# Patient Record
Sex: Female | Born: 1965 | Race: White | Hispanic: No | Marital: Married | Smoking: Never smoker
Health system: Southern US, Community
[De-identification: ages and names within clinical notes are randomized; demographics above are authoritative.]

## PROBLEM LIST (undated history)

## (undated) DIAGNOSIS — E559 Vitamin D deficiency, unspecified: Secondary | ICD-10-CM

## (undated) DIAGNOSIS — C55 Malignant neoplasm of uterus, part unspecified: Secondary | ICD-10-CM

## (undated) DIAGNOSIS — E785 Hyperlipidemia, unspecified: Secondary | ICD-10-CM

## (undated) DIAGNOSIS — I639 Cerebral infarction, unspecified: Secondary | ICD-10-CM

## (undated) DIAGNOSIS — I10 Essential (primary) hypertension: Secondary | ICD-10-CM

## (undated) HISTORY — DX: Essential (primary) hypertension: I10

## (undated) HISTORY — PX: BREAST LUMPECTOMY: SHX2

## (undated) HISTORY — DX: Vitamin D deficiency, unspecified: E55.9

## (undated) HISTORY — PX: BREAST EXCISIONAL BIOPSY: SUR124

## (undated) HISTORY — PX: ABDOMINAL HYSTERECTOMY: SHX81

## (undated) HISTORY — PX: LEFT OOPHORECTOMY: SHX1961

## (undated) HISTORY — DX: Malignant neoplasm of uterus, part unspecified: C55

## (undated) HISTORY — DX: Hyperlipidemia, unspecified: E78.5

## (undated) HISTORY — DX: Cerebral infarction, unspecified: I63.9

---

## 1985-05-15 DIAGNOSIS — C55 Malignant neoplasm of uterus, part unspecified: Secondary | ICD-10-CM

## 1985-05-15 HISTORY — DX: Malignant neoplasm of uterus, part unspecified: C55

## 2002-04-29 ENCOUNTER — Encounter: Payer: Self-pay | Admitting: Internal Medicine

## 2002-04-29 ENCOUNTER — Emergency Department (HOSPITAL_COMMUNITY): Admission: EM | Admit: 2002-04-29 | Discharge: 2002-04-29 | Payer: Self-pay | Admitting: Emergency Medicine

## 2012-06-25 ENCOUNTER — Encounter: Payer: Self-pay | Admitting: Gastroenterology

## 2012-07-08 ENCOUNTER — Telehealth: Payer: Self-pay | Admitting: Gastroenterology

## 2012-07-08 NOTE — Telephone Encounter (Signed)
Pt requesting to be seen sooner. Pt is having a lot of nausea and abdominal pain. Pt scheduled to see Dr. Arlyce Dice 07/11/12@9 :30am. Pt aware of appt date and time.

## 2012-07-11 ENCOUNTER — Ambulatory Visit (INDEPENDENT_AMBULATORY_CARE_PROVIDER_SITE_OTHER): Payer: PRIVATE HEALTH INSURANCE | Admitting: Gastroenterology

## 2012-07-11 ENCOUNTER — Encounter: Payer: Self-pay | Admitting: Gastroenterology

## 2012-07-11 VITALS — BP 138/96 | HR 80 | Ht 65.0 in | Wt 173.4 lb

## 2012-07-11 MED ORDER — OMEPRAZOLE 20 MG PO CPDR: 20.0000 mg | DELAYED_RELEASE_CAPSULE | Freq: Every day | ORAL | Status: DC

## 2012-07-11 MED ORDER — ONDANSETRON HCL 4 MG PO TABS: 4.0000 mg | ORAL_TABLET | Freq: Three times a day (TID) | ORAL | Status: DC | PRN

## 2012-07-11 NOTE — Patient Instructions (Addendum)
You have been scheduled for an abdominal ultrasound at [facility] Radiology (1st floor of hospital) on 08/09/2012 at 10:30am. Please arrive 15 minutes prior to your appointment for registration. Make certain not to have anything to eat or drink 6 hours prior to your appointment. Should you need to reschedule your appointment, please contact radiology at [phone]. This test typically takes about 30 minutes to perform.  Your prescription will be sent to your pharmacy

## 2012-07-11 NOTE — Progress Notes (Signed)
History of Present Illness: Pleasant 47 year old white female referred at the request of Dr. Charm Barges for evaluation of abdominal pain. Approximately 3 weeks ago she developed severe upper abdominal pain associated with nausea. Pain radiated slightly to the back. She was seen at [facility] ER where CT scan was unrevealing. Lab work was pertinent for white count of 13,000. LFTs were normal. She was given a GI cocktail and pain subsided over the next 48 hours. Associated with pain and nausea has been diarrhea. She tends to be constipated. Approximately week ago she had another episode of severe pain. This subsided spontaneously but she's had ongoing nausea since that time. Nausea is exacerbated by eating, but is chronic. She's had no fever or vomiting. There's been no change in her medications. She denies pyrosis. She's on no gastric irritants. She continues to have loose stools.  Family history is pertinent for a sister with colon cancer at age 63.    Past Medical History  Diagnosis Date  . Hyperlipemia   . Vitamin D deficiency disease   . Hypertension   . Osteoarthritis   . Depression   . Uterine cancer    Past Surgical History  Procedure Laterality Date  . Abdominal hysterectomy      due to CA  . Vaginal delivery      X2  . Left oophorectomy    . Breast lumpectomy Left    family history includes Colon cancer (age of onset: 1) in her sister; Colon polyps in her brother; Diabetes in her father; Heart disease in her father; Kidney disease in her father; and Ovarian cancer (age of onset: 38) in her paternal aunt. Current Outpatient Prescriptions  Medication Sig Dispense Refill  . budesonide-formoterol (SYMBICORT) 160-4.5 MCG/ACT inhaler Inhale 2 puffs into the lungs 2 (two) times daily as needed.       Marland Kitchen HYDROcodone-acetaminophen (NORCO) 10-325 MG per tablet Take 1 tablet by mouth every 6 (six) hours as needed for pain.      Marland Kitchen Linaclotide (LINZESS) 145 MCG CAPS Take by mouth daily.      .  promethazine (PHENERGAN) 25 MG tablet Take 25 mg by mouth every 6 (six) hours as needed for nausea.      Marland Kitchen telmisartan-hydrochlorothiazide (MICARDIS HCT) 40-12.5 MG per tablet Take 1 tablet by mouth daily.       No current facility-administered medications for this visit.   Allergies as of 07/11/2012 - Review Complete 07/11/2012  Allergen Reaction Noted  . Sulfa antibiotics  07/10/2012    reports that she has never smoked. She has never used smokeless tobacco. She reports that she does not drink alcohol or use illicit drugs.     Review of Systems: Pertinent positive and negative review of systems were noted in the above HPI section. All other review of systems were otherwise negative.  Vital signs were reviewed in today's medical record Physical Exam: General: Well developed , well nourished, no acute distress Skin: anicteric Head: Normocephalic and atraumatic Eyes:  sclerae anicteric, EOMI Ears: Normal auditory acuity Mouth: No deformity or lesions Neck: Supple, no masses or thyromegaly Lungs: Clear throughout to auscultation Heart: Regular rate and rhythm; no murmurs, rubs or bruits Abdomen: Soft, and non distended. No masses, hepatosplenomegaly or hernias noted. Normal Bowel sounds. There is mild tenderness in the upper epigastrium without guarding or rebound Rectal:deferred Musculoskeletal: Symmetrical with no gross deformities  Skin: No lesions on visible extremities Pulses:  Normal pulses noted Extremities: No clubbing, cyanosis, edema or deformities noted Neurological: Alert  oriented x 4, grossly nonfocal Cervical Nodes:  No significant cervical adenopathy Inguinal Nodes: No significant inguinal adenopathy Psychological:  Alert and cooperative. Normal mood and affect

## 2012-07-11 NOTE — Assessment & Plan Note (Signed)
Plan screening colonoscopy once upper GI issues are resolved  

## 2012-07-11 NOTE — Assessment & Plan Note (Addendum)
3 week history of intermittent severe upper epigastric pain associated with nausea and diarrhea. Despite negative CT I am suspicious that she may have biliary colic. This would be an unusual presentation for peptic ulcer disease or GERD.  Recommendations #1 abdominal ultrasound #2 HIDA scan if ultrasound is negative #3 to consider upper endoscopy if the above tests are not diagnostic #4 begin omeprazole 20 mg daily: Zofran when necessary

## 2012-07-12 ENCOUNTER — Ambulatory Visit (HOSPITAL_COMMUNITY)
Admission: RE | Admit: 2012-07-12 | Discharge: 2012-07-12 | Disposition: A | Discharge status: Home / Self Care | Payer: PRIVATE HEALTH INSURANCE | Source: Ambulatory Visit | Attending: Gastroenterology | Admitting: Gastroenterology

## 2012-07-12 ENCOUNTER — Telehealth: Payer: Self-pay | Admitting: Gastroenterology

## 2012-07-12 DIAGNOSIS — R109 Unspecified abdominal pain: Secondary | ICD-10-CM | POA: Insufficient documentation

## 2012-07-12 MED ORDER — HYOSCYAMINE SULFATE 0.125 MG SL SUBL: 0.2500 mg | SUBLINGUAL_TABLET | SUBLINGUAL | Status: DC | PRN

## 2012-07-12 NOTE — Telephone Encounter (Signed)
Spoke with pt and she is aware. See result note.

## 2012-07-15 ENCOUNTER — Telehealth: Payer: Self-pay | Admitting: Gastroenterology

## 2012-07-15 ENCOUNTER — Other Ambulatory Visit: Payer: Self-pay | Admitting: Gastroenterology

## 2012-07-15 NOTE — Telephone Encounter (Signed)
See result note. Pt aware.  

## 2012-07-22 ENCOUNTER — Ambulatory Visit: Payer: Self-pay | Admitting: Gastroenterology

## 2012-07-22 ENCOUNTER — Telehealth: Payer: Self-pay | Admitting: Gastroenterology

## 2012-07-22 NOTE — Telephone Encounter (Signed)
Spoke with patient and gave her [facility] radiology number to reschedule NM test.

## 2012-07-25 ENCOUNTER — Encounter (HOSPITAL_COMMUNITY): Payer: PRIVATE HEALTH INSURANCE

## 2012-07-31 ENCOUNTER — Encounter (HOSPITAL_COMMUNITY): Payer: PRIVATE HEALTH INSURANCE

## 2012-08-08 ENCOUNTER — Encounter (HOSPITAL_COMMUNITY)
Admission: RE | Admit: 2012-08-08 | Discharge: 2012-08-08 | Disposition: A | Discharge status: Home / Self Care | Payer: PRIVATE HEALTH INSURANCE | Source: Ambulatory Visit | Attending: Gastroenterology | Admitting: Gastroenterology

## 2012-08-08 DIAGNOSIS — R109 Unspecified abdominal pain: Secondary | ICD-10-CM | POA: Insufficient documentation

## 2012-08-08 MED ORDER — TECHNETIUM TC 99M MEBROFENIN IV KIT
5.5000 | PACK | Freq: Once | INTRAVENOUS | Status: AC | PRN
  Administered 2012-08-08: 6 via INTRAVENOUS

## 2012-08-09 ENCOUNTER — Telehealth: Payer: Self-pay | Admitting: Gastroenterology

## 2012-08-09 NOTE — Telephone Encounter (Signed)
See result note. Pt scheduled for EGD 09/24/12@10 :30am, previsit scheduled for 09/09/12@4pm . Pt aware of appt dates and times.

## 2012-08-09 NOTE — Telephone Encounter (Signed)
Left message for pt to call back  °

## 2012-10-09 ENCOUNTER — Telehealth: Payer: Self-pay | Admitting: Gastroenterology

## 2012-10-09 NOTE — Telephone Encounter (Signed)
Left message for pt to call back  °

## 2012-10-11 NOTE — Telephone Encounter (Signed)
Left message for pt to call back  °

## 2012-10-14 NOTE — Telephone Encounter (Signed)
Left message for pt to call back.  After multiple attempts have been unable to reach patient.

## 2012-10-16 ENCOUNTER — Telehealth: Payer: Self-pay | Admitting: Gastroenterology

## 2012-10-16 ENCOUNTER — Ambulatory Visit: Payer: PRIVATE HEALTH INSURANCE | Admitting: Gastroenterology

## 2012-10-16 NOTE — Telephone Encounter (Signed)
Pt was a no show

## 2012-10-16 NOTE — Telephone Encounter (Signed)
Please send note for f/u 

## 2012-10-18 ENCOUNTER — Encounter: Payer: Self-pay | Admitting: Gastroenterology

## 2012-10-18 NOTE — Telephone Encounter (Signed)
MAILED LETTER °

## 2013-03-20 ENCOUNTER — Other Ambulatory Visit: Payer: Self-pay

## 2013-04-07 ENCOUNTER — Other Ambulatory Visit: Payer: Self-pay | Admitting: Obstetrics & Gynecology

## 2013-10-27 ENCOUNTER — Emergency Department (HOSPITAL_COMMUNITY)
Admission: EM | Admit: 2013-10-27 | Discharge: 2013-10-27 | Disposition: A | Discharge status: Home / Self Care | Payer: 59 | Attending: Emergency Medicine | Admitting: Emergency Medicine

## 2013-10-27 ENCOUNTER — Encounter (HOSPITAL_COMMUNITY): Payer: Self-pay | Admitting: Emergency Medicine

## 2013-10-27 ENCOUNTER — Emergency Department (HOSPITAL_COMMUNITY): Payer: 59

## 2013-10-27 DIAGNOSIS — M545 Low back pain, unspecified: Secondary | ICD-10-CM | POA: Insufficient documentation

## 2013-10-27 DIAGNOSIS — Z8659 Personal history of other mental and behavioral disorders: Secondary | ICD-10-CM | POA: Diagnosis not present

## 2013-10-27 DIAGNOSIS — Z8542 Personal history of malignant neoplasm of other parts of uterus: Secondary | ICD-10-CM | POA: Diagnosis not present

## 2013-10-27 DIAGNOSIS — R358 Other polyuria: Secondary | ICD-10-CM | POA: Insufficient documentation

## 2013-10-27 DIAGNOSIS — Z9079 Acquired absence of other genital organ(s): Secondary | ICD-10-CM | POA: Insufficient documentation

## 2013-10-27 DIAGNOSIS — R3589 Other polyuria: Secondary | ICD-10-CM | POA: Diagnosis not present

## 2013-10-27 DIAGNOSIS — I10 Essential (primary) hypertension: Secondary | ICD-10-CM | POA: Insufficient documentation

## 2013-10-27 DIAGNOSIS — Z862 Personal history of diseases of the blood and blood-forming organs and certain disorders involving the immune mechanism: Secondary | ICD-10-CM | POA: Insufficient documentation

## 2013-10-27 DIAGNOSIS — R1011 Right upper quadrant pain: Secondary | ICD-10-CM | POA: Insufficient documentation

## 2013-10-27 DIAGNOSIS — Z90710 Acquired absence of both cervix and uterus: Secondary | ICD-10-CM | POA: Insufficient documentation

## 2013-10-27 DIAGNOSIS — Z8639 Personal history of other endocrine, nutritional and metabolic disease: Secondary | ICD-10-CM | POA: Insufficient documentation

## 2013-10-27 DIAGNOSIS — M1990 Unspecified osteoarthritis, unspecified site: Secondary | ICD-10-CM | POA: Insufficient documentation

## 2013-10-27 DIAGNOSIS — Z79899 Other long term (current) drug therapy: Secondary | ICD-10-CM | POA: Insufficient documentation

## 2013-10-27 DIAGNOSIS — R1032 Left lower quadrant pain: Secondary | ICD-10-CM | POA: Insufficient documentation

## 2013-10-27 DIAGNOSIS — M549 Dorsalgia, unspecified: Secondary | ICD-10-CM | POA: Diagnosis present

## 2013-10-27 LAB — URINALYSIS, ROUTINE W REFLEX MICROSCOPIC
Bilirubin Urine: NEGATIVE
Glucose, UA: NEGATIVE mg/dL
Hgb urine dipstick: NEGATIVE
Ketones, ur: NEGATIVE mg/dL
Leukocytes, UA: NEGATIVE
Nitrite: NEGATIVE
Protein, ur: NEGATIVE mg/dL
Specific Gravity, Urine: 1.02 (ref 1.005–1.030)
Urobilinogen, UA: 2 mg/dL — ABNORMAL HIGH (ref 0.0–1.0)
pH: 7 (ref 5.0–8.0)

## 2013-10-27 LAB — COMPREHENSIVE METABOLIC PANEL
ALT: 17 U/L (ref 0–35)
AST: 16 U/L (ref 0–37)
Albumin: 4 g/dL (ref 3.5–5.2)
Alkaline Phosphatase: 84 U/L (ref 39–117)
BUN: 11 mg/dL (ref 6–23)
CO2: 27 mEq/L (ref 19–32)
Calcium: 9.2 mg/dL (ref 8.4–10.5)
Chloride: 102 mEq/L (ref 96–112)
Creatinine, Ser: 0.69 mg/dL (ref 0.50–1.10)
GFR calc Af Amer: >90 mL/min (ref >90)
GFR calc non Af Amer: >90 mL/min (ref >90)
Glucose, Bld: 90 mg/dL (ref 70–99)
Potassium: 3.8 mEq/L (ref 3.7–5.3)
Sodium: 142 mEq/L (ref 137–147)
Total Bilirubin: 0.2 mg/dL — ABNORMAL LOW (ref 0.3–1.2)
Total Protein: 7.7 g/dL (ref 6.0–8.3)

## 2013-10-27 LAB — CBC WITH DIFFERENTIAL/PLATELET
BASOS ABS: 0 10*3/uL (ref 0.0–0.1)
Basophils Relative: 0 % (ref 0–1)
Eosinophils Absolute: 0.1 10*3/uL (ref 0.0–0.7)
Eosinophils Relative: 1 % (ref 0–5)
HEMATOCRIT: 39.5 % (ref 36.0–46.0)
Hemoglobin: 13.7 g/dL (ref 12.0–15.0)
LYMPHS PCT: 28 % (ref 12–46)
Lymphs Abs: 2.8 10*3/uL (ref 0.7–4.0)
MCH: 31.9 pg (ref 26.0–34.0)
MCHC: 34.7 g/dL (ref 30.0–36.0)
MCV: 92.1 fL (ref 78.0–100.0)
Monocytes Absolute: 0.8 10*3/uL (ref 0.1–1.0)
Monocytes Relative: 8 % (ref 3–12)
NEUTROS ABS: 6 10*3/uL (ref 1.7–7.7)
NEUTROS PCT: 63 % (ref 43–77)
PLATELETS: 328 10*3/uL (ref 150–400)
RBC: 4.29 MIL/uL (ref 3.87–5.11)
RDW: 13.2 % (ref 11.5–15.5)
WBC: 9.7 10*3/uL (ref 4.0–10.5)

## 2013-10-27 LAB — LIPASE, BLOOD: Lipase: 25 U/L (ref 11–59)

## 2013-10-27 MED ORDER — OXYCODONE-ACETAMINOPHEN 5-325 MG PO TABS: 1.0000 | ORAL_TABLET | ORAL | Status: DC | PRN

## 2013-10-27 MED ORDER — IOHEXOL 300 MG/ML  SOLN
100.0000 mL | Freq: Once | INTRAMUSCULAR | Status: AC | PRN
  Administered 2013-10-27: 100 mL via INTRAVENOUS

## 2013-10-27 MED ORDER — IOHEXOL 300 MG/ML  SOLN
50.0000 mL | Freq: Once | INTRAMUSCULAR | Status: AC | PRN
  Administered 2013-10-27: 50 mL via ORAL

## 2013-10-27 MED ORDER — ONDANSETRON 8 MG PO TBDP
8.0000 mg | ORAL_TABLET | Freq: Once | ORAL | Status: AC
  Administered 2013-10-27: 8 mg via ORAL
  Filled 2013-10-27: qty 1

## 2013-10-27 MED ORDER — MORPHINE SULFATE 4 MG/ML IJ SOLN
4.0000 mg | Freq: Once | INTRAMUSCULAR | Status: AC
  Administered 2013-10-27: 4 mg via INTRAVENOUS
  Filled 2013-10-27: qty 1

## 2013-10-27 MED ORDER — CYCLOBENZAPRINE HCL 5 MG PO TABS: 5.0000 mg | ORAL_TABLET | Freq: Three times a day (TID) | ORAL | Status: DC | PRN

## 2013-10-27 NOTE — Discharge Instructions (Signed)
Back Pain, Adult Low back pain is very common. About 1 in 5 people have back pain.The cause of low back pain is rarely dangerous. The pain often gets better over time.About half of people with a sudden onset of back pain feel better in just 2 weeks. About 8 in 10 people feel better by 6 weeks.  CAUSES Some common causes of back pain include:  Strain of the muscles or ligaments supporting the spine.  Wear and tear (degeneration) of the spinal discs.  Arthritis.  Direct injury to the back. DIAGNOSIS Most of the time, the direct cause of low back pain is not known.However, back pain can be treated effectively even when the exact cause of the pain is unknown.Answering your caregiver's questions about your overall health and symptoms is one of the most accurate ways to make sure the cause of your pain is not dangerous. If your caregiver needs more information, he or she may order lab work or imaging tests (X-rays or MRIs).However, even if imaging tests show changes in your back, this usually does not require surgery. HOME CARE INSTRUCTIONS For many people, back pain returns.Since low back pain is rarely dangerous, it is often a condition that people can learn to [facility] their own.   Remain active. It is stressful on the back to sit or stand in one place. Do not sit, drive, or stand in one place for more than 30 minutes at a time. Take short walks on level surfaces as soon as pain allows.Try to increase the length of time you walk each day.  Do not stay in bed.Resting more than 1 or 2 days can delay your recovery.  Do not avoid exercise or work.Your body is made to move.It is not dangerous to be active, even though your back may hurt.Your back will likely heal faster if you return to being active before your pain is gone.  Pay attention to your body when you bend and lift. Many people have less discomfortwhen lifting if they bend their knees, keep the load close to their bodies,and  avoid twisting. Often, the most comfortable positions are those that put less stress on your recovering back.  Find a comfortable position to sleep. Use a firm mattress and lie on your side with your knees slightly bent. If you lie on your back, put a pillow under your knees.  Only take over-the-counter or prescription medicines as directed by your caregiver. Over-the-counter medicines to reduce pain and inflammation are often the most helpful.Your caregiver may prescribe muscle relaxant drugs.These medicines help dull your pain so you can more quickly return to your normal activities and healthy exercise.  Put ice on the injured area.  Put ice in a plastic bag.  Place a towel between your skin and the bag.  Leave the ice on for 15-20 minutes, 03-04 times a day for the first 2 to 3 days. After that, ice and heat may be alternated to reduce pain and spasms.  Ask your caregiver about trying back exercises and gentle massage. This may be of some benefit.  Avoid feeling anxious or stressed.Stress increases muscle tension and can worsen back pain.It is important to recognize when you are anxious or stressed and learn ways to manage it.Exercise is a great option. SEEK MEDICAL CARE IF:  You have pain that is not relieved with rest or medicine.  You have pain that does not improve in 1 week.  You have new symptoms.  You are generally not feeling well. SEEK  IMMEDIATE MEDICAL CARE IF:   You have pain that radiates from your back into your legs.  You develop new bowel or bladder control problems.  You have unusual weakness or numbness in your arms or legs.  You develop nausea or vomiting.  You develop abdominal pain.  You feel faint. Document Released: 05/01/2005 Document Revised: 10/31/2011 Document Reviewed: 09/19/2010 [facility] Patient Information 2014 [place], [place].  Your labs and CT scan are normal tonight.  I suspect your pain is musculoskeletal in nature.  You may use the  medicines prescribed for pain and possible muscle spasm.  I also suggest using a heating pad 20 minutes 3-4 times daily.

## 2013-10-27 NOTE — ED Provider Notes (Signed)
CSN: [id number]     Arrival date & time 10/27/13  1842 History  This chart was scribed for non-physician practitioner, Evalee Jefferson, PA-C working with Alfonzo Feller, DO by Einar Pheasant, ED scribe. This patient was seen in room APFT21/APFT21 and the patient's care was started at 7:46 PM.    Chief Complaint  Patient presents with  . Back Pain   The history is provided by the patient. No language interpreter was used.   HPI Comments: LORAN AUGUSTE is a 48 y.o. female who presents to the Emergency Department complaining of worsening sudden onset left sided back pain that started 1 weeks ago. Pt states that she tried to see her PCP but she was not in the office so she had to be seen at the local Urgent Care where she was treated with flexeril and hydrocodone without relief of symptoms. She states that the pain is constant at rest but also worsened by movement. [name] describes the pain as "achy". Pt states that the pain is worsened by getting up from a position that she's been in for extended periods of times. She is also complaining of associated nausea whichs she believes is secondary to the back pain. Pt denies any dysuria or hematuria, but her urine has been darker than normal and she has been urinating more frequently, no history of kidney stones, SOB, decreased appetite, bowel or bladder incontinence.  She denies drinking alcohol. No history of DM.  Pt has a history of hypertension. She states that she hasn't taken her BP medication today.   Pt has a history of uterine cancer. She also has a history of abdominal hysterectomy and left oophorectomy.   Last normal bowel movement: this morning   Past Medical History  Diagnosis Date  . Hyperlipemia   . Vitamin D deficiency disease   . Hypertension   . Osteoarthritis   . Depression   . Uterine cancer    Past Surgical History  Procedure Laterality Date  . Abdominal hysterectomy      due to CA  . Vaginal delivery      X2  . Left  oophorectomy    . Breast lumpectomy Left    Family History  Problem Relation Age of Onset  . Ovarian cancer Paternal Aunt 64  . Colon cancer Sister 30  . Colon polyps Brother   . Diabetes Father   . Heart disease Father   . Kidney disease Father    History  Substance Use Topics  . Smoking status: Never Smoker   . Smokeless tobacco: Never Used  . Alcohol Use: No   OB History   Grav Para Term Preterm Abortions TAB SAB Ect Mult Living                 Review of Systems  Respiratory: Negative for shortness of breath.   Cardiovascular: Negative for leg swelling.  Gastrointestinal: Negative for constipation and abdominal distention.  Genitourinary: Positive for frequency. Negative for urgency, flank pain and difficulty urinating.  Musculoskeletal: Positive for back pain. Negative for gait problem and joint swelling.  Skin: Negative for rash.  All other systems reviewed and are negative.  Allergies  Sulfa antibiotics  Home Medications   Prior to Admission medications   Medication Sig Start Date End Date Taking? Authorizing Provider  cyclobenzaprine (FLEXERIL) 10 MG tablet Take 10 mg by mouth 3 (three) times daily as needed. For muscle spasms 10/26/13  Yes Historical Provider, MD  HYDROcodone-acetaminophen (Veguita) 10-325 MG per  tablet Take 1 tablet by mouth every 6 (six) hours as needed for pain.   Yes Historical Provider, MD  telmisartan-hydrochlorothiazide (MICARDIS HCT) 40-12.5 MG per tablet Take 1 tablet by mouth daily.   Yes Historical Provider, MD  oxyCODONE-acetaminophen (PERCOCET/ROXICET) 5-325 MG per tablet Take 1 tablet by mouth every 4 (four) hours as needed for severe pain. 10/27/13   Evalee Jefferson, PA-C   Triage Vitals: BP 178/104  Pulse 96  Temp(Src) 97.9 F (36.6 C) (Oral)  Resp 20  Ht 5\' 6"  (1.676 m)  Wt 172 lb (78.019 kg)  BMI 27.77 kg/m2  SpO2 97%  Physical Exam  Nursing note and vitals reviewed. Constitutional: She appears well-developed and  well-nourished.  HENT:  Head: Normocephalic.  Eyes: Conjunctivae are normal.  Neck: Normal range of motion. Neck supple.  Cardiovascular: Normal rate and intact distal pulses.   Pedal pulses normal.  Pulmonary/Chest: Effort normal.  Abdominal: Soft. Bowel sounds are normal. She exhibits no distension and no mass. There is no hepatosplenomegaly. There is tenderness in the left upper quadrant and left lower quadrant. There is no rebound and no guarding.  Musculoskeletal: Normal range of motion. She exhibits no edema.       Lumbar back: She exhibits tenderness. She exhibits no swelling, no edema and no spasm.  Tenderness to left flank and left paralumbar with no midline lumbar pain.  Neurological: She is alert. She has normal strength. She displays no atrophy and no tremor. No sensory deficit. Gait normal.  No strength deficit noted in hip and knee flexor and extensor muscle groups.  Ankle flexion and extension intact. Normal gait  Skin: Skin is warm and dry.  Psychiatric: She has a normal mood and affect.    ED Course  Procedures (including critical care time)  DIAGNOSTIC STUDIES: Oxygen Saturation is 97% on RA, adequate by my interpretation.    COORDINATION OF CARE: 7:54 PM- Pt advised of plan for treatment and pt agrees. Will order an abdominal CT scan. Will also order a UA.    Labs Review Labs Reviewed  COMPREHENSIVE METABOLIC PANEL - Abnormal; Notable for the following:    Total Bilirubin 0.2 (*)    All other components within normal limits  URINALYSIS, ROUTINE W REFLEX MICROSCOPIC - Abnormal; Notable for the following:    Urobilinogen, UA 2.0 (*)    All other components within normal limits  LIPASE, BLOOD  CBC WITH DIFFERENTIAL    Imaging Review Ct Abdomen Pelvis W Contrast  10/27/2013   CLINICAL DATA:  Left upper and left lower quadrant pain. Hysterectomy. Hysterectomy. Personal history of uterine cancer. Abdominal and flank pain.  EXAM: CT ABDOMEN AND PELVIS WITH  CONTRAST  TECHNIQUE: Multidetector CT imaging of the abdomen and pelvis was performed using the standard protocol following bolus administration of intravenous contrast.  CONTRAST:  55mL OMNIPAQUE IOHEXOL 300 MG/ML SOLN, 147mL OMNIPAQUE IOHEXOL 300 MG/ML SOLN  COMPARISON:  None.  FINDINGS: Bones: L5-S1 degenerative disc disease. No aggressive osseous lesions.  Lung Bases: Normal.  Liver:  Normal.  Spleen:  Normal.  Gallbladder:  Normal.  Common bile duct:  Normal.  Pancreas:  Normal.  Adrenal glands:  Normal bilaterally.  Kidneys: Normal enhancement and delayed excretion of contrast. Both ureters appear within normal limits. No renal calculi.  Stomach:  Normal.  Small bowel:  Normal.  Colon: Probable normal appendix dorsal to the cecum. No colonic inflammatory changes.  Pelvic Genitourinary: Hysterectomy. Right ovarian cysts. Urinary bladder appears normal. No free fluid.  Vasculature: Atherosclerosis.  Body Wall: Normal.  IMPRESSION: 1. No acute abnormality. 2. Hysterectomy.   Electronically Signed   By: Dereck Ligas M.D.   On: 10/27/2013 22:11     EKG Interpretation None      MDM   Final diagnoses:  Low back pain    Patients labs and/or radiological studies were viewed and considered during the medical decision making and disposition process.  Lab and Ct findings discussed with patient.  She does not appear to have ureteral stone/infection as the source of her sx.  No apparent metastaic bone disease.  She was given IV morphine while here with resolution of sx.  She was prescribed oxycodone in place of hydrocodone.  Encouraged ibuprofen otc.  Heat tx.  Work note given.  F/u with pcp prn persistent sx.  No neuro deficit on exam or by history to suggest emergent or surgical presentation.  Also discussed worsened sx that should prompt immediate re-evaluation including distal weakness, bowel/bladder retention/incontinence.   I personally performed the services described in this documentation,  which was scribed in my presence. The recorded information has been reviewed and is accurate.     Evalee Jefferson, PA-C 10/28/13 1356

## 2013-10-27 NOTE — ED Notes (Addendum)
Left lower back pain, can't lay flat, denies injury, pain also in left hip, hurts more with movement, seen by PCP last week, given muscle relaxers without relief

## 2013-10-28 NOTE — ED Provider Notes (Signed)
Medical screening examination/treatment/procedure(s) were performed by non-physician practitioner and as supervising physician I was immediately available for consultation/collaboration.   EKG Interpretation None        Alfonzo Feller, DO 10/28/13 1611

## 2014-02-27 ENCOUNTER — Other Ambulatory Visit: Payer: Self-pay

## 2014-05-09 ENCOUNTER — Emergency Department (HOSPITAL_COMMUNITY): Admission: EM | Admit: 2014-05-09 | Discharge: 2014-05-09 | Discharge status: Against Medical Advice | Payer: 59

## 2014-05-11 ENCOUNTER — Encounter (HOSPITAL_COMMUNITY): Payer: Self-pay | Admitting: Emergency Medicine

## 2014-05-11 ENCOUNTER — Emergency Department (HOSPITAL_COMMUNITY)
Admission: EM | Admit: 2014-05-11 | Discharge: 2014-05-11 | Disposition: A | Discharge status: Home / Self Care | Payer: 59 | Attending: Emergency Medicine | Admitting: Emergency Medicine

## 2014-05-11 ENCOUNTER — Emergency Department (HOSPITAL_COMMUNITY): Payer: 59

## 2014-05-11 DIAGNOSIS — M1990 Unspecified osteoarthritis, unspecified site: Secondary | ICD-10-CM | POA: Insufficient documentation

## 2014-05-11 DIAGNOSIS — Z8639 Personal history of other endocrine, nutritional and metabolic disease: Secondary | ICD-10-CM | POA: Insufficient documentation

## 2014-05-11 DIAGNOSIS — Z79899 Other long term (current) drug therapy: Secondary | ICD-10-CM | POA: Insufficient documentation

## 2014-05-11 DIAGNOSIS — R05 Cough: Secondary | ICD-10-CM | POA: Diagnosis present

## 2014-05-11 DIAGNOSIS — J069 Acute upper respiratory infection, unspecified: Secondary | ICD-10-CM

## 2014-05-11 DIAGNOSIS — R059 Cough, unspecified: Secondary | ICD-10-CM

## 2014-05-11 DIAGNOSIS — Z8542 Personal history of malignant neoplasm of other parts of uterus: Secondary | ICD-10-CM | POA: Insufficient documentation

## 2014-05-11 DIAGNOSIS — Z8659 Personal history of other mental and behavioral disorders: Secondary | ICD-10-CM | POA: Insufficient documentation

## 2014-05-11 DIAGNOSIS — I10 Essential (primary) hypertension: Secondary | ICD-10-CM | POA: Insufficient documentation

## 2014-05-11 MED ORDER — ALBUTEROL SULFATE HFA 108 (90 BASE) MCG/ACT IN AERS: 2.0000 | INHALATION_SPRAY | RESPIRATORY_TRACT | Status: DC | PRN

## 2014-05-11 MED ORDER — IBUPROFEN 800 MG PO TABS: 800.0000 mg | ORAL_TABLET | Freq: Three times a day (TID) | ORAL | Status: DC | PRN

## 2014-05-11 MED ORDER — GUAIFENESIN-CODEINE 100-10 MG/5ML PO SOLN: 5.0000 mL | ORAL | Status: DC | PRN

## 2014-05-11 MED ORDER — ONDANSETRON 4 MG PO TBDP: 4.0000 mg | ORAL_TABLET | Freq: Three times a day (TID) | ORAL | Status: DC | PRN

## 2014-05-11 NOTE — Discharge Instructions (Signed)
Upper Respiratory Infection, Adult An upper respiratory infection (URI) is also sometimes known as the common cold. The upper respiratory tract includes the nose, sinuses, throat, trachea, and bronchi. Bronchi are the airways leading to the lungs. Most people improve within 1 week, but symptoms can last up to 2 weeks. A residual cough may last even longer.  CAUSES Many different viruses can infect the tissues lining the upper respiratory tract. The tissues become irritated and inflamed and often become very moist. Mucus production is also common. A cold is contagious. You can easily spread the virus to others by oral contact. This includes kissing, sharing a glass, coughing, or sneezing. Touching your mouth or nose and then touching a surface, which is then touched by another person, can also spread the virus. SYMPTOMS  Symptoms typically develop 1 to 3 days after you come in contact with a cold virus. Symptoms vary from person to person. They may include:  Runny nose.  Sneezing.  Nasal congestion.  Sinus irritation.  Sore throat.  Loss of voice (laryngitis).  Cough.  Fatigue.  Muscle aches.  Loss of appetite.  Headache.  Low-grade fever. DIAGNOSIS  You might diagnose your own cold based on familiar symptoms, since most people get a cold 2 to 3 times a year. Your caregiver can confirm this based on your exam. Most importantly, your caregiver can check that your symptoms are not due to another disease such as strep throat, sinusitis, pneumonia, asthma, or epiglottitis. Blood tests, throat tests, and X-rays are not necessary to diagnose a common cold, but they may sometimes be helpful in excluding other more serious diseases. Your caregiver will decide if any further tests are required. RISKS AND COMPLICATIONS  You may be at risk for a more severe case of the common cold if you smoke cigarettes, have chronic heart disease (such as heart failure) or lung disease (such as asthma), or if  you have a weakened immune system. The very young and very old are also at risk for more serious infections. Bacterial sinusitis, middle ear infections, and bacterial pneumonia can complicate the common cold. The common cold can worsen asthma and chronic obstructive pulmonary disease (COPD). Sometimes, these complications can require emergency medical care and may be life-threatening. PREVENTION  The best way to protect against getting a cold is to practice good hygiene. Avoid oral or hand contact with people with cold symptoms. Wash your hands often if contact occurs. There is no clear evidence that vitamin C, vitamin E, echinacea, or exercise reduces the chance of developing a cold. However, it is always recommended to get plenty of rest and practice good nutrition. TREATMENT  Treatment is directed at relieving symptoms. There is no cure. Antibiotics are not effective, because the infection is caused by a virus, not by bacteria. Treatment may include:  Increased fluid intake. Sports drinks offer valuable electrolytes, sugars, and fluids.  Breathing heated mist or steam (vaporizer or shower).  Eating chicken soup or other clear broths, and maintaining good nutrition.  Getting plenty of rest.  Using gargles or lozenges for comfort.  Controlling fevers with ibuprofen or acetaminophen as directed by your caregiver.  Increasing usage of your inhaler if you have asthma. Zinc gel and zinc lozenges, taken in the first 24 hours of the common cold, can shorten the duration and lessen the severity of symptoms. Pain medicines may help with fever, muscle aches, and throat pain. A variety of non-prescription medicines are available to treat congestion and runny nose. Your caregiver   can make recommendations and may suggest nasal or lung inhalers for other symptoms.  HOME CARE INSTRUCTIONS   Only take over-the-counter or prescription medicines for pain, discomfort, or fever as directed by your  caregiver.  Use a warm mist humidifier or inhale steam from a shower to increase air moisture. This may keep secretions moist and make it easier to breathe.  Drink enough water and fluids to keep your urine clear or pale yellow.  Rest as needed.  Return to work when your temperature has returned to normal or as your caregiver advises. You may need to stay home longer to avoid infecting others. You can also use a face mask and careful hand washing to prevent spread of the virus. SEEK MEDICAL CARE IF:   After the first few days, you feel you are getting worse rather than better.  You need your caregiver's advice about medicines to control symptoms.  You develop chills, worsening shortness of breath, or brown or red sputum. These may be signs of pneumonia.  You develop yellow or brown nasal discharge or pain in the face, especially when you bend forward. These may be signs of sinusitis.  You develop a fever, swollen neck glands, pain with swallowing, or white areas in the back of your throat. These may be signs of strep throat. SEEK IMMEDIATE MEDICAL CARE IF:   You have a fever.  You develop severe or persistent headache, ear pain, sinus pain, or chest pain.  You develop wheezing, a prolonged cough, cough up blood, or have a change in your usual mucus (if you have chronic lung disease).  You develop sore muscles or a stiff neck. Document Released: 10/25/2000 Document Revised: 07/24/2011 Document Reviewed: 08/06/2013 [employer] Patient Information 2015 [employer]. This information is not intended to replace advice given to you by your health care provider. Make sure you discuss any questions you have with your health care provider.  

## 2014-05-11 NOTE — ED Notes (Addendum)
Pt reports diagnosed with pnuemonia x3 days ago. nad noted. Pt reports no improvement and continued generalized body aches and back pain. Dry cough noted in triage. Pt reports back pain worse with movement and coughing.

## 2014-05-11 NOTE — ED Provider Notes (Signed)
TIME SEEN: 1:15 PM  CHIEF COMPLAINT: Subjective fever, cough, body aches  HPI: Pt is a 48 y.o. F with history of hyperlipidemia, hypertension who presents to the emergency department with complaints of 3 days of cough with yellow sputum production, fever has been subjective, body aches. [facility] on azithromycin and Omnicef. Reports no improvement of her symptoms. States she did have an influenza vaccination this year. States her grandson has similar symptoms. She's had nausea but no vomiting or diarrhea. No headache, neck pain or neck stiffness. No recent travel.  ROS: See HPI Constitutional:  fever  Eyes: no drainage  ENT:  runny nose   Cardiovascular:  no chest pain  Resp: no SOB  GI: no vomiting GU: no dysuria Integumentary: no rash  Allergy: no hives  Musculoskeletal: no leg swelling  Neurological: no slurred speech ROS otherwise negative  PAST MEDICAL HISTORY/PAST SURGICAL HISTORY:  Past Medical History  Diagnosis Date  . Hyperlipemia   . Vitamin D deficiency disease   . Hypertension   . Osteoarthritis   . Depression   . Uterine cancer     MEDICATIONS:  Prior to Admission medications   Medication Sig Start Date End Date Taking? Authorizing Provider  cefdinir (OMNICEF) 300 MG capsule Take 300 mg by mouth 2 (two) times daily. Started 05/09/14 05/09/14  Yes Historical Provider, MD  telmisartan-hydrochlorothiazide (MICARDIS HCT) 40-12.5 MG per tablet Take 1 tablet by mouth daily.   Yes Historical Provider, MD  HYDROcodone-acetaminophen (NORCO) 10-325 MG per tablet Take 1 tablet by mouth every 6 (six) hours as needed. 05/03/14   Historical Provider, MD  oxyCODONE-acetaminophen (PERCOCET/ROXICET) 5-325 MG per tablet Take 1 tablet by mouth every 4 (four) hours as needed for severe pain. Patient not taking: Reported on 05/11/2014 10/27/13   Evalee Jefferson, PA-C    ALLERGIES:  Allergies  Allergen Reactions  . Sulfa Antibiotics Rash     SOCIAL HISTORY:  History  Substance Use Topics  . Smoking status: Never Smoker   . Smokeless tobacco: Never Used  . Alcohol Use: No    FAMILY HISTORY: Family History  Problem Relation Age of Onset  . Ovarian cancer Paternal Aunt 95  . Colon cancer Sister 16  . Colon polyps Brother   . Diabetes Father   . Heart disease Father   . Kidney disease Father     EXAM: BP 153/90 mmHg  Pulse 83  Temp(Src) 98.3 F (36.8 C) (Oral)  Resp 16  Ht 5\' 6"  (1.676 m)  Wt 178 lb (80.74 kg)  BMI 28.74 kg/m2  SpO2 98% CONSTITUTIONAL: Alert and oriented and responds appropriately to questions. Well-appearing; well-nourished, appears uncomfortable but nontoxic HEAD: Normocephalic EYES: Conjunctivae clear, PERRL ENT: normal nose; no rhinorrhea; moist mucous membranes; pharynx without lesions noted NECK: Supple, no meningismus, no LAD  CARD: RRR; S1 and S2 appreciated; no murmurs, no clicks, no rubs, no gallops RESP: Normal chest excursion without splinting or tachypnea; breath sounds clear and equal bilaterally; no wheezes, no rhonchi, no rales, no hypoxia or respiratory distress, speaking full sentences ABD/GI: Normal bowel sounds; non-distended; soft, non-tender, no rebound, no guarding BACK:  The back appears normal and is non-tender to palpation, there is no CVA tenderness EXT: Normal ROM in all joints; non-tender to palpation; no edema; normal capillary refill; no cyanosis    SKIN: Normal color for age and race; warm NEURO: Moves all extremities equally PSYCH: The patient's mood and manner are appropriate. Grooming and personal  hygiene are appropriate.  MEDICAL DECISION MAKING: Patient here with viral URI symptoms. Chest x-ray clear. Lungs clear to auscultation with good aeration. No hypoxia or respiratory distress. Discussed with patient that the reason she is not improving on antibiotics is likely because this is viral in nature. Advised her to increase fluid intake, alternate: Motrin.  We'll discharge with prescription for albuterol inhaler to use as needed and guaifenesin with codeine. Discussed return precautions. Provided work note. Patient verbalizes understanding is comfortable with this plan.       [place], DO 05/11/14 [phone]

## 2015-01-11 ENCOUNTER — Encounter: Payer: Self-pay | Admitting: Cardiovascular Disease

## 2015-01-11 ENCOUNTER — Encounter: Payer: Self-pay | Admitting: *Deleted

## 2015-01-12 NOTE — Progress Notes (Signed)
This encounter was created in error - please disregard.

## 2015-01-25 ENCOUNTER — Other Ambulatory Visit: Payer: Self-pay | Admitting: Cardiology

## 2015-01-25 ENCOUNTER — Encounter: Payer: Self-pay | Admitting: Cardiology

## 2015-01-25 ENCOUNTER — Encounter: Payer: Self-pay | Admitting: *Deleted

## 2015-01-25 ENCOUNTER — Ambulatory Visit (INDEPENDENT_AMBULATORY_CARE_PROVIDER_SITE_OTHER): Payer: BLUE CROSS/BLUE SHIELD | Admitting: Cardiology

## 2015-01-25 VITALS — BP 145/87 | HR 67 | Ht 66.0 in | Wt 186.4 lb

## 2015-01-25 DIAGNOSIS — R079 Chest pain, unspecified: Secondary | ICD-10-CM | POA: Diagnosis not present

## 2015-01-25 LAB — CBC
HCT: 33.3 % — ABNORMAL LOW (ref 36.0–46.0)
HEMOGLOBIN: 11 g/dL — AB (ref 12.0–15.0)
MCH: 32 pg (ref 26.0–34.0)
MCHC: 33 g/dL (ref 30.0–36.0)
MCV: 96.8 fL (ref 78.0–100.0)
MPV: 9.8 fL (ref 8.6–12.4)
Platelets: 299 10*3/uL (ref 150–400)
RBC: 3.44 MIL/uL — AB (ref 3.87–5.11)
RDW: 13.7 % (ref 11.5–15.5)
WBC: 8.7 10*3/uL (ref 4.0–10.5)

## 2015-01-25 LAB — COMPREHENSIVE METABOLIC PANEL
ALBUMIN: 3.8 g/dL (ref 3.6–5.1)
ALK PHOS: 73 U/L (ref 33–115)
ALT: 27 U/L (ref 6–29)
AST: 20 U/L (ref 10–35)
BILIRUBIN TOTAL: 0.5 mg/dL (ref 0.2–1.2)
BUN: 11 mg/dL (ref 7–25)
CHLORIDE: 102 mmol/L (ref 98–110)
CO2: 29 mmol/L (ref 20–31)
CREATININE: 0.67 mg/dL (ref 0.50–1.10)
Calcium: 8.8 mg/dL (ref 8.6–10.2)
Glucose, Bld: 77 mg/dL (ref 65–99)
Potassium: 4.2 mmol/L (ref 3.5–5.3)
SODIUM: 138 mmol/L (ref 135–146)
TOTAL PROTEIN: 6.8 g/dL (ref 6.1–8.1)

## 2015-01-25 LAB — MAGNESIUM: MAGNESIUM: 2 mg/dL (ref 1.5–2.5)

## 2015-01-25 NOTE — Patient Instructions (Signed)
Your physician recommends that you schedule a follow-up appointment in: to be determined after testing  PLEASE REPORT TO [facility]   Your physician recommends that you continue on your current medications as directed. Please refer to the Current Medication list given to you today.  Your physician recommends that you return for lab work CMP.CBC. MG  Thank you for choosing [facility]!!

## 2015-01-25 NOTE — Progress Notes (Signed)
Patient ID: Erika Ruiz, female   DOB: Dec 10, 1965, 49 y.o.   MRN: [id number]     Clinical Summary Erika Ruiz is a 49 y.o.female seen today as a new patient for the following medical problems.  1. Chest pain - started 3 week ago. Happened while at rest. Sharp pain midchest 10/10. +SOB. No other symptoms. Worst with deep breathing. Pain lasted 10-15 minutes. Ever since then has felt weak, SOB with her previously normal activities. - swelling started shortly prior episode. Started on left leg, later deveopled right sided. Lasix has improved swelling and breathing.  - seen by pcp 2 weeks ago Dr Melina Copa. Started on lasix for swelling. .  - no personal history of blood clots. Father history of DVT. No smoking history. No oral birth control. No long car trips or flights -CAD risk factors: HTN    Past Medical History  Diagnosis Date  . Hyperlipemia   . Vitamin D deficiency disease   . Hypertension   . Osteoarthritis   . Depression   . Uterine cancer      Allergies  Allergen Reactions  . Sulfa Antibiotics Rash     Current Outpatient Prescriptions  Medication Sig Dispense Refill  . albuterol (PROVENTIL HFA;VENTOLIN HFA) 108 (90 BASE) MCG/ACT inhaler Inhale 2 puffs into the lungs every 4 (four) hours as needed for wheezing or shortness of breath. 1 Inhaler 0  . cefdinir (OMNICEF) 300 MG capsule Take 300 mg by mouth 2 (two) times daily. Started 05/09/14    . guaiFENesin-codeine 100-10 MG/5ML syrup Take 5 mLs by mouth every 4 (four) hours as needed for cough. 120 mL 0  . HYDROcodone-acetaminophen (NORCO) 10-325 MG per tablet Take 1 tablet by mouth every 6 (six) hours as needed.    Marland Kitchen ibuprofen (ADVIL,MOTRIN) 800 MG tablet Take 1 tablet (800 mg total) by mouth every 8 (eight) hours as needed for mild pain. 30 tablet 0  . ondansetron (ZOFRAN ODT) 4 MG disintegrating tablet Take 1 tablet (4 mg total) by mouth every 8 (eight) hours as needed for nausea or vomiting. 20 tablet 0  .  oxyCODONE-acetaminophen (PERCOCET/ROXICET) 5-325 MG per tablet Take 1 tablet by mouth every 4 (four) hours as needed for severe pain. (Patient not taking: Reported on 05/11/2014) 20 tablet 0  . telmisartan-hydrochlorothiazide (MICARDIS HCT) 40-12.5 MG per tablet Take 1 tablet by mouth daily.     No current facility-administered medications for this visit.     Past Surgical History  Procedure Laterality Date  . Abdominal hysterectomy      due to CA  . Vaginal delivery      X2  . Left oophorectomy    . Breast lumpectomy Left      Allergies  Allergen Reactions  . Sulfa Antibiotics Rash      Family History  Problem Relation Age of Onset  . Ovarian cancer Paternal Aunt 86  . Colon cancer Sister 86  . Colon polyps Brother   . Diabetes Father   . Heart disease Father   . Kidney disease Father      Social History Erika Ruiz reports that she has never smoked. She has never used smokeless tobacco. Erika Ruiz reports that she does not drink alcohol.   Review of Systems CONSTITUTIONAL: No weight loss, fever, chills, weakness or fatigue.  HEENT: Eyes: No visual loss, blurred vision, double vision or yellow sclerae.No hearing loss, sneezing, congestion, runny nose or sore throat.  SKIN: No rash or itching.  CARDIOVASCULAR: per HPI RESPIRATORY:  No cough or sputum.  GASTROINTESTINAL: No anorexia, nausea, vomiting or diarrhea. No abdominal pain or blood.  GENITOURINARY: No burning on urination, no polyuria NEUROLOGICAL: No headache, dizziness, syncope, paralysis, ataxia, numbness or tingling in the extremities. No change in bowel or bladder control.  MUSCULOSKELETAL: No muscle, back pain, joint pain or stiffness.  LYMPHATICS: No enlarged nodes. No history of splenectomy.  PSYCHIATRIC: No history of depression or anxiety.  ENDOCRINOLOGIC: No reports of sweating, cold or heat intolerance. No polyuria or polydipsia.  Marland Kitchen   Physical Examination Filed Vitals:   01/25/15 1309  BP:  145/87  Pulse: 67   Filed Vitals:   01/25/15 1309  Height: 5\' 6"  (1.676 m)  Weight: 186 lb 6.4 oz (84.55 kg)    Gen: resting comfortably, no acute distress HEENT: no scleral icterus, pupils equal round and reactive, no palptable cervical adenopathy,  CV: RRR, no m/r/g, no jvd Resp: Clear to auscultation bilaterally GI: abdomen is soft, non-tender, non-distended, normal bowel sounds, no hepatosplenomegaly MSK: extremities are warm, no edema.  Skin: warm, no rash Neuro:  no focal deficits Psych: appropriate affect    Assessment and Plan  1. Chest pain - symptoms atypical for cardiac etiology. More concerning for possible PE. Will order CT chest - pending results, may need further cardiac workup for her LE edema and SOB.    F/u pending test results   Arnoldo Lenis, M.D

## 2015-01-26 ENCOUNTER — Telehealth: Payer: Self-pay | Admitting: *Deleted

## 2015-01-26 ENCOUNTER — Ambulatory Visit (HOSPITAL_COMMUNITY)
Admission: RE | Admit: 2015-01-26 | Discharge: 2015-01-26 | Disposition: A | Discharge status: Home / Self Care | Payer: BLUE CROSS/BLUE SHIELD | Source: Ambulatory Visit | Attending: Cardiology | Admitting: Cardiology

## 2015-01-26 DIAGNOSIS — R079 Chest pain, unspecified: Secondary | ICD-10-CM | POA: Diagnosis present

## 2015-01-26 DIAGNOSIS — K449 Diaphragmatic hernia without obstruction or gangrene: Secondary | ICD-10-CM | POA: Insufficient documentation

## 2015-01-26 DIAGNOSIS — R600 Localized edema: Secondary | ICD-10-CM | POA: Diagnosis not present

## 2015-01-26 DIAGNOSIS — R0602 Shortness of breath: Secondary | ICD-10-CM | POA: Insufficient documentation

## 2015-01-26 MED ORDER — IOHEXOL 350 MG/ML SOLN
100.0000 mL | Freq: Once | INTRAVENOUS | Status: AC | PRN
  Administered 2015-01-26: 100 mL via INTRAVENOUS

## 2015-01-26 NOTE — Telephone Encounter (Signed)
-----   Message from Arnoldo Lenis, MD sent at 01/26/2015 11:40 AM EDT ----- Labs look good. Is she scheduled for her CT PE of her chest?  Zandra Abts MD

## 2015-01-26 NOTE — Telephone Encounter (Signed)
Pt aware of lab results, having CT today, routed to pcp

## 2015-01-27 ENCOUNTER — Telehealth: Payer: Self-pay | Admitting: *Deleted

## 2015-01-27 ENCOUNTER — Telehealth: Payer: Self-pay | Admitting: Cardiology

## 2015-01-27 DIAGNOSIS — R079 Chest pain, unspecified: Secondary | ICD-10-CM

## 2015-01-27 NOTE — Telephone Encounter (Signed)
Notes Recorded by Laurine Blazer, LPN on 5/88/5027 at 7:41 PM Patient notified. She agrees to doing Echo. Will put order in & forward to [facility]) for scheduling. Patient prefers to have latest time slot available for this test. Results fwd to pmd.

## 2015-01-27 NOTE — Telephone Encounter (Signed)
Patient returned call. Please call her back on her cell phone and leave detailed message. She stated that she can not answer her phone while she is at work.

## 2015-01-27 NOTE — Telephone Encounter (Signed)
-----   Message from Arnoldo Lenis, MD sent at 01/27/2015 11:20 AM EDT ----- Her chest CT is negative for a clot. We now need to start looking at potential heart causes of her chest pain and swelling. Please order an echo for chest pain.  Zandra Abts MD

## 2015-02-03 ENCOUNTER — Other Ambulatory Visit: Payer: BLUE CROSS/BLUE SHIELD

## 2015-02-10 ENCOUNTER — Other Ambulatory Visit: Payer: BLUE CROSS/BLUE SHIELD

## 2015-02-11 ENCOUNTER — Ambulatory Visit (INDEPENDENT_AMBULATORY_CARE_PROVIDER_SITE_OTHER): Payer: BLUE CROSS/BLUE SHIELD

## 2015-02-11 ENCOUNTER — Other Ambulatory Visit: Payer: Self-pay

## 2015-02-11 DIAGNOSIS — R079 Chest pain, unspecified: Secondary | ICD-10-CM | POA: Diagnosis not present

## 2015-02-17 ENCOUNTER — Telehealth: Payer: Self-pay | Admitting: Cardiology

## 2015-02-17 DIAGNOSIS — R079 Chest pain, unspecified: Secondary | ICD-10-CM

## 2015-02-17 NOTE — Telephone Encounter (Signed)
Pt requesting echo results, echo was done 9/29. Will forward to Dr. Harl Bowie

## 2015-02-17 NOTE — Telephone Encounter (Signed)
TAKES LUNCH BETWEEN 11-12 HOME AFTER 4:30   Would like to know test results

## 2015-02-17 NOTE — Telephone Encounter (Signed)
Pt aware, says 1 other episode of CP 3 days ago didn't last but a few minutes and has not occurred since. Says left leg stays swollen, pt has taken lasix as needed but doesn't take it daily due to occupation. Will forward to Dr. Harl Bowie to see when pt needs f/u

## 2015-02-18 ENCOUNTER — Encounter: Payer: Self-pay | Admitting: *Deleted

## 2015-02-18 NOTE — Telephone Encounter (Signed)
CT scan and echo overall look good. The last test that would help tell us if any potential blockages in the arteries in her heart are causing her continued episodes of chest pain is a stress test. I would order an exercise stress echo, please verify she can run on treadmill.   J Terrick Allred MD

## 2015-02-18 NOTE — Telephone Encounter (Signed)
Pt agreeable to proceed with stress echo. Will forward to schedulers, orders placed

## 2015-02-25 ENCOUNTER — Other Ambulatory Visit (HOSPITAL_COMMUNITY): Payer: BLUE CROSS/BLUE SHIELD

## 2015-03-02 ENCOUNTER — Ambulatory Visit (HOSPITAL_COMMUNITY): Admission: RE | Admit: 2015-03-02 | Payer: BLUE CROSS/BLUE SHIELD | Source: Ambulatory Visit

## 2015-05-16 ENCOUNTER — Encounter (HOSPITAL_COMMUNITY): Payer: Self-pay | Admitting: Emergency Medicine

## 2015-05-16 ENCOUNTER — Emergency Department (HOSPITAL_COMMUNITY)
Admission: EM | Admit: 2015-05-16 | Discharge: 2015-05-16 | Disposition: A | Discharge status: Home / Self Care | Payer: BLUE CROSS/BLUE SHIELD | Attending: Emergency Medicine | Admitting: Emergency Medicine

## 2015-05-16 DIAGNOSIS — Z79899 Other long term (current) drug therapy: Secondary | ICD-10-CM | POA: Diagnosis not present

## 2015-05-16 DIAGNOSIS — Z8541 Personal history of malignant neoplasm of cervix uteri: Secondary | ICD-10-CM | POA: Diagnosis not present

## 2015-05-16 DIAGNOSIS — I10 Essential (primary) hypertension: Secondary | ICD-10-CM | POA: Diagnosis not present

## 2015-05-16 DIAGNOSIS — Z8639 Personal history of other endocrine, nutritional and metabolic disease: Secondary | ICD-10-CM | POA: Diagnosis not present

## 2015-05-16 DIAGNOSIS — J0100 Acute maxillary sinusitis, unspecified: Secondary | ICD-10-CM | POA: Diagnosis not present

## 2015-05-16 DIAGNOSIS — R05 Cough: Secondary | ICD-10-CM | POA: Diagnosis present

## 2015-05-16 DIAGNOSIS — J069 Acute upper respiratory infection, unspecified: Secondary | ICD-10-CM | POA: Diagnosis not present

## 2015-05-16 DIAGNOSIS — M1990 Unspecified osteoarthritis, unspecified site: Secondary | ICD-10-CM | POA: Diagnosis not present

## 2015-05-16 DIAGNOSIS — Z8659 Personal history of other mental and behavioral disorders: Secondary | ICD-10-CM | POA: Insufficient documentation

## 2015-05-16 MED ORDER — AMOXICILLIN-POT CLAVULANATE 875-125 MG PO TABS
1.0000 | ORAL_TABLET | Freq: Once | ORAL | Status: AC
  Administered 2015-05-16: 1 via ORAL
  Filled 2015-05-16: qty 1

## 2015-05-16 MED ORDER — HYDROCOD POLST-CPM POLST ER 10-8 MG/5ML PO SUER
5.0000 mL | Freq: Two times a day (BID) | ORAL | Status: DC | PRN
Start: 2015-05-16 — End: 2017-02-14

## 2015-05-16 MED ORDER — AMOXICILLIN-POT CLAVULANATE 875-125 MG PO TABS: 1.0000 | ORAL_TABLET | Freq: Two times a day (BID) | ORAL | Status: DC

## 2015-05-16 NOTE — Discharge Instructions (Signed)
Prescription for antibiotic and cough syrup.   Increase fluids. Tylenol for fever.

## 2015-05-16 NOTE — ED Notes (Signed)
41 yof PMHx nonsmoker, HTN presents to ED with c/c productive yellow cough, rash with facial swelling to right cheek x 1 week and throat burning. Denies fever, nausea, vomiting, diarrhea.

## 2015-05-16 NOTE — ED Provider Notes (Signed)
CSN: [id number]     Arrival date & time 05/16/15  [id number] History  By signing my name below, I, [facility], attest that this documentation has been prepared under the direction and in the presence of Nat Christen, MD. Electronically Signed: Virgel Bouquet, ED Scribe. 05/16/2015. 8:33 AM.   Chief Complaint  Patient presents with  . Cough   The history is provided by the patient. No language interpreter was used.   HPI Comments: Erika Ruiz is a 50 y.o. female with an hx of HTN who presents to the Emergency Department complaining of intermittent, unchanging, mild cough productive of yellow phlegm onset 5 days ago. Patient endorses associated congestion and facial swelling, worse on the right side of her face near her nose, that presented simultaneously with her chief complaint. She notes similar symptoms in the past excluding the facial swelling for which she was prescribed Tussionex which resolved her symptoms. She denies a hx of smoking. Patient denies wheezing.  Past Medical History  Diagnosis Date  . Hyperlipemia   . Vitamin D deficiency disease   . Hypertension   . Osteoarthritis   . Depression   . Uterine cancer ([place]) 1987   Past Surgical History  Procedure Laterality Date  . Abdominal hysterectomy      due to CA  . Vaginal delivery      X2  . Left oophorectomy    . Breast lumpectomy Left    Family History  Problem Relation Age of Onset  . Ovarian cancer Paternal Aunt 65  . Colon cancer Sister 4  . Colon polyps Brother   . Diabetes Father   . Heart disease Father   . Kidney disease Father    Social History  Substance Use Topics  . Smoking status: Never Smoker   . Smokeless tobacco: Never Used  . Alcohol Use: No   OB History    No data available     Review of Systems A complete 10 system review of systems was obtained and all systems are negative except as noted in the HPI and PMH.    Allergies  Sulfa antibiotics  Home Medications   Prior to  Admission medications   Medication Sig Start Date End Date Taking? Authorizing Provider  furosemide (LASIX) 40 MG tablet Take 40 mg by mouth daily.   Yes Historical Provider, MD  HYDROcodone-acetaminophen (NORCO) 10-325 MG per tablet Take 1 tablet by mouth every 6 (six) hours as needed. 05/03/14  Yes Historical Provider, MD  telmisartan-hydrochlorothiazide (MICARDIS HCT) 40-12.5 MG per tablet Take 1 tablet by mouth daily.   Yes Historical Provider, MD  albuterol (PROVENTIL HFA;VENTOLIN HFA) 108 (90 BASE) MCG/ACT inhaler Inhale 2 puffs into the lungs every 4 (four) hours as needed for wheezing or shortness of breath. 05/11/14   Kristen N Ward, DO  amoxicillin-clavulanate (AUGMENTIN) 875-125 MG tablet Take 1 tablet by mouth 2 (two) times daily. 05/16/15   Nat Christen, MD  chlorpheniramine-HYDROcodone [facility]) 10-8 MG/5ML SUER Take 5 mLs by mouth every 12 (twelve) hours as needed for cough. 05/16/15   Nat Christen, MD  guaiFENesin-codeine 100-10 MG/5ML syrup Take 5 mLs by mouth every 4 (four) hours as needed for cough. 05/11/14   Kristen N Ward, DO  oxyCODONE-acetaminophen (PERCOCET/ROXICET) 5-325 MG per tablet Take 1 tablet by mouth every 4 (four) hours as needed for severe pain. 10/27/13   Evalee Jefferson, PA-C   BP 173/111 mmHg  Pulse 110  Temp(Src) 98.3 F (36.8 C) (Oral)  Resp 18  Ht 5\' 6"  (1.676 m)  Wt 180 lb (81.647 kg)  BMI 29.07 kg/m2  SpO2 97% Physical Exam  Constitutional: She is oriented to person, place, and time. She appears well-developed and well-nourished.  HENT:  Head: Normocephalic and atraumatic.  Puffiness around maxillary sinuses bilaterally.  Eyes: Conjunctivae and EOM are normal. Pupils are equal, round, and reactive to light.  Neck: Normal range of motion. Neck supple.  Cardiovascular: Normal rate and regular rhythm.   Pulmonary/Chest: Effort normal and breath sounds normal.  Abdominal: Soft. Bowel sounds are normal.  Musculoskeletal: Normal range of motion.   Neurological: She is alert and oriented to person, place, and time.  Skin: Skin is warm and dry.  Psychiatric: She has a normal mood and affect. Her behavior is normal.  Nursing note and vitals reviewed.   ED Course  Procedures   DIAGNOSTIC STUDIES: Oxygen Saturation is 97% on RA, normal by my interpretation.    COORDINATION OF CARE: 8:26 AM Will prescribe Augmentin. Discussed treatment plan with pt at bedside and pt agreed to plan.  Labs Review Labs Reviewed - No data to display  Imaging Review No results found. I have personally reviewed and evaluated these images and lab results as part of my medical decision-making.   EKG Interpretation None      MDM   Final diagnoses:  Acute maxillary sinusitis, recurrence not specified  URI (upper respiratory infection)    Patient is nontoxic-appearing. Will prescribe Augmentin for her sinusitis and Tussionex for the cough.  I personally performed the services described in this documentation, which was scribed in my presence. The recorded information has been reviewed and is accurate.    Nat Christen, MD 05/16/15 [phone]

## 2015-08-10 ENCOUNTER — Other Ambulatory Visit: Payer: Self-pay

## 2015-08-10 DIAGNOSIS — Z1231 Encounter for screening mammogram for malignant neoplasm of breast: Secondary | ICD-10-CM

## 2015-08-20 ENCOUNTER — Ambulatory Visit: Payer: BLUE CROSS/BLUE SHIELD

## 2015-10-13 ENCOUNTER — Ambulatory Visit: Payer: BLUE CROSS/BLUE SHIELD

## 2015-10-25 ENCOUNTER — Ambulatory Visit: Payer: BLUE CROSS/BLUE SHIELD

## 2015-11-22 ENCOUNTER — Inpatient Hospital Stay: Admission: RE | Admit: 2015-11-22 | Payer: BLUE CROSS/BLUE SHIELD | Source: Ambulatory Visit

## 2016-02-26 ENCOUNTER — Ambulatory Visit (INDEPENDENT_AMBULATORY_CARE_PROVIDER_SITE_OTHER): Payer: BLUE CROSS/BLUE SHIELD

## 2016-02-26 DIAGNOSIS — Z23 Encounter for immunization: Secondary | ICD-10-CM | POA: Diagnosis not present

## 2016-03-27 ENCOUNTER — Other Ambulatory Visit: Payer: Self-pay | Admitting: *Deleted

## 2016-03-27 ENCOUNTER — Ambulatory Visit
Admission: RE | Admit: 2016-03-27 | Discharge: 2016-03-27 | Disposition: A | Discharge status: Home / Self Care | Payer: BLUE CROSS/BLUE SHIELD | Source: Ambulatory Visit | Attending: *Deleted | Admitting: *Deleted

## 2016-03-27 ENCOUNTER — Ambulatory Visit: Payer: BLUE CROSS/BLUE SHIELD

## 2016-03-27 DIAGNOSIS — R922 Inconclusive mammogram: Secondary | ICD-10-CM

## 2016-07-28 ENCOUNTER — Telehealth: Payer: Self-pay

## 2016-07-28 NOTE — Telephone Encounter (Signed)
Pt called yesterday to say she received triage letter from DS. Please call her at [phone]

## 2016-07-31 ENCOUNTER — Telehealth: Payer: Self-pay

## 2016-07-31 NOTE — Telephone Encounter (Signed)
Pt has appt on 08/30/2016 at 3:00 pm with Roseanne Kaufman, NP for constipation prior to scheduling colonoscopy. She only has 2 or 3 BM's weekly and the stool is hard.  Also, has family hx of colon cancer in her sister at age 51.

## 2016-07-31 NOTE — Telephone Encounter (Signed)
See separate note.

## 2016-07-31 NOTE — Telephone Encounter (Signed)
Pt called today asking for DS. She is trying to figure out how far out the schedule is for her to schedule a colonoscopy. 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 I told her that DS was aware that she had called and would get back with her later this afternoon that she was discharging patients right now. [phone]

## 2016-08-30 ENCOUNTER — Ambulatory Visit: Payer: BLUE CROSS/BLUE SHIELD | Admitting: Gastroenterology

## 2016-09-22 ENCOUNTER — Telehealth: Payer: Self-pay | Admitting: Gastroenterology

## 2016-09-22 ENCOUNTER — Encounter: Payer: Self-pay | Admitting: Gastroenterology

## 2016-09-22 ENCOUNTER — Ambulatory Visit: Payer: BLUE CROSS/BLUE SHIELD | Admitting: Gastroenterology

## 2016-09-22 NOTE — Telephone Encounter (Signed)
Patient was a no show and letter sent  °

## 2017-01-23 ENCOUNTER — Other Ambulatory Visit (HOSPITAL_COMMUNITY): Payer: Self-pay | Admitting: *Deleted

## 2017-01-23 ENCOUNTER — Encounter: Payer: Self-pay | Admitting: Gastroenterology

## 2017-01-23 DIAGNOSIS — K5792 Diverticulitis of intestine, part unspecified, without perforation or abscess without bleeding: Secondary | ICD-10-CM

## 2017-01-30 ENCOUNTER — Ambulatory Visit (HOSPITAL_COMMUNITY)
Admission: RE | Admit: 2017-01-30 | Discharge: 2017-01-30 | Disposition: A | Discharge status: Home / Self Care | Payer: BLUE CROSS/BLUE SHIELD | Source: Ambulatory Visit | Attending: *Deleted | Admitting: *Deleted

## 2017-01-30 DIAGNOSIS — K5792 Diverticulitis of intestine, part unspecified, without perforation or abscess without bleeding: Secondary | ICD-10-CM

## 2017-01-30 DIAGNOSIS — K5730 Diverticulosis of large intestine without perforation or abscess without bleeding: Secondary | ICD-10-CM | POA: Diagnosis not present

## 2017-02-14 ENCOUNTER — Other Ambulatory Visit: Payer: Self-pay

## 2017-02-14 ENCOUNTER — Ambulatory Visit (INDEPENDENT_AMBULATORY_CARE_PROVIDER_SITE_OTHER): Payer: BLUE CROSS/BLUE SHIELD | Admitting: Gastroenterology

## 2017-02-14 ENCOUNTER — Encounter: Payer: Self-pay | Admitting: Gastroenterology

## 2017-02-14 VITALS — BP 167/102 | HR 85 | Temp 98.1°F | Ht 66.0 in | Wt 191.6 lb

## 2017-02-14 DIAGNOSIS — R1013 Epigastric pain: Secondary | ICD-10-CM

## 2017-02-14 DIAGNOSIS — Z800 Family history of malignant neoplasm of digestive organs: Secondary | ICD-10-CM | POA: Diagnosis not present

## 2017-02-14 DIAGNOSIS — R109 Unspecified abdominal pain: Secondary | ICD-10-CM

## 2017-02-14 MED ORDER — PEG 3350-KCL-NA BICARB-NACL 420 G PO SOLR: 4000.0000 mL | ORAL | 0 refills | Status: DC

## 2017-02-14 MED ORDER — PANTOPRAZOLE SODIUM 40 MG PO TBEC: 40.0000 mg | DELAYED_RELEASE_TABLET | Freq: Every day | ORAL | 3 refills | Status: DC

## 2017-02-14 NOTE — Progress Notes (Addendum)
REVIEWED-NO ADDITIONAL RECOMMENDATIONS.  Primary Care Physician:  Octavio Graves, DO Primary Gastroenterologist:  Dr. Oneida Alar   Chief Complaint  Patient presents with  . Abdominal Pain    ?diverticulits; sister had colon cancer  . Constipation  . Rectal Bleeding    HPI:   Erika Ruiz is a 51 y.o. female presenting today at the request of Octavio Graves, DO secondary to abdominal pain. She was treated empirically for diverticulitis at [facility] urgent care, with follow-up CT here without evidence of diverticulitis. Sister with history of colon cancer age 63. Colonoscopy around age 29 but none since that time.    Had LLQ and upper abdominal pain that was severe, so she presented to the Urgent Care at [facility]. Small amount of blood was passed after an enema. Stays nauseated for the past few months. Taking Zofran. No vomiting. Sometimes abdominal pain epigastric region after eating. No dysphagia. No unexplained weight loss or lack of appetite. Ibuprofen 2 pills a day. Wants to eat but has early satiety. Had been recommended Linzess, which was expensive with insurance.    Past Medical History:  Diagnosis Date  . Depression   . Hyperlipemia   . Hypertension   . Osteoarthritis   . Uterine cancer ([place]) 1987  . Vitamin D deficiency disease     Past Surgical History:  Procedure Laterality Date  . ABDOMINAL HYSTERECTOMY     due to CA  . BREAST LUMPECTOMY Left   . LEFT OOPHORECTOMY    . VAGINAL DELIVERY     X2    Current Outpatient Prescriptions  Medication Sig Dispense Refill  . albuterol (PROVENTIL HFA;VENTOLIN HFA) 108 (90 BASE) MCG/ACT inhaler Inhale 2 puffs into the lungs every 4 (four) hours as needed for wheezing or shortness of breath. 1 Inhaler 0  . furosemide (LASIX) 40 MG tablet Take 40 mg by mouth daily.    . Oxycodone HCl 20 MG TABS Take 20 mg by mouth as needed.  0  . telmisartan-hydrochlorothiazide (MICARDIS HCT) 40-12.5 MG per tablet Take 1 tablet by  mouth daily.    . pantoprazole (PROTONIX) 40 MG tablet Take 1 tablet (40 mg total) by mouth daily. 30 minutes prior to breakfast 90 tablet 3  . polyethylene glycol-electrolytes (TRILYTE) 420 g solution Take 4,000 mLs by mouth as directed. 4000 mL 0   No current facility-administered medications for this visit.     Allergies as of 02/14/2017 - Review Complete 02/14/2017  Allergen Reaction Noted  . Sulfa antibiotics Rash 07/10/2012    Family History  Problem Relation Age of Onset  . Ovarian cancer Paternal Aunt 17  . Colon cancer Sister 15  . Colon polyps Brother   . Diabetes Father   . Heart disease Father   . Kidney disease Father   . Breast cancer Mother     Social History   Social History  . Marital status: Legally Separated    Spouse name: N/A  . Number of children: 2  . Years of education: N/A   Occupational History  . training instructor [employer]   Social History Main Topics  . Smoking status: Never Smoker  . Smokeless tobacco: Never Used  . Alcohol use No  . Drug use: No  . Sexual activity: Yes   Other Topics Concern  . Not on file   Social History Narrative  . No narrative on file    Review of Systems: Gen: see HPI  CV: Denies chest pain, heart palpitations, peripheral edema,  syncope.  Resp: Denies shortness of breath at rest or with exertion. Denies wheezing or cough.  GI: see HPI  GU : Denies urinary burning, urinary frequency, urinary hesitancy MS: Denies joint pain, muscle weakness, cramps, or limitation of movement.  Derm: Denies rash, itching, dry skin Psych: Denies depression, anxiety, memory loss, and confusion Heme: see HPI   Physical Exam: BP (!) 167/102   Pulse 85   Temp 98.1 F (36.7 C) (Oral)   Ht 5\' 6"  (1.676 m)   Wt 191 lb 9.6 oz (86.9 kg)   BMI 30.93 kg/m  General:   Alert and oriented. Pleasant and cooperative. Well-nourished and well-developed.  Head:  Normocephalic and atraumatic. Eyes:  Without icterus, sclera clear  and conjunctiva pink.  Ears:  Normal auditory acuity. Nose:  No deformity, discharge,  or lesions. Mouth:  No deformity or lesions, oral mucosa pink.  Lungs:  Clear to auscultation bilaterally. No wheezes, rales, or rhonchi. No distress.  Heart:  S1, S2 present without murmurs appreciated.  Abdomen:  +BS, soft, mild LLQ TTP and non-distended. No HSM noted. No guarding or rebound. No masses appreciated.  Rectal:  Deferred  Msk:  Symmetrical without gross deformities. Normal posture. Extremities:  Without edema. Neurologic:  Alert and  oriented x4 Skin:  Intact without significant lesions or rashes. Psych:  Alert and cooperative. Normal mood and affect.

## 2017-02-14 NOTE — Patient Instructions (Signed)
Please have blood work done today.   I have sent in a prescription to take every day called Protonix. This is to help with upper abdominal pain, nausea.  We are completely out of Linzess samples. Since you have tried this before, I'm giving you a different medication called Amitiza. Take this WITH FOOD TWICE A DAY to avoid nausea. (a side effect is nausea). Let me know how you like this. We can always try the Linzess once we get samples.  We have scheduled you for a colonoscopy and upper endoscopy with Dr. Oneida Alar in the near future.  We will see you in 2 months.

## 2017-02-15 ENCOUNTER — Telehealth: Payer: Self-pay

## 2017-02-15 LAB — CBC WITH DIFFERENTIAL/PLATELET
Basophils Absolute: 28 cells/uL (ref 0–200)
Basophils Relative: 0.4 %
Eosinophils Absolute: 90 cells/uL (ref 15–500)
Eosinophils Relative: 1.3 %
HCT: 40.1 % (ref 35.0–45.0)
Hemoglobin: 14.2 g/dL (ref 11.7–15.5)
Lymphs Abs: 1980 cells/uL (ref 850–3900)
MCH: 33.4 pg — ABNORMAL HIGH (ref 27.0–33.0)
MCHC: 35.4 g/dL (ref 32.0–36.0)
MCV: 94.4 fL (ref 80.0–100.0)
MONOS PCT: 8 %
MPV: 10 fL (ref 7.5–12.5)
Neutro Abs: 4250 cells/uL (ref 1500–7800)
Neutrophils Relative %: 61.6 %
PLATELETS: 285 10*3/uL (ref 140–400)
RBC: 4.25 10*6/uL (ref 3.80–5.10)
RDW: 13.7 % (ref 11.0–15.0)
TOTAL LYMPHOCYTE: 28.7 %
WBC: 6.9 10*3/uL (ref 3.8–10.8)
WBCMIX: 552 {cells}/uL (ref 200–950)

## 2017-02-15 LAB — COMPLETE METABOLIC PANEL WITH GFR
AG RATIO: 1.5 (calc) (ref 1.0–2.5)
ALT: 28 U/L (ref 6–29)
AST: 23 U/L (ref 10–35)
Albumin: 4.4 g/dL (ref 3.6–5.1)
Alkaline phosphatase (APISO): 92 U/L (ref 33–130)
BUN: 12 mg/dL (ref 7–25)
CALCIUM: 9.5 mg/dL (ref 8.6–10.4)
CO2: 30 mmol/L (ref 20–32)
Chloride: 100 mmol/L (ref 98–110)
Creat: 0.87 mg/dL (ref 0.50–1.05)
GFR, EST NON AFRICAN AMERICAN: 78 mL/min/{1.73_m2} (ref >=60)
GFR, Est African American: 90 mL/min/{1.73_m2} (ref >=60)
Globulin: 2.9 g/dL (calc) (ref 1.9–3.7)
Glucose, Bld: 102 mg/dL (ref 65–139)
POTASSIUM: 3.8 mmol/L (ref 3.5–5.3)
Sodium: 138 mmol/L (ref 135–146)
Total Bilirubin: 0.5 mg/dL (ref 0.2–1.2)
Total Protein: 7.3 g/dL (ref 6.1–8.1)

## 2017-02-15 NOTE — Assessment & Plan Note (Addendum)
51 year old presenting today in consultation by PCP secondary to abdominal pain, treated empirically for diverticulitis but without evidence of diverticulitis on CT. Notes upper abdominal pain after eating at times without dysphagia or lack of appetite. Endorses Ibuprofen daily. Constipation also an issue, which is likely playing a role. Unable to afford Linzess as expensive even with insurance. Will trial  Amitiza 8 mcg samples. Obtain CBC, CMP, and add Protonix once daily.   Needs colonoscopy due to [place] of colon cancer (sister age 59). Low-volume hematochezia as isolated incidence and likely due to rectal trauma after enema.  Proceed with colonoscopy with Dr. Oneida Alar in the near future. The risks, benefits, and alternatives have been discussed in detail with the patient. They state understanding and desire to proceed.  Will also pursue EGD at time of TCS due to new-onset dyspepsia (abdominal pain, early satiety, nausea) in setting of NSAIDs.  PROPOFOL due to polypharmacy  Return in 2 months.

## 2017-02-15 NOTE — Assessment & Plan Note (Addendum)
Colonoscopy as planned.

## 2017-02-15 NOTE — Telephone Encounter (Signed)
Called and informed pt of pre-op appt 03/08/17 at 1:45pm. Letter mailed.

## 2017-02-19 ENCOUNTER — Telehealth: Payer: Self-pay | Admitting: Gastroenterology

## 2017-02-19 NOTE — Telephone Encounter (Signed)
See result note. I also sent a message in mychart.

## 2017-02-19 NOTE — Progress Notes (Signed)
cc'ed to pcp °

## 2017-02-19 NOTE — Telephone Encounter (Signed)
PT is aware that we have 7-10 business days to call with results, but I told her that I would let Roseanne Kaufman, NP know that she has called.

## 2017-02-19 NOTE — Progress Notes (Signed)
Left the full message on VM.  

## 2017-02-19 NOTE — Telephone Encounter (Signed)
Noted  

## 2017-02-19 NOTE — Progress Notes (Signed)
No anemia noted. LFTs normal. Sent message in [place]

## 2017-02-19 NOTE — Telephone Encounter (Signed)
Pt was calling to follow up on her lab results. Please call [phone]

## 2017-02-28 ENCOUNTER — Other Ambulatory Visit: Payer: Self-pay | Admitting: *Deleted

## 2017-02-28 DIAGNOSIS — Z1231 Encounter for screening mammogram for malignant neoplasm of breast: Secondary | ICD-10-CM

## 2017-03-06 NOTE — Patient Instructions (Signed)
Erika Ruiz  03/06/2017     @PREFPERIOPPHARMACY @   Your procedure is scheduled on  03/13/2017   Report to [facility] at  64  A.M.  Call this number if you have problems the morning of surgery:  [phone]   Remember:  Do not eat food or drink liquids after midnight.  Take these medicines the morning of surgery with A SIP OF WATER  Oxycodone, protonix, micardis.   Do not wear jewelry, make-up or nail polish.  Do not wear lotions, powders, or perfumes, or deoderant.  Do not shave 48 hours prior to surgery.  Men may shave face and neck.  Do not bring valuables to the hospital.  [facility] is not responsible for any belongings or valuables.  Contacts, dentures or bridgework may not be worn into surgery.  Leave your suitcase in the car.  After surgery it may be brought to your room.  For patients admitted to the hospital, discharge time will be determined by your treatment team.  Patients discharged the day of surgery will not be allowed to drive home.   Name and phone number of your driver:   family Special instructions:  Follow the diet and prep instructions given to you by Dr Nona Dell office.  Please read over the following fact sheets that you were given. Anesthesia Post-op Instructions and Care and Recovery After Surgery       Esophagogastroduodenoscopy Esophagogastroduodenoscopy (EGD) is a procedure to examine the lining of the esophagus, stomach, and first part of the small intestine (duodenum). This procedure is done to check for problems such as inflammation, bleeding, ulcers, or growths. During this procedure, a long, flexible, lighted tube with a camera attached (endoscope) is inserted down the throat. Tell a health care provider about:  Any allergies you have.  All medicines you are taking, including vitamins, herbs, eye drops, creams, and over-the-counter medicines.  Any problems you or family members have had with anesthetic  medicines.  Any blood disorders you have.  Any surgeries you have had.  Any medical conditions you have.  Whether you are pregnant or may be pregnant. What are the risks? Generally, this is a safe procedure. However, problems may occur, including:  Infection.  Bleeding.  A tear (perforation) in the esophagus, stomach, or duodenum.  Trouble breathing.  Excessive sweating.  Spasms of the larynx.  A slowed heartbeat.  Low blood pressure.  What happens before the procedure?  Follow instructions from your health care provider about eating or drinking restrictions.  Ask your health care provider about: ? Changing or stopping your regular medicines. This is especially important if you are taking diabetes medicines or blood thinners. ? Taking medicines such as aspirin and ibuprofen. These medicines can thin your blood. Do not take these medicines before your procedure if your health care provider instructs you not to.  Plan to have someone take you home after the procedure.  If you wear dentures, be ready to remove them before the procedure. What happens during the procedure?  To reduce your risk of infection, your health care team will wash or sanitize their hands.  An IV tube will be put in a vein in your hand or arm. You will get medicines and fluids through this tube.  You will be given one or more of the following: ? A medicine to help you relax (sedative). ? A medicine to numb the area (local anesthetic).  This medicine may be sprayed into your throat. It will make you feel more comfortable and keep you from gagging or coughing during the procedure. ? A medicine for pain.  A mouth guard may be placed in your mouth to protect your teeth and to keep you from biting on the endoscope.  You will be asked to lie on your left side.  The endoscope will be lowered down your throat into your esophagus, stomach, and duodenum.  Air will be put into the endoscope. This will  help your health care provider see better.  The lining of your esophagus, stomach, and duodenum will be examined.  Your health care provider may: ? Take a tissue sample so it can be looked at in a lab (biopsy). ? Remove growths. ? Remove objects (foreign bodies) that are stuck. ? Treat any bleeding with medicines or other devices that stop tissue from bleeding. ? Widen (dilate) or stretch narrowed areas of your esophagus and stomach.  The endoscope will be taken out. The procedure may vary among health care providers and hospitals. What happens after the procedure?  Your blood pressure, heart rate, breathing rate, and blood oxygen level will be monitored often until the medicines you were given have worn off.  Do not eat or drink anything until the numbing medicine has worn off and your gag reflex has returned. This information is not intended to replace advice given to you by your health care provider. Make sure you discuss any questions you have with your health care provider. Document Released: 09/01/2004 Document Revised: 10/07/2015 Document Reviewed: 03/25/2015 Elsevier Interactive Patient Education  2018 [employer]. Esophagogastroduodenoscopy, Care After Refer to this sheet in the next few weeks. These instructions provide you with information about caring for yourself after your procedure. Your health care provider may also give you more specific instructions. Your treatment has been planned according to current medical practices, but problems sometimes occur. Call your health care provider if you have any problems or questions after your procedure. What can I expect after the procedure? After the procedure, it is common to have:  A sore throat.  Nausea.  Bloating.  Dizziness.  Fatigue.  Follow these instructions at home:  Do not eat or drink anything until the numbing medicine (local anesthetic) has worn off and your gag reflex has returned. You will know that the  local anesthetic has worn off when you can swallow comfortably.  Do not drive for 24 hours if you received a medicine to help you relax (sedative).  If your health care provider took a tissue sample for testing during the procedure, make sure to get your test results. This is your responsibility. Ask your health care provider or the department performing the test when your results will be ready.  Keep all follow-up visits as told by your health care provider. This is important. Contact a health care provider if:  You cannot stop coughing.  You are not urinating.  You are urinating less than usual. Get help right away if:  You have trouble swallowing.  You cannot eat or drink.  You have throat or chest pain that gets worse.  You are dizzy or light-headed.  You faint.  You have nausea or vomiting.  You have chills.  You have a fever.  You have severe abdominal pain.  You have black, tarry, or bloody stools. This information is not intended to replace advice given to you by your health care provider. Make sure you discuss any questions  you have with your health care provider. Document Released: 04/17/2012 Document Revised: 10/07/2015 Document Reviewed: 03/25/2015 Elsevier Interactive Patient Education  2018 [employer].  Colonoscopy, Adult A colonoscopy is an exam to look at the entire large intestine. During the exam, a lubricated, bendable tube is inserted into the anus and then passed into the rectum, colon, and other parts of the large intestine. A colonoscopy is often done as a part of normal colorectal screening or in response to certain symptoms, such as anemia, persistent diarrhea, abdominal pain, and blood in the stool. The exam can help screen for and diagnose medical problems, including:  Tumors.  Polyps.  Inflammation.  Areas of bleeding.  Tell a health care provider about:  Any allergies you have.  All medicines you are taking, including vitamins,  herbs, eye drops, creams, and over-the-counter medicines.  Any problems you or family members have had with anesthetic medicines.  Any blood disorders you have.  Any surgeries you have had.  Any medical conditions you have.  Any problems you have had passing stool. What are the risks? Generally, this is a safe procedure. However, problems may occur, including:  Bleeding.  A tear in the intestine.  A reaction to medicines given during the exam.  Infection (rare).  What happens before the procedure? Eating and drinking restrictions Follow instructions from your health care provider about eating and drinking, which may include:  A few days before the procedure - follow a low-fiber diet. Avoid nuts, seeds, dried fruit, raw fruits, and vegetables.  1-3 days before the procedure - follow a clear liquid diet. Drink only clear liquids, such as clear broth or bouillon, black coffee or tea, clear juice, clear soft drinks or sports drinks, gelatin dessert, and popsicles. Avoid any liquids that contain red or purple dye.  On the day of the procedure - do not eat or drink anything during the 2 hours before the procedure, or within the time period that your health care provider recommends.  Bowel prep If you were prescribed an oral bowel prep to clean out your colon:  Take it as told by your health care provider. Starting the day before your procedure, you will need to drink a large amount of medicated liquid. The liquid will cause you to have multiple loose stools until your stool is almost clear or light green.  If your skin or anus gets irritated from diarrhea, you may use these to relieve the irritation: ? Medicated wipes, such as adult wet wipes with aloe and vitamin E. ? A skin soothing-product like petroleum jelly.  If you vomit while drinking the bowel prep, take a break for up to 60 minutes and then begin the bowel prep again. If vomiting continues and you cannot take the bowel  prep without vomiting, call your health care provider.  General instructions  Ask your health care provider about changing or stopping your regular medicines. This is especially important if you are taking diabetes medicines or blood thinners.  Plan to have someone take you home from the hospital or clinic. What happens during the procedure?  An IV tube may be inserted into one of your veins.  You will be given medicine to help you relax (sedative).  To reduce your risk of infection: ? Your health care team will wash or sanitize their hands. ? Your anal area will be washed with soap.  You will be asked to lie on your side with your knees bent.  Your health care provider will  lubricate a long, thin, flexible tube. The tube will have a camera and a light on the end.  The tube will be inserted into your anus.  The tube will be gently eased through your rectum and colon.  Air will be delivered into your colon to keep it open. You may feel some pressure or cramping.  The camera will be used to take images during the procedure.  A small tissue sample may be removed from your body to be examined under a microscope (biopsy). If any potential problems are found, the tissue will be sent to a lab for testing.  If small polyps are found, your health care provider may remove them and have them checked for cancer cells.  The tube that was inserted into your anus will be slowly removed. The procedure may vary among health care providers and hospitals. What happens after the procedure?  Your blood pressure, heart rate, breathing rate, and blood oxygen level will be monitored until the medicines you were given have worn off.  Do not drive for 24 hours after the exam.  You may have a small amount of blood in your stool.  You may pass gas and have mild abdominal cramping or bloating due to the air that was used to inflate your colon during the exam.  It is up to you to get the results of  your procedure. Ask your health care provider, or the department performing the procedure, when your results will be ready. This information is not intended to replace advice given to you by your health care provider. Make sure you discuss any questions you have with your health care provider. Document Released: 04/28/2000 Document Revised: 03/01/2016 Document Reviewed: 07/13/2015 Elsevier Interactive Patient Education  2018 [employer].  Colonoscopy, Adult, Care After This sheet gives you information about how to care for yourself after your procedure. Your health care provider may also give you more specific instructions. If you have problems or questions, contact your health care provider. What can I expect after the procedure? After the procedure, it is common to have:  A small amount of blood in your stool for 24 hours after the procedure.  Some gas.  Mild abdominal cramping or bloating.  Follow these instructions at home: General instructions   For the first 24 hours after the procedure: ? Do not drive or use machinery. ? Do not sign important documents. ? Do not drink alcohol. ? Do your regular daily activities at a slower pace than normal. ? Eat soft, easy-to-digest foods. ? Rest often.  Take over-the-counter or prescription medicines only as told by your health care provider.  It is up to you to get the results of your procedure. Ask your health care provider, or the department performing the procedure, when your results will be ready. Relieving cramping and bloating  Try walking around when you have cramps or feel bloated.  Apply heat to your abdomen as told by your health care provider. Use a heat source that your health care provider recommends, such as a moist heat pack or a heating pad. ? Place a towel between your skin and the heat source. ? Leave the heat on for 20-30 minutes. ? Remove the heat if your skin turns bright red. This is especially important if you  are unable to feel pain, heat, or cold. You may have a greater risk of getting burned. Eating and drinking  Drink enough fluid to keep your urine clear or pale yellow.  Resume your  normal diet as instructed by your health care provider. Avoid heavy or fried foods that are hard to digest.  Avoid drinking alcohol for as long as instructed by your health care provider. Contact a health care provider if:  You have blood in your stool 2-3 days after the procedure. Get help right away if:  You have more than a small spotting of blood in your stool.  You pass large blood clots in your stool.  Your abdomen is swollen.  You have nausea or vomiting.  You have a fever.  You have increasing abdominal pain that is not relieved with medicine. This information is not intended to replace advice given to you by your health care provider. Make sure you discuss any questions you have with your health care provider. Document Released: 12/14/2003 Document Revised: 01/24/2016 Document Reviewed: 07/13/2015 Elsevier Interactive Patient Education  2018 [employer] Anesthesia is a term that refers to techniques, procedures, and medicines that help a person stay safe and comfortable during a medical procedure. Monitored anesthesia care, or sedation, is one type of anesthesia. Your anesthesia specialist may recommend sedation if you will be having a procedure that does not require you to be unconscious, such as:  Cataract surgery.  A dental procedure.  A biopsy.  A colonoscopy.  During the procedure, you may receive a medicine to help you relax (sedative). There are three levels of sedation:  Mild sedation. At this level, you may feel awake and relaxed. You will be able to follow directions.  Moderate sedation. At this level, you will be sleepy. You may not remember the procedure.  Deep sedation. At this level, you will be asleep. You will not remember the  procedure.  The more medicine you are given, the deeper your level of sedation will be. Depending on how you respond to the procedure, the anesthesia specialist may change your level of sedation or the type of anesthesia to fit your needs. An anesthesia specialist will monitor you closely during the procedure. Let your health care provider know about:  Any allergies you have.  All medicines you are taking, including vitamins, herbs, eye drops, creams, and over-the-counter medicines.  Any use of steroids (by mouth or as a cream).  Any problems you or family members have had with sedatives and anesthetic medicines.  Any blood disorders you have.  Any surgeries you have had.  Any medical conditions you have, such as sleep apnea.  Whether you are pregnant or may be pregnant.  Any use of cigarettes, alcohol, or street drugs. What are the risks? Generally, this is a safe procedure. However, problems may occur, including:  Getting too much medicine (oversedation).  Nausea.  Allergic reaction to medicines.  Trouble breathing. If this happens, a breathing tube may be used to help with breathing. It will be removed when you are awake and breathing on your own.  Heart trouble.  Lung trouble.  Before the procedure Staying hydrated Follow instructions from your health care provider about hydration, which may include:  Up to 2 hours before the procedure - you may continue to drink clear liquids, such as water, clear fruit juice, black coffee, and plain tea.  Eating and drinking restrictions Follow instructions from your health care provider about eating and drinking, which may include:  8 hours before the procedure - stop eating heavy meals or foods such as meat, fried foods, or fatty foods.  6 hours before the procedure - stop eating light meals or  foods, such as toast or cereal.  6 hours before the procedure - stop drinking milk or drinks that contain milk.  2 hours before the  procedure - stop drinking clear liquids.  Medicines Ask your health care provider about:  Changing or stopping your regular medicines. This is especially important if you are taking diabetes medicines or blood thinners.  Taking medicines such as aspirin and ibuprofen. These medicines can thin your blood. Do not take these medicines before your procedure if your health care provider instructs you not to.  Tests and exams  You will have a physical exam.  You may have blood tests done to show: ? How well your kidneys and liver are working. ? How well your blood can clot.  General instructions  Plan to have someone take you home from the hospital or clinic.  If you will be going home right after the procedure, plan to have someone with you for 24 hours.  What happens during the procedure?  Your blood pressure, heart rate, breathing, level of pain and overall condition will be monitored.  An IV tube will be inserted into one of your veins.  Your anesthesia specialist will give you medicines as needed to keep you comfortable during the procedure. This may mean changing the level of sedation.  The procedure will be performed. After the procedure  Your blood pressure, heart rate, breathing rate, and blood oxygen level will be monitored until the medicines you were given have worn off.  Do not drive for 24 hours if you received a sedative.  You may: ? Feel sleepy, clumsy, or nauseous. ? Feel forgetful about what happened after the procedure. ? Have a sore throat if you had a breathing tube during the procedure. ? Vomit. This information is not intended to replace advice given to you by your health care provider. Make sure you discuss any questions you have with your health care provider. Document Released: 01/25/2005 Document Revised: 10/08/2015 Document Reviewed: 08/22/2015 Elsevier Interactive Patient Education  2018 [employer], Care After These  instructions provide you with information about caring for yourself after your procedure. Your health care provider may also give you more specific instructions. Your treatment has been planned according to current medical practices, but problems sometimes occur. Call your health care provider if you have any problems or questions after your procedure. What can I expect after the procedure? After your procedure, it is common to:  Feel sleepy for several hours.  Feel clumsy and have poor balance for several hours.  Feel forgetful about what happened after the procedure.  Have poor judgment for several hours.  Feel nauseous or vomit.  Have a sore throat if you had a breathing tube during the procedure.  Follow these instructions at home: For at least 24 hours after the procedure:   Do not: ? Participate in activities in which you could fall or become injured. ? Drive. ? Use heavy machinery. ? Drink alcohol. ? Take sleeping pills or medicines that cause drowsiness. ? Make important decisions or sign legal documents. ? Take care of children on your own.  Rest. Eating and drinking  Follow the diet that is recommended by your health care provider.  If you vomit, drink water, juice, or soup when you can drink without vomiting.  Make sure you have little or no nausea before eating solid foods. General instructions  Have a responsible adult stay with you until you are awake and alert.  Take over-the-counter  and prescription medicines only as told by your health care provider.  If you smoke, do not smoke without supervision.  Keep all follow-up visits as told by your health care provider. This is important. Contact a health care provider if:  You keep feeling nauseous or you keep vomiting.  You feel light-headed.  You develop a rash.  You have a fever. Get help right away if:  You have trouble breathing. This information is not intended to replace advice given to you  by your health care provider. Make sure you discuss any questions you have with your health care provider. Document Released: 08/22/2015 Document Revised: 12/22/2015 Document Reviewed: 08/22/2015 Elsevier Interactive Patient Education  [employer].

## 2017-03-08 ENCOUNTER — Encounter (HOSPITAL_COMMUNITY)
Admission: RE | Admit: 2017-03-08 | Discharge: 2017-03-08 | Disposition: A | Discharge status: Home / Self Care | Payer: BLUE CROSS/BLUE SHIELD | Source: Ambulatory Visit | Attending: Gastroenterology | Admitting: Gastroenterology

## 2017-03-08 ENCOUNTER — Encounter (HOSPITAL_COMMUNITY): Payer: Self-pay

## 2017-03-08 DIAGNOSIS — Z01810 Encounter for preprocedural cardiovascular examination: Secondary | ICD-10-CM | POA: Diagnosis not present

## 2017-03-09 ENCOUNTER — Ambulatory Visit: Payer: BLUE CROSS/BLUE SHIELD | Admitting: Gastroenterology

## 2017-03-12 ENCOUNTER — Telehealth: Payer: Self-pay | Admitting: Gastroenterology

## 2017-03-12 NOTE — Telephone Encounter (Signed)
Pt asked if the nurse would call her back. She has questions about her procedures with [facility] tomorrow. Please call [phone]

## 2017-03-12 NOTE — Telephone Encounter (Signed)
Called and spoke to pt. She wanted to make sure it was ok to take her bp med since it had a fluid pill in it prior to procedure. She also wanted to make sure she would be "out" for the procedure.

## 2017-03-13 ENCOUNTER — Ambulatory Visit (HOSPITAL_COMMUNITY): Payer: BLUE CROSS/BLUE SHIELD | Admitting: Certified Registered Nurse Anesthetist

## 2017-03-13 ENCOUNTER — Encounter (HOSPITAL_COMMUNITY)
Admission: RE | Disposition: A | Discharge status: Home / Self Care | Payer: Self-pay | Source: Ambulatory Visit | Attending: Gastroenterology

## 2017-03-13 ENCOUNTER — Encounter (HOSPITAL_COMMUNITY): Payer: Self-pay | Admitting: Gastroenterology

## 2017-03-13 ENCOUNTER — Ambulatory Visit (HOSPITAL_COMMUNITY)
Admission: RE | Admit: 2017-03-13 | Discharge: 2017-03-13 | Disposition: A | Discharge status: Home / Self Care | Payer: BLUE CROSS/BLUE SHIELD | Source: Ambulatory Visit | Attending: Gastroenterology | Admitting: Gastroenterology

## 2017-03-13 DIAGNOSIS — Q438 Other specified congenital malformations of intestine: Secondary | ICD-10-CM | POA: Diagnosis not present

## 2017-03-13 DIAGNOSIS — K449 Diaphragmatic hernia without obstruction or gangrene: Secondary | ICD-10-CM | POA: Diagnosis not present

## 2017-03-13 DIAGNOSIS — K644 Residual hemorrhoidal skin tags: Secondary | ICD-10-CM | POA: Insufficient documentation

## 2017-03-13 DIAGNOSIS — R1013 Epigastric pain: Secondary | ICD-10-CM | POA: Insufficient documentation

## 2017-03-13 DIAGNOSIS — Z791 Long term (current) use of non-steroidal anti-inflammatories (NSAID): Secondary | ICD-10-CM | POA: Insufficient documentation

## 2017-03-13 DIAGNOSIS — Z8542 Personal history of malignant neoplasm of other parts of uterus: Secondary | ICD-10-CM | POA: Diagnosis not present

## 2017-03-13 DIAGNOSIS — K2970 Gastritis, unspecified, without bleeding: Secondary | ICD-10-CM | POA: Diagnosis not present

## 2017-03-13 DIAGNOSIS — Z800 Family history of malignant neoplasm of digestive organs: Secondary | ICD-10-CM | POA: Diagnosis not present

## 2017-03-13 DIAGNOSIS — Z1211 Encounter for screening for malignant neoplasm of colon: Secondary | ICD-10-CM | POA: Insufficient documentation

## 2017-03-13 DIAGNOSIS — K2950 Unspecified chronic gastritis without bleeding: Secondary | ICD-10-CM | POA: Insufficient documentation

## 2017-03-13 DIAGNOSIS — Z8371 Family history of colonic polyps: Secondary | ICD-10-CM | POA: Insufficient documentation

## 2017-03-13 DIAGNOSIS — K621 Rectal polyp: Secondary | ICD-10-CM | POA: Diagnosis not present

## 2017-03-13 DIAGNOSIS — D128 Benign neoplasm of rectum: Secondary | ICD-10-CM

## 2017-03-13 DIAGNOSIS — K648 Other hemorrhoids: Secondary | ICD-10-CM | POA: Insufficient documentation

## 2017-03-13 DIAGNOSIS — K317 Polyp of stomach and duodenum: Secondary | ICD-10-CM | POA: Diagnosis not present

## 2017-03-13 DIAGNOSIS — I10 Essential (primary) hypertension: Secondary | ICD-10-CM | POA: Insufficient documentation

## 2017-03-13 HISTORY — PX: POLYPECTOMY: SHX5525

## 2017-03-13 HISTORY — PX: ESOPHAGOGASTRODUODENOSCOPY (EGD) WITH PROPOFOL: SHX5813

## 2017-03-13 HISTORY — PX: BIOPSY: SHX5522

## 2017-03-13 HISTORY — PX: COLONOSCOPY WITH PROPOFOL: SHX5780

## 2017-03-13 SURGERY — COLONOSCOPY WITH PROPOFOL
Anesthesia: Monitor Anesthesia Care

## 2017-03-13 MED ORDER — LIDOCAINE VISCOUS 2 % MT SOLN
OROMUCOSAL | Status: AC
  Filled 2017-03-13: qty 15

## 2017-03-13 MED ORDER — LIDOCAINE VISCOUS 2 % MT SOLN
15.0000 mL | Freq: Once | OROMUCOSAL | Status: AC
  Administered 2017-03-13: 3 mL via OROMUCOSAL

## 2017-03-13 MED ORDER — MIDAZOLAM HCL 2 MG/2ML IJ SOLN
INTRAMUSCULAR | Status: AC
  Filled 2017-03-13: qty 2

## 2017-03-13 MED ORDER — CHLORHEXIDINE GLUCONATE CLOTH 2 % EX PADS: 6.0000 | MEDICATED_PAD | Freq: Once | CUTANEOUS | Status: DC

## 2017-03-13 MED ORDER — LIDOCAINE HCL (CARDIAC) 20 MG/ML IV SOLN
INTRAVENOUS | Status: DC | PRN
  Administered 2017-03-13: 50 mg via INTRAVENOUS

## 2017-03-13 MED ORDER — PROPOFOL 10 MG/ML IV BOLUS
INTRAVENOUS | Status: AC
  Filled 2017-03-13: qty 20

## 2017-03-13 MED ORDER — PROPOFOL 500 MG/50ML IV EMUL
INTRAVENOUS | Status: DC | PRN
  Administered 2017-03-13: 100 ug/kg/min via INTRAVENOUS

## 2017-03-13 MED ORDER — FENTANYL CITRATE (PF) 100 MCG/2ML IJ SOLN
25.0000 ug | Freq: Once | INTRAMUSCULAR | Status: AC
  Administered 2017-03-13: 25 ug via INTRAVENOUS

## 2017-03-13 MED ORDER — FENTANYL CITRATE (PF) 100 MCG/2ML IJ SOLN
INTRAMUSCULAR | Status: AC
  Filled 2017-03-13: qty 2

## 2017-03-13 MED ORDER — MIDAZOLAM HCL 2 MG/2ML IJ SOLN
1.0000 mg | INTRAMUSCULAR | Status: AC
Start: 2017-03-13 — End: 2017-03-13
  Administered 2017-03-13 (×2): 2 mg via INTRAVENOUS

## 2017-03-13 MED ORDER — PROPOFOL 10 MG/ML IV BOLUS
INTRAVENOUS | Status: DC | PRN
  Administered 2017-03-13 (×2): 50 mg via INTRAVENOUS

## 2017-03-13 MED ORDER — LACTATED RINGERS IV SOLN
INTRAVENOUS | Status: DC
  Administered 2017-03-13: 10:00:00 via INTRAVENOUS

## 2017-03-13 NOTE — OR Nursing (Signed)
Extremly nervous, preop meds started

## 2017-03-13 NOTE — Discharge Instructions (Signed)
You have internal AND EXTERNAL hemorrhoids, and HAD 1 small polyp removed. You have gastritis AND STOMACH POLYPS. I biopsied your stomach.   DRINK WATER TO KEEP YOUR URINE LIGHT YELLOW.  CONTINUE YOUR WEIGHT LOSS EFFORTS. A WEIGHT OF 183 LBS   WILL GET YOUR BODY MASS INDEX(BMI) UNDER 30.  WHILE I DO NOT WANT TO ALARM YOU, YOUR BODY MASS INDEX IS OVER 30 WHICH MEANS YOU ARE OBESE. OBESITY CAN ACTIVATE CANCER GENES. OBESITY IS ASSOCIATED WITH AN INCREASED RISK FOR CIRRHOSIS AND ALL CANCERS, INCLUDING ESOPHAGEAL AND COLON CANCER.  FOLLOW A HIGH FIBER/LOW FAT DIET. AVOID ITEMS THAT CAUSE BLOATING. SEE INFO BELOW.  AVOID REFLUX TRIGGERS. SEE INFO BELOW.   CONTINUE AMITIZA. IT MAY CAUSE EXPLOSIVE DIARRHEA OR NAUSEA.  CONTINUE PROTONIX. TAKE 30 MINUTES PRIOR TO BREAKFAST EVERY DAY.   YOUR BIOPSY RESULTS WILL BE AVAILABLE IN MY CHART AFTER NOV 3 AND MY OFFICE WILL CONTACT YOU IN 10-14 DAYS WITH YOUR RESULTS.   FOLLOW UP IN 4 MOS.   Next colonoscopy in 5 years.   ENDOSCOPY Care After Read the instructions outlined below and refer to this sheet in the next week. These discharge instructions provide you with general information on caring for yourself after you leave the hospital. While your treatment has been planned according to the most current medical practices available, unavoidable complications occasionally occur. If you have any problems or questions after discharge, call DR. Siobahn Worsley, [phone].  ACTIVITY  You may resume your regular activity, but move at a slower pace for the next 24 hours.   Take frequent rest periods for the next 24 hours.   Walking will help get rid of the air and reduce the bloated feeling in your belly (abdomen).   No driving for 24 hours (because of the medicine (anesthesia) used during the test).   You may shower.   Do not sign any important legal documents or operate any machinery for 24 hours (because of the anesthesia used during the test).     NUTRITION  Drink plenty of fluids.   You may resume your normal diet as instructed by your doctor.   Begin with a light meal and progress to your normal diet. Heavy or fried foods are harder to digest and may make you feel sick to your stomach (nauseated).   Avoid alcoholic beverages for 24 hours or as instructed.    MEDICATIONS  You may resume your normal medications.   WHAT YOU CAN EXPECT TODAY  Some feelings of bloating in the abdomen.   Passage of more gas than usual.   Spotting of blood in your stool or on the toilet paper  .  IF YOU HAD POLYPS REMOVED DURING THE ENDOSCOPY:  Eat a soft diet IF YOU HAVE NAUSEA, BLOATING, ABDOMINAL PAIN, OR VOMITING.    FINDING OUT THE RESULTS OF YOUR TEST Not all test results are available during your visit. DR. Oneida Alar WILL CALL YOU WITHIN 14 DAYS OF YOUR PROCEDUE WITH YOUR RESULTS. Do not assume everything is normal if you have not heard from DR. Rylann Munford, CALL HER OFFICE AT [phone].  SEEK IMMEDIATE MEDICAL ATTENTION AND CALL THE OFFICE: [phone] IF:  You have more than a spotting of blood in your stool.   Your belly is swollen (abdominal distention).   You are nauseated or vomiting.   You have a temperature over 101F.   You have abdominal pain or discomfort that is severe or gets worse throughout the day.  Polyps, Colon  A polyp  is extra tissue that grows inside your body. Colon polyps grow in the large intestine. The large intestine, also called the colon, is part of your digestive system. It is a long, hollow tube at the end of your digestive tract where your body makes and stores stool. Most polyps are not dangerous. They are benign. This means they are not cancerous. But over time, some types of polyps can turn into cancer. Polyps that are smaller than a pea are usually not harmful. But larger polyps could someday become or may already be cancerous. To be safe, doctors remove all polyps and test them.    PREVENTION There is not one sure way to prevent polyps. You might be able to lower your risk of getting them if you:  Eat more fruits and vegetables and less fatty food.   Do not smoke.   Avoid alcohol.   Exercise every day.   Lose weight if you are overweight.   Eating more calcium and folate can also lower your risk of getting polyps. Some foods that are rich in calcium are milk, cheese, and broccoli. Some foods that are rich in folate are chickpeas, kidney beans, and spinach.    Gastritis  Gastritis is an inflammation (the body's way of reacting to injury and/or infection) of the stomach. It is often caused by viral or bacterial (germ) infections. It can also be caused BY ASPIRIN, BC/GOODY POWDER'S, (IBUPROFEN) MOTRIN, OR ALEVE (NAPROXEN), chemicals (including alcohol), SPICY FOODS, and medications. This illness may be associated with generalized malaise (feeling tired, not well), UPPER ABDOMINAL STOMACH cramps, and fever. One common bacterial cause of gastritis is an organism known as H. Pylori. This can be treated with antibiotics.    High-Fiber Diet A high-fiber diet changes your normal diet to include more whole grains, legumes, fruits, and vegetables. Changes in the diet involve replacing refined carbohydrates with unrefined foods. The calorie level of the diet is essentially unchanged. The Dietary Reference Intake (recommended amount) for adult males is 38 grams per day. For adult females, it is 25 grams per day. Pregnant and lactating women should consume 28 grams of fiber per day. Fiber is the intact part of a plant that is not broken down during digestion. Functional fiber is fiber that has been isolated from the plant to provide a beneficial effect in the body. PURPOSE  Increase stool bulk.   Ease and regulate bowel movements.   Lower cholesterol.   REDUCE RISK OF COLON CANCER  INDICATIONS THAT YOU NEED MORE FIBER  Constipation and hemorrhoids.   Uncomplicated  diverticulosis (intestine condition) and irritable bowel syndrome.   Weight management.   As a protective measure against hardening of the arteries (atherosclerosis), diabetes, and cancer.  INCLUDE A VARIETY OF FIBER SOURCES GUIDELINES FOR INCREASING FIBER IN THE DIET  Start adding fiber to the diet slowly. A gradual increase of about 5 more grams (2 slices of whole-wheat bread, 2 servings of most fruits or vegetables, or 1 bowl of high-fiber cereal) per day is best. Too rapid an increase in fiber may result in constipation, flatulence, and bloating.   Drink enough water and fluids to keep your urine clear or pale yellow. Water, juice, or caffeine-free drinks are recommended. Not drinking enough fluid may cause constipation.   Eat a variety of high-fiber foods rather than one type of fiber.   Try to increase your intake of fiber through using high-fiber foods rather than fiber pills or supplements that contain small amounts of fiber.  The goal is to change the types of food eaten. Do not supplement your present diet with high-fiber foods, but replace foods in your present diet.    Low-Fat Diet BREADS, CEREALS, PASTA, RICE, DRIED PEAS, AND BEANS These products are high in carbohydrates and most are low in fat. Therefore, they can be increased in the diet as substitutes for fatty foods. They too, however, contain calories and should not be eaten in excess. Cereals can be eaten for snacks as well as for breakfast.  Include foods that contain fiber (fruits, vegetables, whole grains, and legumes). Research shows that fiber may lower blood cholesterol levels, especially the water-soluble fiber found in fruits, vegetables, oat products, and legumes. FRUITS AND VEGETABLES It is good to eat fruits and vegetables. Besides being sources of fiber, both are rich in vitamins and some minerals. They help you get the daily allowances of these nutrients. Fruits and vegetables can be used for snacks and  desserts. MEATS Limit lean meat, chicken, [place], and fish to no more than 6 ounces per day. Beef, Pork, and Lamb Use lean cuts of beef, pork, and lamb. Lean cuts include:  Extra-lean ground beef.  Arm roast.  Sirloin tip.  Center-cut ham.  Round steak.  Loin chops.  Rump roast.  Tenderloin.  Trim all fat off the outside of meats before cooking. It is not necessary to severely decrease the intake of red meat, but lean choices should be made. Lean meat is rich in protein and contains a highly absorbable form of iron. Premenopausal women, in particular, should avoid reducing lean red meat because this could increase the risk for low red blood cells (iron-deficiency anemia). The organ meats, such as liver, sweetbreads, kidneys, and brain are very rich in cholesterol. They should be limited. Chicken and [place] These are good sources of protein. The fat of poultry can be reduced by removing the skin and underlying fat layers before cooking. Chicken and [place] can be substituted for lean red meat in the diet. Poultry should not be fried or covered with high-fat sauces. Fish and Shellfish Fish is a good source of protein. Shellfish contain cholesterol, but they usually are low in saturated fatty acids. The preparation of fish is important. Like chicken and [place], they should not be fried or covered with high-fat sauces. EGGS Egg whites contain no fat or cholesterol. They can be eaten often. Try 1 to 2 egg whites instead of whole eggs in recipes or use egg substitutes that do not contain yolk. MILK AND DAIRY PRODUCTS Use skim or 1% milk instead of 2% or whole milk. Decrease whole milk, natural, and processed cheeses. Use nonfat or low-fat (2%) cottage cheese or low-fat cheeses made from vegetable oils. Choose nonfat or low-fat (1 to 2%) yogurt. Experiment with evaporated skim milk in recipes that call for heavy cream. Substitute low-fat yogurt or low-fat cottage cheese for sour cream in dips and salad  dressings. Have at least 2 servings of low-fat dairy products, such as 2 glasses of skim (or 1%) milk each day to help get your daily calcium intake.  FATS AND OILS Reduce the total intake of fats, especially saturated fat. Butterfat, lard, and beef fats are high in saturated fat and cholesterol. These should be avoided as much as possible. Vegetable fats do not contain cholesterol, but certain vegetable fats, such as coconut oil, palm oil, and palm kernel oil are very high in saturated fats. These should be limited. These fats are often used in [employer],  processed foods, popcorn, oils, and nondairy creamers. Vegetable shortenings and some peanut butters contain hydrogenated oils, which are also saturated fats. Read the labels on these foods and check for saturated vegetable oils. Unsaturated vegetable oils and fats do not raise blood cholesterol. However, they should be limited because they are fats and are high in calories. Total fat should still be limited to 30% of your daily caloric intake. Desirable liquid vegetable oils are corn oil, cottonseed oil, olive oil, canola oil, safflower oil, soybean oil, and sunflower oil. Peanut oil is not as good, but small amounts are acceptable. Buy a heart-healthy tub margarine that has no partially hydrogenated oils in the ingredients. Mayonnaise and salad dressings often are made from unsaturated fats, but they should also be limited because of their high calorie and fat content. Seeds, nuts, peanut butter, olives, and avocados are high in fat, but the fat is mainly the unsaturated type. These foods should be limited mainly to avoid excess calories and fat. OTHER EATING TIPS Snacks  Most sweets should be limited as snacks. They tend to be rich in calories and fats, and their caloric content outweighs their nutritional value. Some good choices in snacks are graham crackers, melba toast, soda crackers, bagels (no egg), English muffins, fruits, and vegetables.  These snacks are preferable to snack crackers, [place] fries, and chips. Popcorn should be air-popped or cooked in small amounts of liquid vegetable oil. Desserts Eat fruit, low-fat yogurt, and fruit ices. AVOID pastries, cake, and cookies. Sherbet, angel food cake, gelatin dessert, frozen low-fat yogurt, or other frozen products that do not contain saturated fat (pure fruit juice bars, frozen ice pops) are also acceptable.  COOKING METHODS Choose those methods that use little or no fat. They include: Poaching.  Braising.  Steaming.  Grilling.  Baking.  Stir-frying.  Broiling.  Microwaving.  Foods can be cooked in a nonstick pan without added fat, or use a nonfat cooking spray in regular cookware. Limit fried foods and avoid frying in saturated fat. Add moisture to lean meats by using water, broth, cooking wines, and other nonfat or low-fat sauces along with the cooking methods mentioned above. Soups and stews should be chilled after cooking. The fat that forms on top after a few hours in the refrigerator should be skimmed off. When preparing meals, avoid using excess salt. Salt can contribute to raising blood pressure in some people. EATING AWAY FROM HOME Order entres, potatoes, and vegetables without sauces or butter. When meat exceeds the size of a deck of cards (3 to 4 ounces), the rest can be taken home for another meal. Choose vegetable or fruit salads and ask for low-calorie salad dressings to be served on the side. Use dressings sparingly. Limit high-fat toppings, such as bacon, crumbled eggs, cheese, sunflower seeds, and olives. Ask for heart-healthy tub margarine instead of butter.   Hemorrhoids Hemorrhoids are dilated (enlarged) veins around the rectum. Sometimes clots will form in the veins. This makes them swollen and painful. These are called thrombosed hemorrhoids. Causes of hemorrhoids include:  Constipation.   Straining to have a bowel movement.   HEAVY LIFTING HOME CARE  INSTRUCTIONS  Eat a well balanced diet and drink 6 to 8 glasses of water every day to avoid constipation. You may also use a bulk laxative.   Avoid straining to have bowel movements.   Keep anal area dry and clean.   Do not use a donut shaped pillow or sit on the toilet for long periods. This increases  blood pooling and pain.   Move your bowels when your body has the urge; this will require less straining and will decrease pain and pressure.

## 2017-03-13 NOTE — Op Note (Signed)
[facility] Patient Name: Erika Ruiz Procedure Date: 03/13/2017 10:10 AM MRN: [id number] Date of Birth: [birth year] Attending MD: Barney Drain MD, MD CSN: [id number] Age: 51 Admit Type: Outpatient Procedure:                Upper GI endoscopy WITH COLD FORCEPS BIOPSY Indications:              Dyspepsia ON IBUPROFEN. IMPROVED WITH PROTONIX.                            HASN'T TRIED LINZESS YET. Providers:                Barney Drain MD, MD, Gwenlyn Fudge RN, RN, Aram Candela Referring MD:             Octavio Graves MD, MD Medicines:                Propofol per Anesthesia Complications:            No immediate complications. Estimated Blood Loss:     Estimated blood loss was minimal. Procedure:                Pre-Anesthesia Assessment:                           - Prior to the procedure, a History and Physical                            was performed, and patient medications and                            allergies were reviewed. The patient's tolerance of                            previous anesthesia was also reviewed. The risks                            and benefits of the procedure and the sedation                            options and risks were discussed with the patient.                            All questions were answered, and informed consent                            was obtained. Prior Anticoagulants: The patient has                            taken ibuprofen, last dose was 7 days prior to                            procedure. ASA Grade Assessment: II - A patient  with mild systemic disease. After reviewing the                            risks and benefits, the patient was deemed in                            satisfactory condition to undergo the procedure.                            After obtaining informed consent, the endoscope was                            passed under direct vision. Throughout the                procedure, the patient's blood pressure, pulse, and                            oxygen saturations were monitored continuously. The                            [id number] ([id number]) scope was introduced through the                            mouth, and advanced to the second part of duodenum.                            The upper GI endoscopy was accomplished without                            difficulty. The patient tolerated the procedure                            fairly well. Scope In: 11:04:24 AM Scope Out: 11:10:50 AM Total Procedure Duration: 0 hours 6 minutes 26 seconds  Findings:      The examined esophagus was normal.      A small hiatal hernia was present.      A few 3 to 5 mm sessile polyps with no stigmata of recent bleeding were       found on the greater curvature of the stomach. The polyp was removed       with a cold biopsy forceps. Resection and retrieval were complete.      Diffuse mild inflammation characterized by congestion (edema) and       erythema was found in the gastric antrum. Biopsies were taken with a       cold forceps for Helicobacter pylori testing.      The examined duodenum was normal. Impression:               - Small hiatal hernia.                           - A few gastric polyps.                           - MILD Gastritis. Biopsied. Moderate Sedation:      Per Anesthesia Care  Recommendation:           - Await pathology results.                           - High fiber diet and low fat diet.                           - Continue present medications.                           - Return to my office in 4 months.                           - Patient has a contact number available for                            emergencies. The signs and symptoms of potential                            delayed complications were discussed with the                            patient. Return to normal activities tomorrow.                            Written discharge  instructions were provided to the                            patient. Procedure Code(s):        --- Professional ---                           [phone], Esophagogastroduodenoscopy, flexible,                            transoral; with biopsy, single or multiple Diagnosis Code(s):        --- Professional ---                           K44.9, Diaphragmatic hernia without obstruction or                            gangrene                           K31.7, Polyp of stomach and duodenum                           K29.70, Gastritis, unspecified, without bleeding                           R10.13, Epigastric pain CPT copyright 2016 [employer]. All rights reserved. The codes documented in this report are preliminary and upon coder review may  be revised to meet current compliance requirements. Barney Drain, MD Barney Drain MD, MD 03/13/2017 11:19:30 AM This report has been signed electronically. Number of Addenda: 0

## 2017-03-13 NOTE — Progress Notes (Signed)
This note also relates to the following rows which could not be included:  BP - Cannot attach notes to unvalidated device data

## 2017-03-13 NOTE — Op Note (Signed)
[facility] Patient Name: Erika Ruiz Procedure Date: 03/13/2017 10:06 AM MRN: [id number] Date of Birth: 06/16/1965 Attending MD: Barney Drain MD, MD CSN: [id number] Age: 51 Admit Type: Outpatient Procedure:                Colonoscopy WITH COLD SNARE POLYPECTOMY Indications:              Screening in patient at increased risk: Family                            history of 1st-degree relative with colorectal                            cancer before age 65 years Providers:                Barney Drain MD, MD, Otis Peak B. Sharon Seller, RN, Aram Candela Referring MD:             Octavio Graves MD, MD Medicines:                Propofol per Anesthesia Complications:            No immediate complications. Estimated Blood Loss:     Estimated blood loss was minimal. Procedure:                Pre-Anesthesia Assessment:                           - Prior to the procedure, a History and Physical                            was performed, and patient medications and                            allergies were reviewed. The patient's tolerance of                            previous anesthesia was also reviewed. The risks                            and benefits of the procedure and the sedation                            options and risks were discussed with the patient.                            All questions were answered, and informed consent                            was obtained. Prior Anticoagulants: The patient has                            taken ibuprofen, last dose was 7 days prior to  procedure. ASA Grade Assessment: II - A patient                            with mild systemic disease. After reviewing the                            risks and benefits, the patient was deemed in                            satisfactory condition to undergo the procedure.                            After obtaining informed consent, the colonoscope                 was passed under direct vision. Throughout the                            procedure, the patient's blood pressure, pulse, and                            oxygen saturations were monitored continuously. The                            EC-[id number] ([id number]) scope was introduced through                            the anus and advanced to the the cecum, identified                            by appendiceal orifice and ileocecal valve. The                            colonoscopy was technically difficult and complex                            due to restricted mobility of the colon. Successful                            completion of the procedure was aided by COLOWRAP.                            The patient tolerated the procedure fairly well.                            The quality of the bowel preparation was good. The                            ileocecal valve, appendiceal orifice, and rectum                            were photographed. Scope In: 10:45:01 AM Scope Out: 10:59:40 AM Scope Withdrawal Time: 0 hours 9 minutes 43 seconds  Total Procedure Duration: 0 hours 14 minutes 39 seconds  Findings:  A 4 mm polyp was found in the rectum. The polyp was sessile. The polyp       was removed with a cold snare. Resection and retrieval were complete.      The recto-sigmoid colon and sigmoid colon were significantly redundant.      External and internal hemorrhoids were found during retroflexion. The       hemorrhoids were moderate. Impression:               - One 4 mm polyp in the rectum, removed with a cold                            snare. Resected and retrieved.                           - Redundant colon.                           - External and internal hemorrhoids. Moderate Sedation:      Per Anesthesia Care Recommendation:           - Repeat colonoscopy in 5 years for surveillance.                           - High fiber diet and low fat diet.                           -  Continue present medications.                           - Await pathology results.                           - Return to my office in 4 months.                           - Patient has a contact number available for                            emergencies. The signs and symptoms of potential                            delayed complications were discussed with the                            patient. Return to normal activities tomorrow.                            Written discharge instructions were provided to the                            patient. Procedure Code(s):        --- Professional ---                           [phone], Colonoscopy, flexible; with removal of  tumor(s), polyp(s), or other lesion(s) by snare                            technique Diagnosis Code(s):        --- Professional ---                           Z80.0, Family history of malignant neoplasm of                            digestive organs                           K62.1, Rectal polyp                           K64.8, Other hemorrhoids                           Q43.8, Other specified congenital malformations of                            intestine CPT copyright 2016 [employer]. All rights reserved. The codes documented in this report are preliminary and upon coder review may  be revised to meet current compliance requirements. Barney Drain, MD Barney Drain MD, MD 03/13/2017 11:24:11 AM This report has been signed electronically. Number of Addenda: 0

## 2017-03-13 NOTE — H&P (Signed)
  Primary Care Physician:  Octavio Graves, DO Primary Gastroenterologist:  Dr. Oneida Alar  Pre-Procedure History & Physical: HPI:  Erika Ruiz is a 51 y.o. female here for [facility].  Past Medical History:  Diagnosis Date  . Hyperlipemia   . Hypertension   . Uterine cancer ([place]) 1987  . Vitamin D deficiency disease     Past Surgical History:  Procedure Laterality Date  . ABDOMINAL HYSTERECTOMY     due to CA  . BREAST LUMPECTOMY Left   . LEFT OOPHORECTOMY    . VAGINAL DELIVERY     X2    Prior to Admission medications   Medication Sig Start Date End Date Taking? Authorizing Provider  albuterol (PROVENTIL HFA;VENTOLIN HFA) 108 (90 BASE) MCG/ACT inhaler Inhale 2 puffs into the lungs every 4 (four) hours as needed for wheezing or shortness of breath. 05/11/14  Yes Ward, Delice Bison, DO  ibuprofen (ADVIL,MOTRIN) 200 MG tablet Take 400 mg by mouth every 6 (six) hours as needed for headache or moderate pain.   Yes [provider]  Oxycodone HCl 20 MG TABS Take 20 mg by mouth daily as needed (for pain).  02/09/17  Yes [provider]  pantoprazole (PROTONIX) 40 MG tablet Take 1 tablet (40 mg total) by mouth daily. 30 minutes prior to breakfast Patient taking differently: Take 40 mg by mouth daily as needed (for acid reflux).  02/14/17  Yes Annitta Needs, NP  telmisartan-hydrochlorothiazide (MICARDIS HCT) 40-12.5 MG per tablet Take 1 tablet by mouth daily.   Yes [provider]  polyethylene glycol-electrolytes (TRILYTE) 420 g solution Take 4,000 mLs by mouth as directed. 02/14/17   Danie Binder, MD    Allergies as of 02/14/2017 - Review Complete 02/14/2017  Allergen Reaction Noted  . Sulfa antibiotics Rash 07/10/2012    Family History  Problem Relation Age of Onset  . Ovarian cancer Paternal Aunt 11  . Colon cancer Sister 41  . Colon polyps Brother   . Diabetes Father   . Heart disease Father   . Kidney disease Father   . Breast cancer Mother      Social History   Social History  . Marital status: Divorced    Spouse name: N/A  . Number of children: 2  . Years of education: N/A   Occupational History  . training instructor [employer]   Social History Main Topics  . Smoking status: Never Smoker  . Smokeless tobacco: Never Used  . Alcohol use No  . Drug use: No  . Sexual activity: Yes   Other Topics Concern  . Not on file   Social History Narrative  . No narrative on file    Review of Systems: See HPI, otherwise negative ROS   Physical Exam: Ht 5\' 6"  (1.676 m)   Wt 195 lb (88.5 kg)   BMI 31.47 kg/m  General:   Alert,  pleasant and cooperative in NAD Head:  Normocephalic and atraumatic. Neck:  Supple; Lungs:  Clear throughout to auscultation.    Heart:  Regular rate and rhythm. Abdomen:  Soft, nontender and nondistended. Normal bowel sounds, without guarding, and without rebound.   Neurologic:  Alert and  oriented x4;  grossly normal neurologically.  Impression/Plan:    SCREENING/dyspepsia  Plan:  1. TCS/EGD TODAY DISCUSSED PROCEDURE, BENEFITS, & RISKS: < 1% chance of medication reaction, bleeding, perforation, or rupture of spleen/liver.

## 2017-03-13 NOTE — Anesthesia Preprocedure Evaluation (Signed)
Anesthesia Evaluation  Patient identified by MRN, date of birth, ID band Patient awake    Reviewed: Allergy & Precautions, NPO status , Patient's Chart, lab work & pertinent test results  Airway Mallampati: II  TM Distance: >3 FB Neck ROM: Full    Dental  (+) Teeth Intact   Pulmonary    breath sounds clear to auscultation       Cardiovascular hypertension,  Rhythm:Regular Rate:Normal     Neuro/Psych    GI/Hepatic negative GI ROS, Neg liver ROS,   Endo/Other  negative endocrine ROS  Renal/GU negative Renal ROS     Musculoskeletal negative musculoskeletal ROS (+)   Abdominal   Peds  Hematology negative hematology ROS (+)   Anesthesia Other Findings   Reproductive/Obstetrics                             Anesthesia Physical Anesthesia Plan  ASA: II  Anesthesia Plan: MAC   Post-op Pain Management:    Induction: Intravenous  PONV Risk Score and Plan:   Airway Management Planned: Simple Face Mask  Additional Equipment:   Intra-op Plan:   Post-operative Plan:   Informed Consent: I have reviewed the patients History and Physical, chart, labs and discussed the procedure including the risks, benefits and alternatives for the proposed anesthesia with the patient or authorized representative who has indicated his/her understanding and acceptance.     Plan Discussed with:   Anesthesia Plan Comments:         Anesthesia Quick Evaluation

## 2017-03-13 NOTE — Transfer of Care (Signed)
Immediate Anesthesia Transfer of Care Note  Patient: Erika Ruiz  Procedure(s) Performed: COLONOSCOPY WITH PROPOFOL (N/A ) ESOPHAGOGASTRODUODENOSCOPY (EGD) WITH PROPOFOL (N/A ) POLYPECTOMY BIOPSY  Patient Location: PACU  Anesthesia Type:MAC  Level of Consciousness: awake, alert  and oriented  Airway & Oxygen Therapy: Patient Spontanous Breathing and Patient connected to nasal cannula oxygen  Post-op Assessment: Report given to RN, Post -op Vital signs reviewed and stable and Patient moving all extremities X 4  Post vital signs: Reviewed and stable  Last Vitals:  Vitals:   03/13/17 1025 03/13/17 1030  BP: (!) 221/104 (!) 173/94  Resp: 13 (!) 0  SpO2: 100% 97%    Last Pain: There were no vitals filed for this visit.    Patients Stated Pain Goal: 7 (89/37/34 2876)  Complications: No apparent anesthesia complications

## 2017-03-13 NOTE — Anesthesia Postprocedure Evaluation (Signed)
Anesthesia Post Note  Patient: Erika Ruiz  Procedure(s) Performed: COLONOSCOPY WITH PROPOFOL (N/A ) ESOPHAGOGASTRODUODENOSCOPY (EGD) WITH PROPOFOL (N/A ) POLYPECTOMY BIOPSY  Patient location during evaluation: Endoscopy Anesthesia Type: MAC Level of consciousness: awake and alert Pain management: pain level controlled Vital Signs Assessment: post-procedure vital signs reviewed and stable Respiratory status: spontaneous breathing, nonlabored ventilation, respiratory function stable and patient connected to nasal cannula oxygen Cardiovascular status: blood pressure returned to baseline and stable Postop Assessment: no apparent nausea or vomiting Anesthetic complications: no     Last Vitals:  Vitals:   03/13/17 1030 03/13/17 1120  BP: (!) 173/94 (!) 149/91  Pulse:  (!) 113  Resp: (!) 0 19  Temp:  37.2 C  SpO2: 97% 99%    Last Pain: There were no vitals filed for this visit.               Talitha Givens

## 2017-03-15 ENCOUNTER — Encounter (HOSPITAL_COMMUNITY): Payer: Self-pay | Admitting: Gastroenterology

## 2017-03-19 ENCOUNTER — Telehealth: Payer: Self-pay | Admitting: Gastroenterology

## 2017-03-19 NOTE — Telephone Encounter (Signed)
Pt called to follow up on her results from her procedure last Tuesday. Please call [phone]

## 2017-03-20 NOTE — Telephone Encounter (Signed)
SEE RESULT NOTE 

## 2017-03-20 NOTE — Telephone Encounter (Signed)
Pt is aware we have 7-10 business days to call with results. Forwarding to Dr. Oneida Alar.

## 2017-03-21 NOTE — Progress Notes (Signed)
Pt is aware.  

## 2017-03-22 NOTE — Progress Notes (Signed)
PATIENT SCHEDULED  °

## 2017-03-30 ENCOUNTER — Inpatient Hospital Stay: Admission: RE | Admit: 2017-03-30 | Payer: BLUE CROSS/BLUE SHIELD | Source: Ambulatory Visit

## 2017-04-16 ENCOUNTER — Ambulatory Visit: Payer: BLUE CROSS/BLUE SHIELD | Admitting: Gastroenterology

## 2017-04-30 ENCOUNTER — Ambulatory Visit: Payer: BLUE CROSS/BLUE SHIELD

## 2017-06-04 ENCOUNTER — Ambulatory Visit: Payer: BLUE CROSS/BLUE SHIELD

## 2017-06-06 ENCOUNTER — Ambulatory Visit: Payer: BLUE CROSS/BLUE SHIELD

## 2017-07-13 ENCOUNTER — Telehealth: Payer: Self-pay | Admitting: Gastroenterology

## 2017-07-13 ENCOUNTER — Ambulatory Visit: Payer: BLUE CROSS/BLUE SHIELD | Admitting: Gastroenterology

## 2017-07-13 ENCOUNTER — Encounter: Payer: Self-pay | Admitting: Gastroenterology

## 2017-07-13 NOTE — Telephone Encounter (Signed)
PATIENT WAS A NO SHOW AND LETTER SENT  °

## 2017-10-15 IMAGING — CT CT ABD-PELV W/O CM
2 of 3 series · 17 of 46 positions shown, 19 images · non-contrast
Comparison: 10/27/13

CLINICAL DATA: Diverticulitis.  Left lower quadrant chronic pain.

EXAM:
CT ABDOMEN AND PELVIS WITHOUT CONTRAST
TECHNIQUE: Multidetector CT imaging of the abdomen and pelvis was performed
following the standard protocol without IV contrast.

[Series 2: axial st · axial · 0.88mm/px · z∈[-540,-130]mm · 14 of 96 slices shown, 16 images]
[im 7/96  soft-tissue]
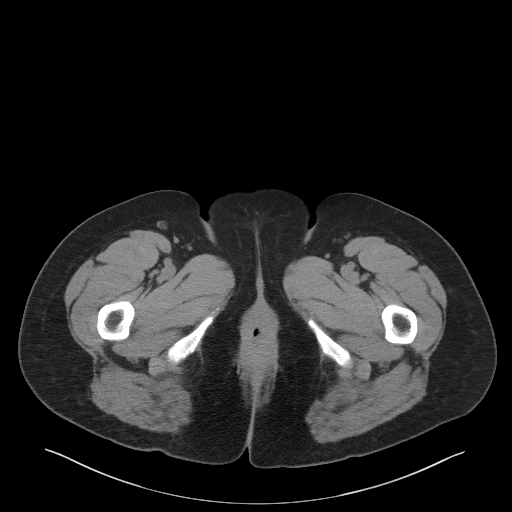
[im 7/96  bone]
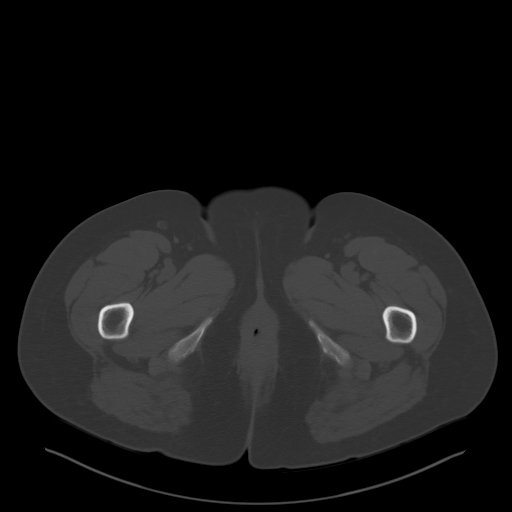
[im 13/96  soft-tissue]
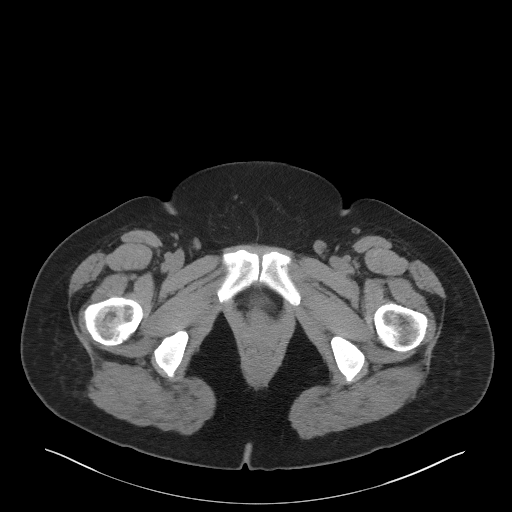
[im 19/96  soft-tissue]
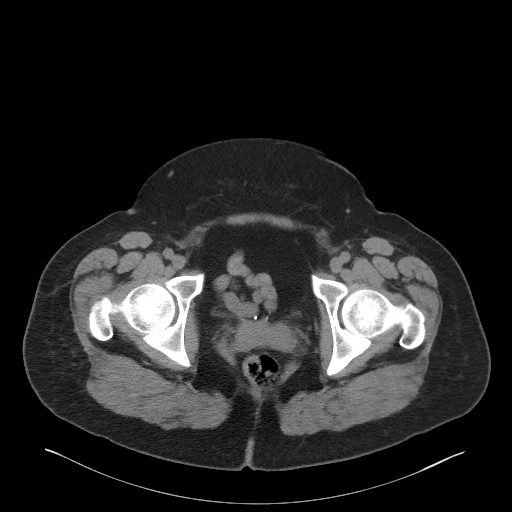
[im 25/96  soft-tissue]
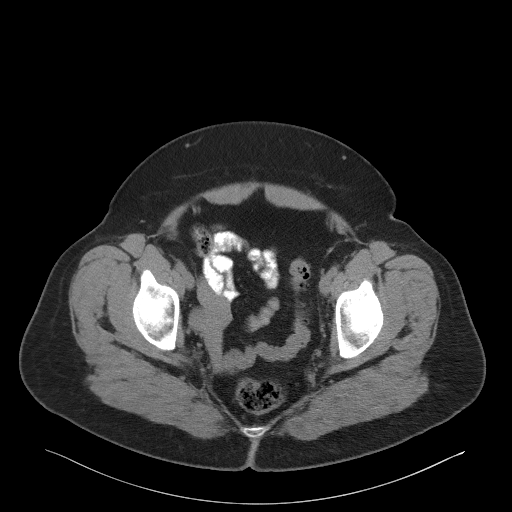
[im 31/96  soft-tissue]
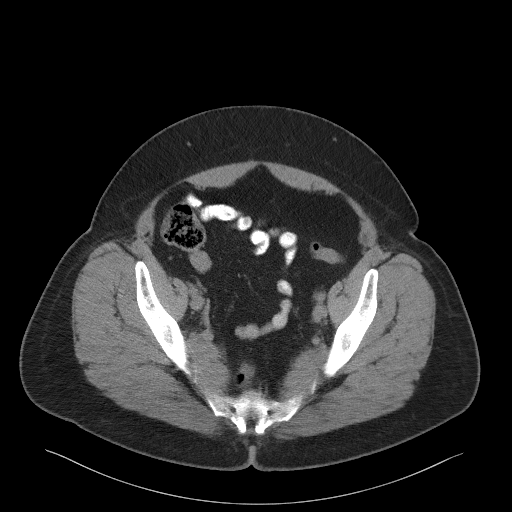
[im 37/96  soft-tissue]
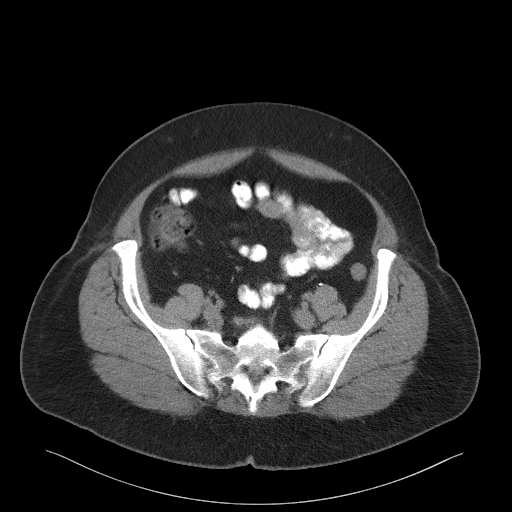
[im 43/96  soft-tissue]
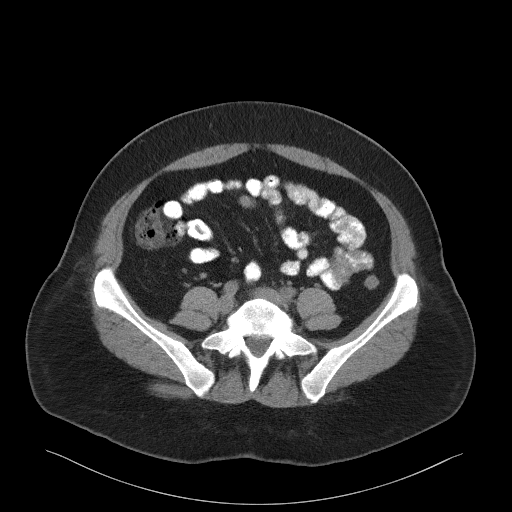
[im 53/96  soft-tissue]
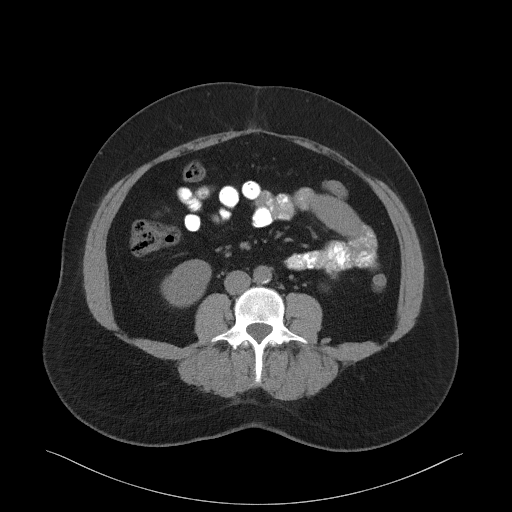
[im 59/96  soft-tissue]
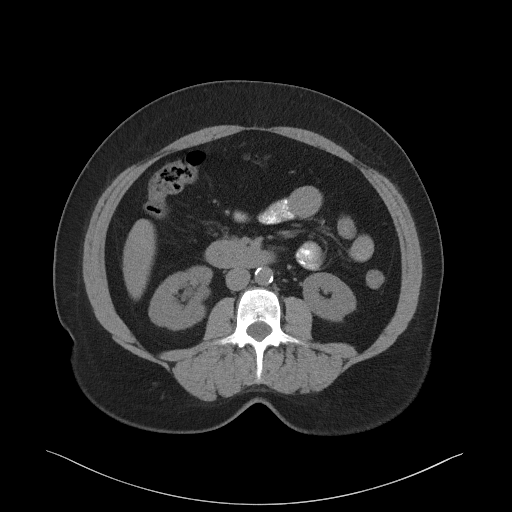
[im 59/96  bone]
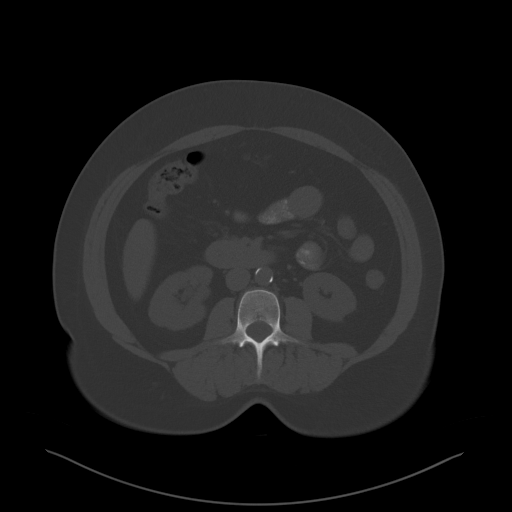
[im 65/96  soft-tissue]
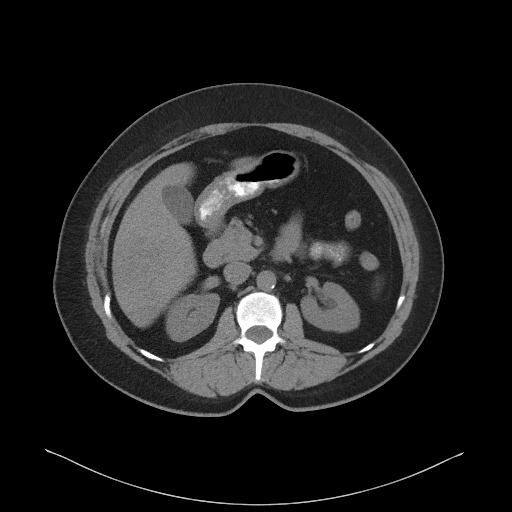
[im 71/96  soft-tissue]
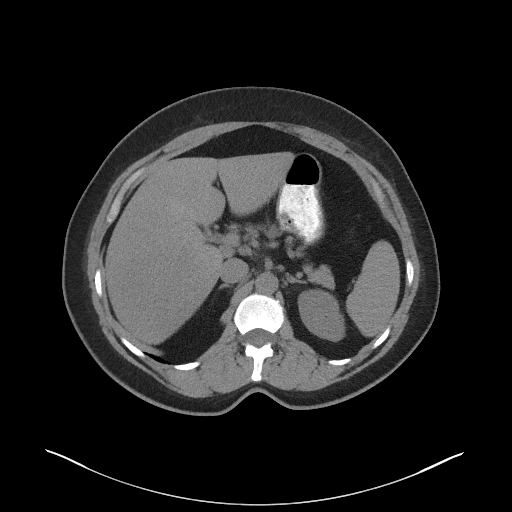
[im 77/96  soft-tissue]
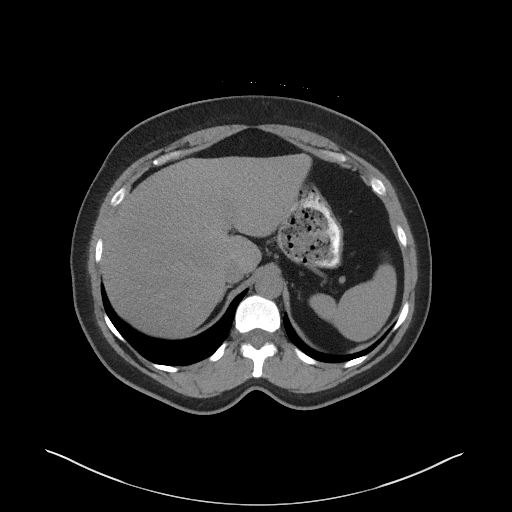
[im 83/96  soft-tissue]
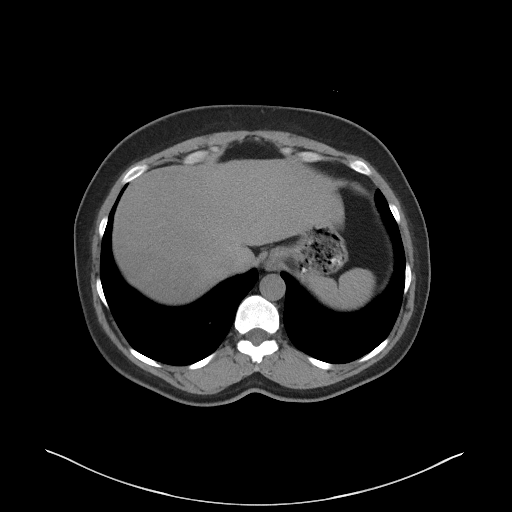
[im 89/96  soft-tissue]
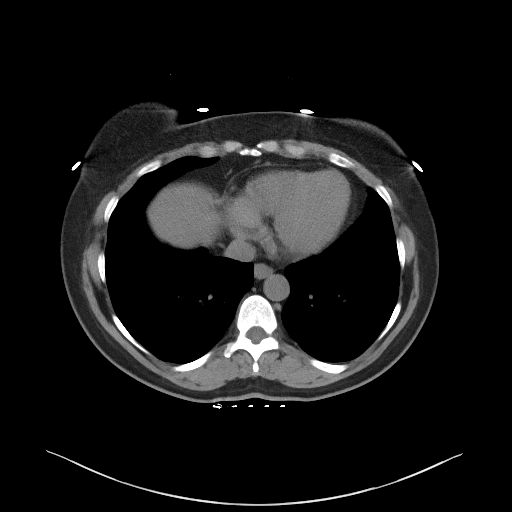

[Series 5: coronal st · coronal · 0.87mm/px · 3 of 138 slices shown]
[im 46/138  soft-tissue]
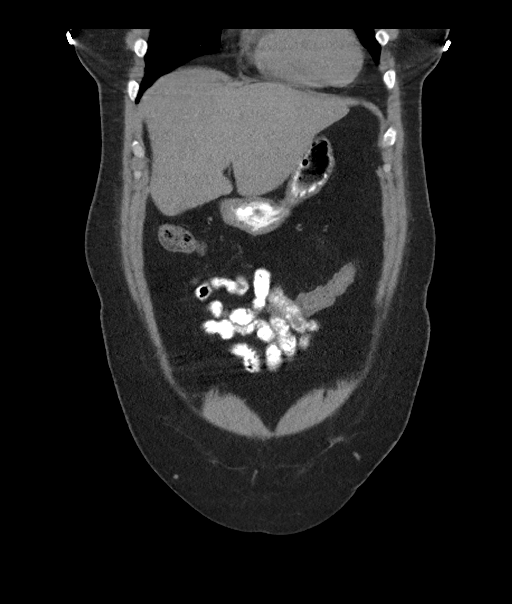
[im 61/138  soft-tissue]
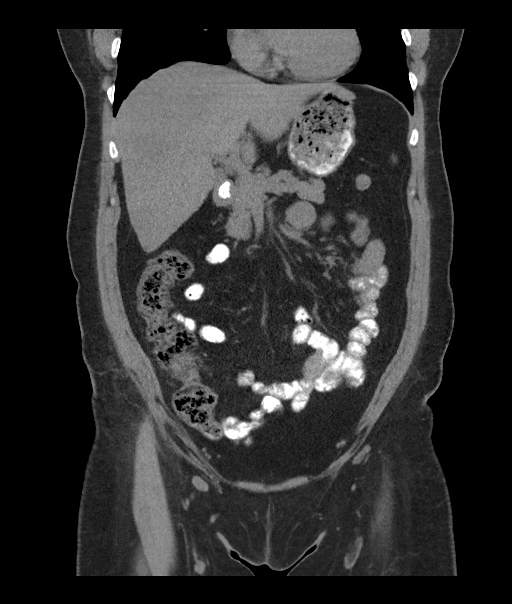
[im 77/138  soft-tissue]
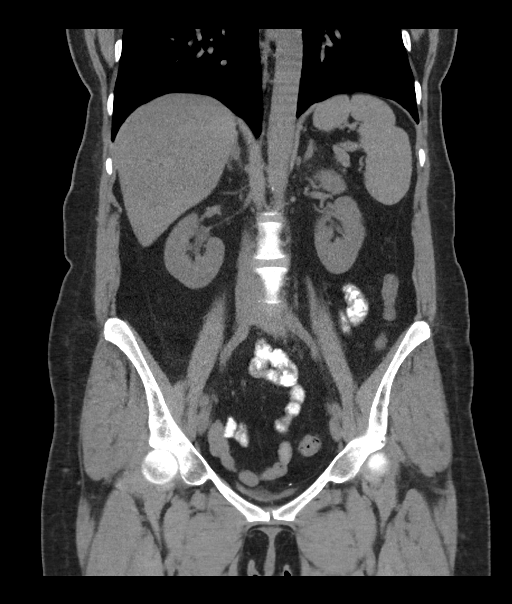

[17 of 46 positions shown; findings below may reference images not displayed]

FINDINGS: Lower chest: Lung bases are clear.

Hepatobiliary: No focal hepatic lesion. No biliary duct dilatation.
Gallbladder is normal. Common bile duct is normal.

Pancreas: Pancreas is normal. No ductal dilatation. No pancreatic
inflammation.

Spleen: Normal spleen

Adrenals/urinary tract: Adrenal glands and kidneys are normal. The
ureters and bladder normal.

Stomach/Bowel: Stomach, duodenum small-bowel normal. Appendix and
cecum normal. The ascending, transverse and descending colon are
normal. No evidence diverticulitis. Several diverticulum through the
sigmoid colon without acute inflammation.

Vascular/Lymphatic: Abdominal aorta is normal caliber with
atherosclerotic calcification. There is no retroperitoneal or
periportal lymphadenopathy. No pelvic lymphadenopathy.

Reproductive: Post hysterectomy. RIGHT ovary measures 2.7 cm. LEFT
ovary not identified

Other: No inguinal hernia or ventral hernia.

Musculoskeletal: No aggressive osseous lesion.
IMPRESSION: 1. No diverticulitis and mild diverticulosis
2. No explanation for LEFT lower quadrant pain.

## 2017-12-11 ENCOUNTER — Ambulatory Visit: Payer: BLUE CROSS/BLUE SHIELD

## 2017-12-27 ENCOUNTER — Ambulatory Visit (INDEPENDENT_AMBULATORY_CARE_PROVIDER_SITE_OTHER): Payer: Self-pay | Admitting: Orthopaedic Surgery

## 2019-01-31 ENCOUNTER — Other Ambulatory Visit: Payer: Self-pay | Admitting: Physician Assistant

## 2019-01-31 DIAGNOSIS — Z1231 Encounter for screening mammogram for malignant neoplasm of breast: Secondary | ICD-10-CM

## 2019-02-17 ENCOUNTER — Ambulatory Visit: Payer: BLUE CROSS/BLUE SHIELD

## 2019-03-18 ENCOUNTER — Other Ambulatory Visit: Payer: Self-pay | Admitting: Dermatology

## 2019-04-02 ENCOUNTER — Ambulatory Visit
Admission: RE | Admit: 2019-04-02 | Discharge: 2019-04-02 | Disposition: A | Discharge status: Home / Self Care | Payer: Commercial Managed Care - PPO | Source: Ambulatory Visit | Attending: Physician Assistant | Admitting: Physician Assistant

## 2019-04-02 ENCOUNTER — Other Ambulatory Visit: Payer: Self-pay

## 2019-04-02 DIAGNOSIS — Z1231 Encounter for screening mammogram for malignant neoplasm of breast: Secondary | ICD-10-CM

## 2020-09-15 ENCOUNTER — Emergency Department (HOSPITAL_COMMUNITY): Payer: Commercial Managed Care - PPO

## 2020-09-15 ENCOUNTER — Encounter (HOSPITAL_COMMUNITY): Payer: Self-pay

## 2020-09-15 ENCOUNTER — Inpatient Hospital Stay (HOSPITAL_COMMUNITY)
Admission: EM | Admit: 2020-09-15 | Discharge: 2020-09-17 | DRG: 066 | Disposition: A | Discharge status: Home / Self Care | Payer: Commercial Managed Care - PPO | Attending: Internal Medicine | Admitting: Internal Medicine

## 2020-09-15 ENCOUNTER — Other Ambulatory Visit: Payer: Self-pay

## 2020-09-15 DIAGNOSIS — E785 Hyperlipidemia, unspecified: Secondary | ICD-10-CM

## 2020-09-15 DIAGNOSIS — Z8542 Personal history of malignant neoplasm of other parts of uterus: Secondary | ICD-10-CM

## 2020-09-15 DIAGNOSIS — K219 Gastro-esophageal reflux disease without esophagitis: Secondary | ICD-10-CM

## 2020-09-15 DIAGNOSIS — Z66 Do not resuscitate: Secondary | ICD-10-CM | POA: Diagnosis present

## 2020-09-15 DIAGNOSIS — Z90710 Acquired absence of both cervix and uterus: Secondary | ICD-10-CM

## 2020-09-15 DIAGNOSIS — Z20822 Contact with and (suspected) exposure to covid-19: Secondary | ICD-10-CM | POA: Diagnosis present

## 2020-09-15 DIAGNOSIS — Z8041 Family history of malignant neoplasm of ovary: Secondary | ICD-10-CM

## 2020-09-15 DIAGNOSIS — I639 Cerebral infarction, unspecified: Secondary | ICD-10-CM

## 2020-09-15 DIAGNOSIS — Z833 Family history of diabetes mellitus: Secondary | ICD-10-CM

## 2020-09-15 DIAGNOSIS — I63512 Cerebral infarction due to unspecified occlusion or stenosis of left middle cerebral artery: Secondary | ICD-10-CM | POA: Diagnosis not present

## 2020-09-15 DIAGNOSIS — R4182 Altered mental status, unspecified: Secondary | ICD-10-CM | POA: Diagnosis not present

## 2020-09-15 DIAGNOSIS — Z79899 Other long term (current) drug therapy: Secondary | ICD-10-CM

## 2020-09-15 DIAGNOSIS — Z8673 Personal history of transient ischemic attack (TIA), and cerebral infarction without residual deficits: Secondary | ICD-10-CM

## 2020-09-15 DIAGNOSIS — I10 Essential (primary) hypertension: Secondary | ICD-10-CM

## 2020-09-15 DIAGNOSIS — Z803 Family history of malignant neoplasm of breast: Secondary | ICD-10-CM

## 2020-09-15 DIAGNOSIS — Z8249 Family history of ischemic heart disease and other diseases of the circulatory system: Secondary | ICD-10-CM

## 2020-09-15 DIAGNOSIS — Z841 Family history of disorders of kidney and ureter: Secondary | ICD-10-CM

## 2020-09-15 DIAGNOSIS — E782 Mixed hyperlipidemia: Secondary | ICD-10-CM | POA: Diagnosis present

## 2020-09-15 DIAGNOSIS — Z800 Family history of malignant neoplasm of digestive organs: Secondary | ICD-10-CM

## 2020-09-15 DIAGNOSIS — G8929 Other chronic pain: Secondary | ICD-10-CM

## 2020-09-15 DIAGNOSIS — G9389 Other specified disorders of brain: Secondary | ICD-10-CM | POA: Diagnosis present

## 2020-09-15 DIAGNOSIS — E559 Vitamin D deficiency, unspecified: Secondary | ICD-10-CM | POA: Diagnosis present

## 2020-09-15 DIAGNOSIS — Z8371 Family history of colonic polyps: Secondary | ICD-10-CM

## 2020-09-15 DIAGNOSIS — Z882 Allergy status to sulfonamides status: Secondary | ICD-10-CM

## 2020-09-15 LAB — DIFFERENTIAL
Abs Immature Granulocytes: 0.02 10*3/uL (ref 0.00–0.07)
Basophils Absolute: 0.1 10*3/uL (ref 0.0–0.1)
Basophils Relative: 1 %
Eosinophils Absolute: 0.1 10*3/uL (ref 0.0–0.5)
Eosinophils Relative: 1 %
Immature Granulocytes: 0 %
Lymphocytes Relative: 31 %
Lymphs Abs: 2.6 10*3/uL (ref 0.7–4.0)
Monocytes Absolute: 0.8 10*3/uL (ref 0.1–1.0)
Monocytes Relative: 9 %
Neutro Abs: 4.8 10*3/uL (ref 1.7–7.7)
Neutrophils Relative %: 58 %

## 2020-09-15 LAB — COMPREHENSIVE METABOLIC PANEL
ALT: 30 U/L (ref 0–44)
AST: 19 U/L (ref 15–41)
Albumin: 4.2 g/dL (ref 3.5–5.0)
Alkaline Phosphatase: 109 U/L (ref 38–126)
Anion gap: 7 (ref 5–15)
BUN: 13 mg/dL (ref 6–20)
CO2: 29 mmol/L (ref 22–32)
Calcium: 9.3 mg/dL (ref 8.9–10.3)
Chloride: 101 mmol/L (ref 98–111)
Creatinine, Ser: 0.83 mg/dL (ref 0.44–1.00)
GFR, Estimated: >60 mL/min (ref >60)
Glucose, Bld: 124 mg/dL — ABNORMAL HIGH (ref 70–99)
Potassium: 4.1 mmol/L (ref 3.5–5.1)
Sodium: 137 mmol/L (ref 135–145)
Total Bilirubin: 0.6 mg/dL (ref 0.3–1.2)
Total Protein: 7.8 g/dL (ref 6.5–8.1)

## 2020-09-15 LAB — CBC
HCT: 43.1 % (ref 36.0–46.0)
Hemoglobin: 14.5 g/dL (ref 12.0–15.0)
MCH: 30.5 pg (ref 26.0–34.0)
MCHC: 33.6 g/dL (ref 30.0–36.0)
MCV: 90.7 fL (ref 80.0–100.0)
Platelets: 318 10*3/uL (ref 150–400)
RBC: 4.75 MIL/uL (ref 3.87–5.11)
RDW: 13.2 % (ref 11.5–15.5)
WBC: 8.4 10*3/uL (ref 4.0–10.5)
nRBC: 0 % (ref 0.0–0.2)

## 2020-09-15 LAB — RAPID URINE DRUG SCREEN, HOSP PERFORMED
Amphetamines: NOT DETECTED
Barbiturates: NOT DETECTED
Benzodiazepines: NOT DETECTED
Cocaine: NOT DETECTED
Opiates: NOT DETECTED
Tetrahydrocannabinol: NOT DETECTED

## 2020-09-15 LAB — URINALYSIS, ROUTINE W REFLEX MICROSCOPIC
Bilirubin Urine: NEGATIVE
Glucose, UA: NEGATIVE mg/dL
Hgb urine dipstick: NEGATIVE
Ketones, ur: NEGATIVE mg/dL
Leukocytes,Ua: NEGATIVE
Nitrite: NEGATIVE
Protein, ur: NEGATIVE mg/dL
Specific Gravity, Urine: 1.016 (ref 1.005–1.030)
pH: 7 (ref 5.0–8.0)

## 2020-09-15 LAB — APTT: aPTT: 29 seconds (ref 24–36)

## 2020-09-15 LAB — PROTIME-INR
INR: 1 (ref 0.8–1.2)
Prothrombin Time: 12.6 seconds (ref 11.4–15.2)

## 2020-09-15 LAB — ETHANOL: Alcohol, Ethyl (B): <10 mg/dL (ref <10)

## 2020-09-15 MED ORDER — SODIUM CHLORIDE 0.9 % IV SOLN
100.0000 mL/h | INTRAVENOUS | Status: DC
  Administered 2020-09-15: 100 mL/h via INTRAVENOUS

## 2020-09-15 MED ORDER — SODIUM CHLORIDE 0.9 % IV BOLUS
500.0000 mL | Freq: Once | INTRAVENOUS | Status: AC
  Administered 2020-09-15: 500 mL via INTRAVENOUS

## 2020-09-15 MED ORDER — LABETALOL HCL 5 MG/ML IV SOLN
10.0000 mg | Freq: Once | INTRAVENOUS | Status: AC
  Administered 2020-09-15: 10 mg via INTRAVENOUS
  Filled 2020-09-15: qty 4

## 2020-09-15 NOTE — ED Provider Notes (Signed)
11:52 PM Assumed care from Dr. Tomi Bamberger, please see their note for full history, physical and decision making until this point. In brief this is a 55 y.o. year old female who presented to the ED tonight with Altered Mental Status     Acute/subacute CVA. Needs admit.   Hospitalist requesting tele-neurology evaluation to ensure no need for urgent/emergent procedure.   Neurology states normal stroke workup is appropriate. Hospitalist to admit.    Labs, studies and imaging reviewed by myself and considered in medical decision making if ordered. Imaging interpreted by radiology.  Labs Reviewed  COMPREHENSIVE METABOLIC PANEL - Abnormal; Notable for the following components:      Result Value   Glucose, Bld 124 (*)    All other components within normal limits  RESP PANEL BY RT-PCR (FLU A&B, COVID) ARPGX2  ETHANOL  PROTIME-INR  APTT  CBC  DIFFERENTIAL  RAPID URINE DRUG SCREEN, HOSP PERFORMED  URINALYSIS, ROUTINE W REFLEX MICROSCOPIC    CT HEAD WO CONTRAST  Final Result      No follow-ups on file.    Sullivan Jacuinde, Corene Cornea, MD 09/16/20 [phone]

## 2020-09-15 NOTE — ED Provider Notes (Signed)
[facility] EMERGENCY DEPARTMENT Provider Note   CSN: [id number] Arrival date & time: 09/15/20  2121     History Chief Complaint  Patient presents with  . Altered Mental Status    Erika Ruiz is a 55 y.o. female.  HPI   Patient presents to the ED for evaluation of confusion.  History was provided by the patient as well as her husband.  Patient was completely fine on Monday.  Last evening however she developed an acute headache.  Following that she started to have some periods of confusion where she was having some difficulty finishing her sentences.  She also seemed to take longer to respond.  It seemed to be a bit better and today her headache had resolved.  The confusion however has persisted.  She still has some difficulty completing sentences.  Patient has not had any fevers.  She does have history of hypertension but did not take her medications today.  She has not had any vomiting or diarrhea.  No numbness or weakness.  No trouble with her vision  Past Medical History:  Diagnosis Date  . Hyperlipemia   . Hypertension   . Uterine cancer ([place]) 1987  . Vitamin D deficiency disease     Patient Active Problem List   Diagnosis Date Noted  . Acute ischemic stroke ([place]) 09/16/2020  . Essential hypertension 09/16/2020  . Chronic back pain 09/16/2020  . GERD (gastroesophageal reflux disease) 09/16/2020  . Hyperlipidemia 09/16/2020  . Dyspepsia   . Family history of colon cancer 02/14/2017  . Abdominal pain 02/14/2017  . Abdominal pain, epigastric 07/11/2012  . Abdominal pain, left upper quadrant 07/11/2012  . Family history of malignant neoplasm of gastrointestinal tract 07/11/2012    Past Surgical History:  Procedure Laterality Date  . ABDOMINAL HYSTERECTOMY     due to CA  . BIOPSY  03/13/2017   Procedure: BIOPSY;  Surgeon: Danie Binder, MD;  Location: [facility];  Service: Endoscopy;;  gastric  . BREAST LUMPECTOMY Left   . COLONOSCOPY WITH PROPOFOL N/A  03/13/2017   Procedure: COLONOSCOPY WITH PROPOFOL;  Surgeon: Danie Binder, MD;  Location: [facility];  Service: Endoscopy;  Laterality: N/A;  11:00am  . ESOPHAGOGASTRODUODENOSCOPY (EGD) WITH PROPOFOL N/A 03/13/2017   Procedure: ESOPHAGOGASTRODUODENOSCOPY (EGD) WITH PROPOFOL;  Surgeon: Danie Binder, MD;  Location: [facility];  Service: Endoscopy;  Laterality: N/A;  . LEFT OOPHORECTOMY    . POLYPECTOMY  03/13/2017   Procedure: POLYPECTOMY;  Surgeon: Danie Binder, MD;  Location: [facility];  Service: Endoscopy;;  colon  . VAGINAL DELIVERY     X2     OB History   No obstetric history on file.     Family History  Problem Relation Age of Onset  . Ovarian cancer Paternal Aunt 70  . Colon cancer Sister 36  . Colon polyps Brother   . Diabetes Father   . Heart disease Father   . Kidney disease Father   . Breast cancer Mother     Social History   Tobacco Use  . Smoking status: Never Smoker  . Smokeless tobacco: Never Used  Vaping Use  . Vaping Use: Never used  Substance Use Topics  . Alcohol use: No    Alcohol/week: 0.0 standard drinks  . Drug use: No    Home Medications Prior to Admission medications   Medication Sig Start Date End Date Taking? Authorizing Provider  albuterol (PROVENTIL HFA;VENTOLIN HFA) 108 (90 BASE) MCG/ACT inhaler Inhale  2 puffs into the lungs every 4 (four) hours as needed for wheezing or shortness of breath. 05/11/14  Yes Ward, Cyril Mourning N, DO  fluticasone (FLONASE) 50 MCG/ACT nasal spray Place 1 spray into both nostrils daily.   Yes [provider]  ibuprofen (ADVIL,MOTRIN) 200 MG tablet Take 400 mg by mouth every 6 (six) hours as needed for headache or moderate pain.   Yes [provider]  Oxycodone HCl 20 MG TABS Take 20 mg by mouth daily as needed (for pain).  02/09/17  Yes [provider]  pantoprazole (PROTONIX) 40 MG tablet Take 1 tablet (40 mg total) by mouth daily. 30 minutes prior to breakfast Patient  taking differently: Take 40 mg by mouth daily as needed (for acid reflux). 02/14/17  Yes Annitta Needs, NP  telmisartan-hydrochlorothiazide (MICARDIS HCT) 40-12.5 MG per tablet Take 1 tablet by mouth daily.   Yes [provider]  ergocalciferol (VITAMIN D2) 1.25 MG (50000 UT) capsule Take by mouth. Patient not taking: Reported on 09/15/2020 07/02/17   [provider]    Allergies    Sulfa antibiotics  Review of Systems   Review of Systems  All other systems reviewed and are negative.   Physical Exam Updated Vital Signs BP (!) 167/77   Pulse 87   Temp 98.7 F (37.1 C) (Oral)   Resp 18   Ht 1.676 m (5\' 6" )   Wt 81.6 kg   SpO2 97%   BMI 29.05 kg/m   Physical Exam Vitals and nursing note reviewed.  Constitutional:      General: She is not in acute distress.    Appearance: She is well-developed.  HENT:     Head: Normocephalic and atraumatic.     Right Ear: External ear normal.     Left Ear: External ear normal.  Eyes:     General: No scleral icterus.       Right eye: No discharge.        Left eye: No discharge.     Conjunctiva/sclera: Conjunctivae normal.  Neck:     Trachea: No tracheal deviation.  Cardiovascular:     Rate and Rhythm: Normal rate and regular rhythm.  Pulmonary:     Effort: Pulmonary effort is normal. No respiratory distress.     Breath sounds: Normal breath sounds. No stridor. No wheezing or rales.  Abdominal:     General: Bowel sounds are normal. There is no distension.     Palpations: Abdomen is soft.     Tenderness: There is no abdominal tenderness. There is no guarding or rebound.  Musculoskeletal:        General: No tenderness.     Cervical back: Neck supple.  Skin:    General: Skin is warm and dry.     Findings: No rash.  Neurological:     Mental Status: She is alert and oriented to person, place, and time.     Cranial Nerves: No cranial nerve deficit (No facial droop, extraocular movements intact, tongue midline ).      Sensory: No sensory deficit.     Motor: No abnormal muscle tone or seizure activity.     Coordination: Coordination normal.     Comments: No pronator drift bilateral upper extrem, able to hold both legs off bed for 5 seconds, sensation intact in all extremities, no visual field cuts, no left or right sided neglect, normal finger-nose exam bilaterally, no nystagmus noted      ED Results / Procedures / Treatments  Labs (all labs ordered are listed, but only abnormal results are displayed) Labs Reviewed  COMPREHENSIVE METABOLIC PANEL - Abnormal; Notable for the following components:      Result Value   Glucose, Bld 124 (*)    All other components within normal limits  LIPID PANEL - Abnormal; Notable for the following components:   Cholesterol 259 (*)    Triglycerides 235 (*)    HDL 36 (*)    VLDL 47 (*)    LDL Cholesterol 176 (*)    All other components within normal limits  RESP PANEL BY RT-PCR (FLU A&B, COVID) ARPGX2  ETHANOL  PROTIME-INR  APTT  CBC  DIFFERENTIAL  RAPID URINE DRUG SCREEN, HOSP PERFORMED  URINALYSIS, ROUTINE W REFLEX MICROSCOPIC  HEMOGLOBIN A1C    EKG EKG Interpretation  Date/Time:  Wednesday Sep 15 2020 21:48:57 EDT Ventricular Rate:  109 PR Interval:  158 QRS Duration: 99 QT Interval:  331 QTC Calculation: 446 R Axis:   46 Text Interpretation: Sinus tachycardia Probable left atrial enlargement Low voltage, extremity and precordial leads Since last tracing rate faster Confirmed by Linwood DibblesKnapp, Sharone Picchi [phone] on 09/15/2020 9:51:11 PM   Radiology CT HEAD WO CONTRAST  Result Date: 09/15/2020 CLINICAL DATA:  Confusion, headache, last known well 2 days ago EXAM: CT HEAD WITHOUT CONTRAST TECHNIQUE: Contiguous axial images were obtained from the base of the skull through the vertex without intravenous contrast. COMPARISON:  None. FINDINGS: Brain: Hypodensities are seen within the left temporal and parietal regions consistent with acute to subacute distal left MCA  territory infarcts. Largest hypodensity in the left temporal lobe measures 2.8 cm. No mass effect. No evidence of acute hemorrhage. Focal hypodensity left basal ganglia consistent with chronic lacunar infarct. The lateral ventricles and remaining midline structures are unremarkable. No acute extra-axial fluid collections. No mass effect. Vascular: No hyperdense vessel or unexpected calcification. Skull: Normal. Negative for fracture or focal lesion. Sinuses/Orbits: No acute finding. Other: None. IMPRESSION: 1. Acute to subacute left MCA territory infarcts. No mass effect or hemorrhage. Critical Value/emergent results were called by telephone at the time of interpretation on 09/15/2020 at 11:27 pm to [facility] , who verbally acknowledged these results. Electronically Signed   By: Sharlet SalinaMichael  Brown M.D.   On: 09/15/2020 23:27   MR BRAIN WO CONTRAST  Result Date: 09/16/2020 CLINICAL DATA:  Neuro deficit, acute, stroke suspected. EXAM: MRI HEAD WITHOUT CONTRAST TECHNIQUE: Multiplanar, multiecho pulse sequences of the brain and surrounding structures were obtained without intravenous contrast. COMPARISON:  Head CT Sep 15, 2020. FINDINGS: Brain: Area of restricted diffusion involving the left temporoparietal region, consistent with acute/subacute infarct, corresponding to hypodensity seen on prior head CT. No hemorrhage, hydrocephalus, mass or midline shift. Remote infarcts within the pons, left caudate and bilateral corona radiata. Scattered foci of T2 hyperintense within the white matter of the cerebral hemispheres, nonspecific, most likely related to chronic microangiopathy. Vascular: Major arterial flow voids maintained. Skull and upper cervical spine: Normal marrow signal. Sinuses/Orbits: Negative. Other: None. IMPRESSION: 1. 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 2. Multiple small remote infarcts in the bilateral corona radiata, left  caudate and within the pons. 3. 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 Electronically Signed   By: Baldemar LenisKatyucia  De Macedo Rodrigues M.D.   On: 09/16/2020 10:37   CT CEREBRAL PERFUSION W CONTRAST  Result Date: 09/16/2020 CLINICAL DATA:  Headache and confusion EXAM: CT ANGIOGRAPHY HEAD AND NECK CT PERFUSION BRAIN TECHNIQUE:  Multidetector CT imaging of the head and neck was performed using the standard protocol during bolus administration of intravenous contrast. Multiplanar CT image reconstructions and MIPs were obtained to evaluate the vascular anatomy. Carotid stenosis measurements (when applicable) are obtained utilizing NASCET criteria, using the distal internal carotid diameter as the denominator. Multiphase CT imaging of the brain was performed following IV bolus contrast injection. Subsequent parametric perfusion maps were calculated using RAPID software. CONTRAST:  129mL OMNIPAQUE IOHEXOL 350 MG/ML SOLN COMPARISON:  None. FINDINGS: CTA NECK FINDINGS SKELETON: There is no bony spinal canal stenosis. No lytic or blastic lesion. OTHER NECK: Normal pharynx, larynx and major salivary glands. No cervical lymphadenopathy. Unremarkable thyroid gland. UPPER CHEST: No pneumothorax or pleural effusion. No nodules or masses. AORTIC ARCH: There is no calcific atherosclerosis of the aortic arch. There is no aneurysm, dissection or hemodynamically significant stenosis of the visualized portion of the aorta. Conventional 3 vessel aortic branching pattern. The visualized proximal subclavian arteries are widely patent. RIGHT CAROTID SYSTEM: Normal without aneurysm, dissection or stenosis. LEFT CAROTID SYSTEM: Normal without aneurysm, dissection or stenosis. VERTEBRAL ARTERIES: Left dominant configuration. Both origins are clearly patent. There is no dissection, occlusion or flow-limiting stenosis to the skull base (V1-V3 segments). CTA HEAD FINDINGS POSTERIOR CIRCULATION: --Vertebral arteries: Normal V4  segments. --Inferior cerebellar arteries: Normal. --Basilar artery: Normal. --Superior cerebellar arteries: Normal. --Posterior cerebral arteries (PCA): Normal. ANTERIOR CIRCULATION: --Intracranial internal carotid arteries: Normal. --Anterior cerebral arteries (ACA): Normal. Both A1 segments are present. Patent anterior communicating artery (a-comm). --Middle cerebral arteries (MCA): Short segment occlusion of the inferior division left MCA M2 segment (series 10, image 16). The more distal portion is normal. Right MCA is normal. VENOUS SINUSES: As permitted by contrast timing, patent. ANATOMIC VARIANTS: None Review of the MIP images confirms the above findings. CT Brain Perfusion Findings: ASPECTS: 7 CBF (<30%) Volume: 57mL Perfusion (Tmax>6.0s) volume: 51mL Mismatch Volume: 22mL Infarction Location:None by CT perfusion criteria. IMPRESSION: 1. Short segment occlusion of the inferior division left MCA M2 segment. No other intracranial arterial occlusion or high-grade stenosis. 2. Normal CTA of the neck. 3. Normal CT perfusion study. These results were called by telephone at the time of interpretation on 09/16/2020 at 4:01 am to provider [facility] , who verbally acknowledged these results. Electronically Signed   By: Ulyses Jarred M.D.   On: 09/16/2020 04:02   CT ANGIO HEAD CODE STROKE  Result Date: 09/16/2020 CLINICAL DATA:  Headache and confusion EXAM: CT ANGIOGRAPHY HEAD AND NECK CT PERFUSION BRAIN TECHNIQUE: Multidetector CT imaging of the head and neck was performed using the standard protocol during bolus administration of intravenous contrast. Multiplanar CT image reconstructions and MIPs were obtained to evaluate the vascular anatomy. Carotid stenosis measurements (when applicable) are obtained utilizing NASCET criteria, using the distal internal carotid diameter as the denominator. Multiphase CT imaging of the brain was performed following IV bolus contrast injection. Subsequent parametric perfusion maps  were calculated using RAPID software. CONTRAST:  155mL OMNIPAQUE IOHEXOL 350 MG/ML SOLN COMPARISON:  None. FINDINGS: CTA NECK FINDINGS SKELETON: There is no bony spinal canal stenosis. No lytic or blastic lesion. OTHER NECK: Normal pharynx, larynx and major salivary glands. No cervical lymphadenopathy. Unremarkable thyroid gland. UPPER CHEST: No pneumothorax or pleural effusion. No nodules or masses. AORTIC ARCH: There is no calcific atherosclerosis of the aortic arch. There is no aneurysm, dissection or hemodynamically significant stenosis of the visualized portion of the aorta. Conventional 3 vessel aortic branching pattern. The visualized proximal subclavian arteries are widely  patent. RIGHT CAROTID SYSTEM: Normal without aneurysm, dissection or stenosis. LEFT CAROTID SYSTEM: Normal without aneurysm, dissection or stenosis. VERTEBRAL ARTERIES: Left dominant configuration. Both origins are clearly patent. There is no dissection, occlusion or flow-limiting stenosis to the skull base (V1-V3 segments). CTA HEAD FINDINGS POSTERIOR CIRCULATION: --Vertebral arteries: Normal V4 segments. --Inferior cerebellar arteries: Normal. --Basilar artery: Normal. --Superior cerebellar arteries: Normal. --Posterior cerebral arteries (PCA): Normal. ANTERIOR CIRCULATION: --Intracranial internal carotid arteries: Normal. --Anterior cerebral arteries (ACA): Normal. Both A1 segments are present. Patent anterior communicating artery (a-comm). --Middle cerebral arteries (MCA): Short segment occlusion of the inferior division left MCA M2 segment (series 10, image 16). The more distal portion is normal. Right MCA is normal. VENOUS SINUSES: As permitted by contrast timing, patent. ANATOMIC VARIANTS: None Review of the MIP images confirms the above findings. CT Brain Perfusion Findings: ASPECTS: 7 CBF (<30%) Volume: 26mL Perfusion (Tmax>6.0s) volume: 12mL Mismatch Volume: 81mL Infarction Location:None by CT perfusion criteria. IMPRESSION: 1.  Short segment occlusion of the inferior division left MCA M2 segment. No other intracranial arterial occlusion or high-grade stenosis. 2. Normal CTA of the neck. 3. Normal CT perfusion study. These results were called by telephone at the time of interpretation on 09/16/2020 at 4:01 am to provider [facility] , who verbally acknowledged these results. Electronically Signed   By: Ulyses Jarred M.D.   On: 09/16/2020 04:02   CT ANGIO NECK CODE STROKE  Result Date: 09/16/2020 CLINICAL DATA:  Headache and confusion EXAM: CT ANGIOGRAPHY HEAD AND NECK CT PERFUSION BRAIN TECHNIQUE: Multidetector CT imaging of the head and neck was performed using the standard protocol during bolus administration of intravenous contrast. Multiplanar CT image reconstructions and MIPs were obtained to evaluate the vascular anatomy. Carotid stenosis measurements (when applicable) are obtained utilizing NASCET criteria, using the distal internal carotid diameter as the denominator. Multiphase CT imaging of the brain was performed following IV bolus contrast injection. Subsequent parametric perfusion maps were calculated using RAPID software. CONTRAST:  128mL OMNIPAQUE IOHEXOL 350 MG/ML SOLN COMPARISON:  None. FINDINGS: CTA NECK FINDINGS SKELETON: There is no bony spinal canal stenosis. No lytic or blastic lesion. OTHER NECK: Normal pharynx, larynx and major salivary glands. No cervical lymphadenopathy. Unremarkable thyroid gland. UPPER CHEST: No pneumothorax or pleural effusion. No nodules or masses. AORTIC ARCH: There is no calcific atherosclerosis of the aortic arch. There is no aneurysm, dissection or hemodynamically significant stenosis of the visualized portion of the aorta. Conventional 3 vessel aortic branching pattern. The visualized proximal subclavian arteries are widely patent. RIGHT CAROTID SYSTEM: Normal without aneurysm, dissection or stenosis. LEFT CAROTID SYSTEM: Normal without aneurysm, dissection or stenosis. VERTEBRAL  ARTERIES: Left dominant configuration. Both origins are clearly patent. There is no dissection, occlusion or flow-limiting stenosis to the skull base (V1-V3 segments). CTA HEAD FINDINGS POSTERIOR CIRCULATION: --Vertebral arteries: Normal V4 segments. --Inferior cerebellar arteries: Normal. --Basilar artery: Normal. --Superior cerebellar arteries: Normal. --Posterior cerebral arteries (PCA): Normal. ANTERIOR CIRCULATION: --Intracranial internal carotid arteries: Normal. --Anterior cerebral arteries (ACA): Normal. Both A1 segments are present. Patent anterior communicating artery (a-comm). --Middle cerebral arteries (MCA): Short segment occlusion of the inferior division left MCA M2 segment (series 10, image 16). The more distal portion is normal. Right MCA is normal. VENOUS SINUSES: As permitted by contrast timing, patent. ANATOMIC VARIANTS: None Review of the MIP images confirms the above findings. CT Brain Perfusion Findings: ASPECTS: 7 CBF (<30%) Volume: 71mL Perfusion (Tmax>6.0s) volume: 8mL Mismatch Volume: 42mL Infarction Location:None by CT perfusion criteria. IMPRESSION: 1. Short segment occlusion  of the inferior division left MCA M2 segment. No other intracranial arterial occlusion or high-grade stenosis. 2. Normal CTA of the neck. 3. Normal CT perfusion study. These results were called by telephone at the time of interpretation on 09/16/2020 at 4:01 am to provider [facility] , who verbally acknowledged these results. Electronically Signed   By: Ulyses Jarred M.D.   On: 09/16/2020 04:02    Procedures .Critical Care Performed by: Dorie Rank, MD Authorized by: Dorie Rank, MD   Critical care provider statement:    Critical care time (minutes):  45   Critical care was time spent personally by me on the following activities:  Discussions with consultants, evaluation of patient's response to treatment, examination of patient, ordering and performing treatments and interventions, ordering and review of  laboratory studies, ordering and review of radiographic studies, pulse oximetry, re-evaluation of patient's condition, obtaining history from patient or surrogate and review of old charts     Medications Ordered in ED Medications  sodium chloride 0.9 % bolus 500 mL (0 mLs Intravenous Stopped 09/16/20 0245)    Followed by  0.9 %  sodium chloride infusion (100 mL/hr Intravenous New Bag/Given 09/15/20 2254)  labetalol (NORMODYNE) injection 10 mg (has no administration in time range)  aspirin EC tablet 81 mg (has no administration in time range)  clopidogrel (PLAVIX) tablet 75 mg (has no administration in time range)  labetalol (NORMODYNE) injection 10 mg (10 mg Intravenous Given 09/15/20 2255)  iohexol (OMNIPAQUE) 350 MG/ML injection 150 mL (100 mLs Intravenous Contrast Given 09/16/20 0331)    ED Course  I have reviewed the triage vital signs and the nursing notes.  Pertinent labs & imaging results that were available during my care of the patient were reviewed by me and considered in my medical decision making (see chart for details).  Clinical Course as of 09/16/20 1100  Wed Sep 15, 2020  2205 Patient is significantly hypertensive.  Symptoms concerning for the possibility of cerebral hemorrhage or stroke. [id number] With her degree of hypertension we will give 1 dose of labetalol.  We will hold off on aggressive blood pressure management until we get her head CT  [JK]    Clinical Course User Index [JK] Dorie Rank, MD   MDM Rules/Calculators/A&P                          Pt presented with complaints of difficulty with speech.  No clear aphasia on exam.  Neuro exam otherwise normal.  Sx concerning for stroke, cerebral hemorrhage.  Pt notably htn.   CT scan did confirm stroke.  No aggressive blood pressure management due to ischemic stroke.  Pt outside TPA window.  No indication for LVO intervention, sx severity mild.  Admit for further evaluation. Final Clinical Impression(s) / ED  Diagnoses Final diagnoses:  Cerebrovascular accident (CVA), unspecified mechanism ([place])      Dorie Rank, MD 09/16/20 1100

## 2020-09-15 NOTE — ED Triage Notes (Signed)
Pt to er with husband, per husband pt was last normal Monday, states that she had a headache.  States that last night pt started having some confusion, states that she is having a hard time answering questions and is slow to answer questions, pt appears lost, pt is slow to answer questions.  Pt oriented to person, place and self.

## 2020-09-16 ENCOUNTER — Observation Stay (HOSPITAL_COMMUNITY): Payer: Commercial Managed Care - PPO

## 2020-09-16 ENCOUNTER — Encounter (HOSPITAL_COMMUNITY): Payer: Self-pay | Admitting: Internal Medicine

## 2020-09-16 ENCOUNTER — Inpatient Hospital Stay (HOSPITAL_COMMUNITY): Payer: Commercial Managed Care - PPO

## 2020-09-16 ENCOUNTER — Emergency Department (HOSPITAL_COMMUNITY): Payer: Commercial Managed Care - PPO

## 2020-09-16 DIAGNOSIS — Z20822 Contact with and (suspected) exposure to covid-19: Secondary | ICD-10-CM | POA: Diagnosis present

## 2020-09-16 DIAGNOSIS — Z8371 Family history of colonic polyps: Secondary | ICD-10-CM | POA: Diagnosis not present

## 2020-09-16 DIAGNOSIS — Z800 Family history of malignant neoplasm of digestive organs: Secondary | ICD-10-CM | POA: Diagnosis not present

## 2020-09-16 DIAGNOSIS — I63512 Cerebral infarction due to unspecified occlusion or stenosis of left middle cerebral artery: Secondary | ICD-10-CM | POA: Diagnosis present

## 2020-09-16 DIAGNOSIS — Z66 Do not resuscitate: Secondary | ICD-10-CM | POA: Diagnosis present

## 2020-09-16 DIAGNOSIS — Z8673 Personal history of transient ischemic attack (TIA), and cerebral infarction without residual deficits: Secondary | ICD-10-CM | POA: Diagnosis not present

## 2020-09-16 DIAGNOSIS — K219 Gastro-esophageal reflux disease without esophagitis: Secondary | ICD-10-CM | POA: Diagnosis present

## 2020-09-16 DIAGNOSIS — I639 Cerebral infarction, unspecified: Secondary | ICD-10-CM | POA: Diagnosis not present

## 2020-09-16 DIAGNOSIS — Z79899 Other long term (current) drug therapy: Secondary | ICD-10-CM | POA: Diagnosis not present

## 2020-09-16 DIAGNOSIS — I10 Essential (primary) hypertension: Secondary | ICD-10-CM | POA: Diagnosis present

## 2020-09-16 DIAGNOSIS — G8929 Other chronic pain: Secondary | ICD-10-CM | POA: Diagnosis present

## 2020-09-16 DIAGNOSIS — R4182 Altered mental status, unspecified: Secondary | ICD-10-CM | POA: Diagnosis present

## 2020-09-16 DIAGNOSIS — G9389 Other specified disorders of brain: Secondary | ICD-10-CM | POA: Diagnosis present

## 2020-09-16 DIAGNOSIS — I6389 Other cerebral infarction: Secondary | ICD-10-CM

## 2020-09-16 DIAGNOSIS — Z8542 Personal history of malignant neoplasm of other parts of uterus: Secondary | ICD-10-CM | POA: Diagnosis not present

## 2020-09-16 DIAGNOSIS — E559 Vitamin D deficiency, unspecified: Secondary | ICD-10-CM | POA: Diagnosis present

## 2020-09-16 DIAGNOSIS — Z90710 Acquired absence of both cervix and uterus: Secondary | ICD-10-CM | POA: Diagnosis not present

## 2020-09-16 DIAGNOSIS — Z803 Family history of malignant neoplasm of breast: Secondary | ICD-10-CM | POA: Diagnosis not present

## 2020-09-16 DIAGNOSIS — E785 Hyperlipidemia, unspecified: Secondary | ICD-10-CM

## 2020-09-16 DIAGNOSIS — Z882 Allergy status to sulfonamides status: Secondary | ICD-10-CM | POA: Diagnosis not present

## 2020-09-16 DIAGNOSIS — Z841 Family history of disorders of kidney and ureter: Secondary | ICD-10-CM | POA: Diagnosis not present

## 2020-09-16 DIAGNOSIS — Z8041 Family history of malignant neoplasm of ovary: Secondary | ICD-10-CM | POA: Diagnosis not present

## 2020-09-16 DIAGNOSIS — Z8249 Family history of ischemic heart disease and other diseases of the circulatory system: Secondary | ICD-10-CM | POA: Diagnosis not present

## 2020-09-16 DIAGNOSIS — E782 Mixed hyperlipidemia: Secondary | ICD-10-CM | POA: Diagnosis present

## 2020-09-16 DIAGNOSIS — Z833 Family history of diabetes mellitus: Secondary | ICD-10-CM | POA: Diagnosis not present

## 2020-09-16 LAB — HEMOGLOBIN A1C
Hgb A1c MFr Bld: 5.7 % — ABNORMAL HIGH (ref 4.8–5.6)
Mean Plasma Glucose: 116.89 mg/dL

## 2020-09-16 LAB — LIPID PANEL
Cholesterol: 259 mg/dL — ABNORMAL HIGH (ref 0–200)
HDL: 36 mg/dL — ABNORMAL LOW (ref >40)
LDL Cholesterol: 176 mg/dL — ABNORMAL HIGH (ref 0–99)
Total CHOL/HDL Ratio: 7.2 RATIO
Triglycerides: 235 mg/dL — ABNORMAL HIGH (ref <150)
VLDL: 47 mg/dL — ABNORMAL HIGH (ref 0–40)

## 2020-09-16 LAB — ECHOCARDIOGRAM COMPLETE
Area-P 1/2: 3.85 cm2
Height: 66 in
S' Lateral: 2.7 cm
Weight: 2880 oz

## 2020-09-16 LAB — RESP PANEL BY RT-PCR (FLU A&B, COVID) ARPGX2
Influenza A by PCR: NEGATIVE
Influenza B by PCR: NEGATIVE
SARS Coronavirus 2 by RT PCR: NEGATIVE

## 2020-09-16 MED ORDER — ACETAMINOPHEN 650 MG RE SUPP: 650.0000 mg | RECTAL | Status: DC | PRN

## 2020-09-16 MED ORDER — PANTOPRAZOLE SODIUM 40 MG PO TBEC
40.0000 mg | DELAYED_RELEASE_TABLET | Freq: Every day | ORAL | Status: DC
  Administered 2020-09-16 – 2020-09-17 (×2): 40 mg via ORAL
  Filled 2020-09-16 (×2): qty 1

## 2020-09-16 MED ORDER — ASPIRIN EC 81 MG PO TBEC
81.0000 mg | DELAYED_RELEASE_TABLET | Freq: Every day | ORAL | Status: DC
  Administered 2020-09-16 – 2020-09-17 (×2): 81 mg via ORAL
  Filled 2020-09-16 (×2): qty 1

## 2020-09-16 MED ORDER — ACETAMINOPHEN 325 MG PO TABS
650.0000 mg | ORAL_TABLET | ORAL | Status: DC | PRN
  Administered 2020-09-16: 650 mg via ORAL
  Filled 2020-09-16: qty 2

## 2020-09-16 MED ORDER — CLOPIDOGREL BISULFATE 75 MG PO TABS: 75.0000 mg | ORAL_TABLET | Freq: Every day | ORAL | Status: DC

## 2020-09-16 MED ORDER — CLOPIDOGREL BISULFATE 75 MG PO TABS
75.0000 mg | ORAL_TABLET | Freq: Every day | ORAL | Status: DC
  Administered 2020-09-16 – 2020-09-17 (×2): 75 mg via ORAL
  Filled 2020-09-16 (×2): qty 1

## 2020-09-16 MED ORDER — LABETALOL HCL 5 MG/ML IV SOLN: 10.0000 mg | INTRAVENOUS | Status: DC | PRN

## 2020-09-16 MED ORDER — IOHEXOL 350 MG/ML SOLN
150.0000 mL | Freq: Once | INTRAVENOUS | Status: AC | PRN
  Administered 2020-09-16: 100 mL via INTRAVENOUS

## 2020-09-16 MED ORDER — SODIUM CHLORIDE 0.9 % IV SOLN: INTRAVENOUS | Status: DC

## 2020-09-16 MED ORDER — ACETAMINOPHEN 160 MG/5ML PO SOLN: 650.0000 mg | ORAL | Status: DC | PRN

## 2020-09-16 MED ORDER — SENNOSIDES-DOCUSATE SODIUM 8.6-50 MG PO TABS: 1.0000 | ORAL_TABLET | Freq: Every evening | ORAL | Status: DC | PRN

## 2020-09-16 MED ORDER — STROKE: EARLY STAGES OF RECOVERY BOOK: Freq: Once | Status: AC

## 2020-09-16 MED ORDER — FLUTICASONE PROPIONATE 50 MCG/ACT NA SUSP: 1.0000 | Freq: Every day | NASAL | Status: DC

## 2020-09-16 MED ORDER — ASPIRIN EC 81 MG PO TBEC: 81.0000 mg | DELAYED_RELEASE_TABLET | Freq: Every day | ORAL | Status: DC

## 2020-09-16 MED ORDER — ENOXAPARIN SODIUM 40 MG/0.4ML IJ SOSY
40.0000 mg | PREFILLED_SYRINGE | INTRAMUSCULAR | Status: DC
  Administered 2020-09-16: 40 mg via SUBCUTANEOUS
  Filled 2020-09-16: qty 0.4

## 2020-09-16 NOTE — Evaluation (Signed)
Occupational Therapy Evaluation Patient Details Name: Erika Ruiz MRN: [id number] DOB: 12/24/1965 Today's Date: 09/16/2020    History of Present Illness HPI: Erika Ruiz is a 55 y.o. female with medical history significant for GERD, hypertension, hyperlipidemia, chronic back pain who presents to the Emergency department accompanied by husband due to difficulty in speech where by she pauses in between speeches due to word finding difficulty.  Patient was last well-known on Monday (09/13/2020) and symptoms started with complaint of left-sided frontal headache which was persistent for about a day.  Most of the history was obtained from the ED physician and husband at bedside, per husband, he noted that patient was having difficulty in finishing her sentences.  It was thought that this was due to stress, so, patient rested on Tuesday (5/3), but symptoms persisted till Wednesday (5/4) and it was decided for patient to go to the ED for further evaluation and management.  Patient denies fever, chills, chest pain, shortness of breath, nausea, vomiting or abdominal pain.   Clinical Impression   Pt agreeable to OT/PT co-evaluation this date. Pt presents with mild flat affect but followed commands well. Pt demonstrated independence with functional ambulation and bed mobility. Pt appears to be operating at baseline levels for ADL's. Pt did report she still feels "foggy," but has not experienced andy change in vision or lack of sensation to B UE. Pt is not recommended for further acute OT services and will be discharged to the care of nursing staff for the remainder of her stay.    Follow Up Recommendations  No OT follow up    Equipment Recommendations  None recommended by OT           Precautions / Restrictions Precautions Precautions: None Restrictions Weight Bearing Restrictions: No      Mobility Bed Mobility Overal bed mobility: Independent                  Transfers Overall  transfer level: Independent Equipment used: None             General transfer comment: Independent functional ambulation around ED and back to bed.    Balance Overall balance assessment: Independent                                         ADL either performed or assessed with clinical judgement   ADL Overall ADL's : Independent                                       General ADL Comments: donned shoes seated at EOB with independence.     Vision Baseline Vision/History: No visual deficits Patient Visual Report: No change from baseline Vision Assessment?: No apparent visual deficits                Pertinent Vitals/Pain Pain Assessment: No/denies pain     Hand Dominance Right   Extremity/Trunk Assessment Upper Extremity Assessment Upper Extremity Assessment: Overall WFL for tasks assessed   Lower Extremity Assessment Lower Extremity Assessment: Defer to PT evaluation   Cervical / Trunk Assessment Cervical / Trunk Assessment: Normal   Communication Communication Communication: No difficulties   Cognition Arousal/Alertness: Awake/alert Behavior During Therapy: WFL for tasks assessed/performed Overall Cognitive Status: Within Functional Limits for tasks assessed  General Comments: mild flat affect                    Home Living Family/patient expects to be discharged to:: Private residence Living Arrangements: Spouse/significant other Available Help at Discharge: Available 24 hours/day Type of Home: House Home Access: Level entry     Home Layout: One level     Bathroom Shower/Tub: Teacher, early years/pre: Standard Bathroom Accessibility: Yes How Accessible: Accessible via wheelchair;Accessible via walker Home Equipment: None          Prior Functioning/Environment Level of Independence: Independent        Comments: Pt drove; Indpendent ADL and IADL                       OT Goals(Current goals can be found in the care plan section) Acute Rehab OT Goals Patient Stated Goal: return home                   Co-evaluation PT/OT/SLP Co-Evaluation/Treatment: Yes Reason for Co-Treatment: To address functional/ADL transfers   OT goals addressed during session: ADL's and self-care;Strengthening/ROM      AM-PAC OT "6 Clicks" Daily Activity     Outcome Measure Help from another person eating meals?: None Help from another person taking care of personal grooming?: None Help from another person toileting, which includes using toliet, bedpan, or urinal?: None Help from another person bathing (including washing, rinsing, drying)?: None Help from another person to put on and taking off regular upper body clothing?: None Help from another person to put on and taking off regular lower body clothing?: None 6 Click Score: 24   End of Session    Activity Tolerance: Patient tolerated treatment well Patient left: in bed;with call bell/phone within reach  OT Visit Diagnosis: Other symptoms and signs involving the nervous system (O96.295)                Time: 2841-3244 OT Time Calculation (min): 8 min Charges:  OT General Charges $OT Visit: 1 Visit OT Evaluation $OT Eval Low Complexity: 1 Low  Jquan Egelston OT, MOT   Larey Seat 09/16/2020, 9:40 AM

## 2020-09-16 NOTE — Progress Notes (Signed)
*  PRELIMINARY RESULTS* Echocardiogram 2D Echocardiogram has been performed.  Erika Ruiz 09/16/2020, 2:16 PM

## 2020-09-16 NOTE — Consult Note (Signed)
[facility] Tele[facility]urology Consult Services  Stat Consult  Date of Service:   09/16/2020 00:39:07  Diagnosis:     .  I63.9 - Cerebrovascular accident (CVA), unspecified mechanism ([place])  Impression: 55 year old female with a past medical history of HTN presents with symptoms of aphasia that was noticed by the husband yesterday. Husband states that she has been having a hard time completing her sentences. Last known well time was on Monday. 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 Recommend DAPT therapy, admit for stroke work up, MRA head and neck and TTE.  Our recommendations are outlined below.  Laboratory Studies: Recommend Lipid panel Hemoglobin A1c  Nursing Recommendations: Telemetry, IV Fluids, avoid dextrose containing fluids, Maintain euglycemia Neuro checks q4 hrs x 24 hrs and then per shift Head of bed 30 degrees  Consultations: Recommend Speech therapy if failed dysphagia screen Physical therapy/Occupational therapy   Imaging: IMPRESSION: 1. Acute to subacute left MCA territory infarcts. No mass effect or hemorrhage.  Metrics: [facility] Notification Time: 09/16/2020 00:37:31 Stamp Time: 09/16/2020 00:39:07 Callback Response Time: 09/16/2020 00:39:34   ----------------------------------------------------------------------------------------------------  Chief Complaint: Aphasia  History of Present Illness: Patient is a 55 year old Female.  55 year old female with a past medical history of HTN presents with symptoms of aphasia that was noticed by the husband yesterday. Husband states that she has been having a hard time completing her sentences. Last known well time was on Monday. 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    Past Medical History:     . Hypertension     . There is NO history of Diabetes Mellitus     . There is NO history of Hyperlipidemia     . There is NO history of Coronary Artery  Disease     . There is NO history of Stroke  Anticoagulant use:  No  Antiplatelet use: No    Examination: BP(143/84), Pulse(85), Blood Glucose(124) 1A: Level of Consciousness - Alert; keenly responsive + 0 1B: Ask Month and Age - Both Questions Right + 0 1C: Blink Eyes & Squeeze Hands - Performs Both Tasks + 0 2: Test Horizontal Extraocular Movements - Normal + 0 3: Test Visual Fields - No Visual Loss + 0 4: Test Facial Palsy (Use Grimace if Obtunded) - Normal symmetry + 0 5A: Test Left Arm Motor Drift - No Drift for 10 Seconds + 0 5B: Test Right Arm Motor Drift - No Drift for 10 Seconds + 0 6A: Test Left Leg Motor Drift - No Drift for 5 Seconds + 0 6B: Test Right Leg Motor Drift - No Drift for 5 Seconds + 0 7: Test Limb Ataxia (FNF/Heel-Shin) - No Ataxia + 0 8: Test Sensation - Normal; No sensory loss + 0 9: Test Language/Aphasia - Mild-Moderate Aphasia: Some Obvious Changes, Without Significant Limitation + 1 10: Test Dysarthria - Normal + 0 11: Test Extinction/Inattention - No abnormality + 0  NIHSS Score: 1     Patient / Family was informed the Neurology Consult would occur via TeleHealth consult by way of interactive audio and video telecommunications and consented to receiving care in this manner.  Patient is being evaluated for possible acute neurologic impairment and high probability of imminent or life - threatening deterioration.I spent total of 30 minutes providing care to this patient, including time for face to face visit via telemedicine, review of medical records, imaging studies and discussion of findings with providers, the patient and / or family.  Dr Jessica Priest   [facility] [phone]  Case [id number]

## 2020-09-16 NOTE — Plan of Care (Signed)
  Problem: Education: Goal: Knowledge of disease or condition will improve Outcome: Progressing Goal: Knowledge of secondary prevention will improve Outcome: Progressing Goal: Knowledge of patient specific risk factors addressed and post discharge goals established will improve Outcome: Progressing   Problem: Coping: Goal: Will verbalize positive feelings about self Outcome: Progressing   

## 2020-09-16 NOTE — Progress Notes (Signed)
PROGRESS NOTE  Erika Ruiz YCX:[id number] DOB: 1966/05/08 DOA: 09/15/2020 PCP: [name], DO  Brief History:  55 year old female with a history of hypertension, hyperlipidemia, chronic back pain, and GERD presenting with word finding difficulty.  The patient was last known normal on 09/13/2020.  Her symptoms began with a left-sided frontal headache on 5-22.  This persisted into 09/14/2020.  Her spouse noted the patient to be "confused" on 09/14/2020 with difficulty finishing her sentences.  The patient had some word finding difficulty which persisted into 09/15/2020.  Her headache improved, but she continued to have slow responses to questions.  As result, the patient was brought to emergency department for further evaluation.  The patient herself denies any fevers, chills, neck pain, chest pain, shortness of breath, coughing, hemoptysis, nausea, vomiting, direct abdominal pain, dysuria, hematuria. In the emergency department, the patient was afebrile and hemodynamically stable with blood pressure 203/123.  Oxygen saturation was 97%-100% on room air.  CT of the brain showed an acute to subacute left MCA infarct.  Teleneurology was consulted and recommended admission for further work-up.   Assessment/Plan: Acute/subacute left MCA infarct -Appreciate Neurology Consult -PT/OT evaluation -Speech therapy eval -CT brain--acute to subacute left MCA infarct -MRI brain-- -MRA brain-- -CTA H&N--normal CTA head and neck without any hemodynamically significant stenosis; short segment inferior left MCA M2 stenosis -CT brain perfusion normal -Echo-- -LDL-- -HbA1C-- -Antiplatelet--  Essential hypertension -Holding telmisartan/HCTZ to allow for permissive hypertension -Hydralazine prn SBP>220  Chronic back pain -Continue home dose Oxycodone -PMDP reviewed--pt receives oxycodone 15 mg #90 monthly   Active Problems:   CVA (cerebral vascular accident) ([place])     Status is:  Observation  The patient will require care spanning > 2 midnights and should be moved to inpatient because: Ongoing diagnostic testing needed not appropriate for outpatient work up  Dispo: The patient is from: Home              Anticipated d/c is to: Home              Patient currently is not medically stable to d/c.   Difficult to place patient No        Family Communication:   No Family at bedside  Consultants: neurology   Code Status:  FULL / DNR  DVT Prophylaxis:  [place] Lovenox   Procedures: As Listed in Progress Note Above  Antibiotics: None       Subjective: Patient continues to have word finding difficulty.  She denies any fevers, chills, headache, neck pain, chest pain, chronic breath, coughing, hemoptysis, nausea, vomiting, diarrhea, domino pain, dysuria.  Objective: Vitals:   09/16/20 0344 09/16/20 0345 09/16/20 0400 09/16/20 0500  BP:  (!) 165/96 (!) 165/93 137/72  Pulse: 95 92 93 81  Resp: 15 14 14 12   Temp:      TempSrc:      SpO2: 98% 97% 96% 96%  Weight:      Height:       No intake or output data in the 24 hours ending 09/16/20 0657 Weight change:  Exam:   General:  Pt is alert, follows commands appropriately, not in acute distress  HEENT: No icterus, No thrush, No neck mass, [place]/AT  Cardiovascular: RRR, S1/S2, no rubs, no gallops  Respiratory: CTA bilaterally, no wheezing, no crackles, no rhonchi  Abdomen: Soft/+BS, non tender, non distended, no guarding  Extremities: No edema, No lymphangitis, No petechiae, No rashes, no synovitis  Neuro:  CN II-XII intact, strength 4/5 in RUE, RLE, strength 4/5 LUE, LLE; sensation intact bilateral; no dysmetria; babinski equivocal     Data Reviewed: I have personally reviewed following labs and imaging studies Basic Metabolic Panel: Recent Labs  Lab 09/15/20 2232  NA 137  K 4.1  CL 101  CO2 29  GLUCOSE 124*  BUN 13  CREATININE 0.83  CALCIUM 9.3   Liver Function Tests: Recent Labs   Lab 09/15/20 2232  AST 19  ALT 30  ALKPHOS 109  BILITOT 0.6  PROT 7.8  ALBUMIN 4.2   No results for input(s): LIPASE, AMYLASE in the last 168 hours. No results for input(s): AMMONIA in the last 168 hours. Coagulation Profile: Recent Labs  Lab 09/15/20 2232  INR 1.0   CBC: Recent Labs  Lab 09/15/20 2232  WBC 8.4  NEUTROABS 4.8  HGB 14.5  HCT 43.1  MCV 90.7  PLT 318   Cardiac Enzymes: No results for input(s): CKTOTAL, CKMB, CKMBINDEX, TROPONINI in the last 168 hours. BNP: Invalid input(s): POCBNP CBG: No results for input(s): GLUCAP in the last 168 hours. HbA1C: No results for input(s): HGBA1C in the last 72 hours. Urine analysis:    Component Value Date/Time   COLORURINE YELLOW 09/15/2020 2256   APPEARANCEUR CLEAR 09/15/2020 2256   LABSPEC 1.016 09/15/2020 2256   PHURINE 7.0 09/15/2020 2256   GLUCOSEU NEGATIVE 09/15/2020 2256   HGBUR NEGATIVE 09/15/2020 2256   BILIRUBINUR NEGATIVE 09/15/2020 2256   KETONESUR NEGATIVE 09/15/2020 2256   PROTEINUR NEGATIVE 09/15/2020 2256   UROBILINOGEN 2.0 (H) 10/27/2013 2024   NITRITE NEGATIVE 09/15/2020 2256   LEUKOCYTESUR NEGATIVE 09/15/2020 2256   Sepsis Labs: @LABRCNTIP (procalcitonin:4,lacticidven:4) ) Recent Results (from the past 240 hour(s))  Resp Panel by RT-PCR (Flu A&B, Covid) Nasopharyngeal Swab     Status: None   Collection Time: 09/16/20 12:21 AM   Specimen: Nasopharyngeal Swab; Nasopharyngeal(NP) swabs in vial transport medium  Result Value Ref Range Status   SARS Coronavirus 2 by RT PCR NEGATIVE NEGATIVE Final    Comment: (NOTE) SARS-CoV-2 target nucleic acids are NOT DETECTED.  The SARS-CoV-2 RNA is generally detectable in upper respiratory specimens during the acute phase of infection. The lowest concentration of SARS-CoV-2 viral copies this assay can detect is 138 copies/mL. A negative result does not preclude SARS-Cov-2 infection and should not be used as the sole basis for treatment or other  patient management decisions. A negative result may occur with  improper specimen collection/handling, submission of specimen other than nasopharyngeal swab, presence of viral mutation(s) within the areas targeted by this assay, and inadequate number of viral copies(<138 copies/mL). A negative result must be combined with clinical observations, patient history, and epidemiological information. The expected result is Negative.  Fact Sheet for Patients:  EntrepreneurPulse.com.au  Fact Sheet for Healthcare Providers:  IncredibleEmployment.be  This test is no t yet approved or cleared by the [place] FDA and  has been authorized for detection and/or diagnosis of SARS-CoV-2 by FDA under an Emergency Use Authorization (EUA). This EUA will remain  in effect (meaning this test can be used) for the duration of the COVID-19 declaration under Section 564(b)(1) of the Act, 21 U.S.C.section 360bbb-3(b)(1), unless the authorization is terminated  or revoked sooner.       Influenza A by PCR NEGATIVE NEGATIVE Final   Influenza B by PCR NEGATIVE NEGATIVE Final    Comment: (NOTE) The Xpert Xpress SARS-CoV-2/FLU/RSV plus assay is intended as an aid in the diagnosis of influenza  from Nasopharyngeal swab specimens and should not be used as a sole basis for treatment. Nasal washings and aspirates are unacceptable for Xpert Xpress SARS-CoV-2/FLU/RSV testing.  Fact Sheet for Patients: EntrepreneurPulse.com.au  Fact Sheet for Healthcare Providers: IncredibleEmployment.be  This test is not yet approved or cleared by the [place] FDA and has been authorized for detection and/or diagnosis of SARS-CoV-2 by FDA under an Emergency Use Authorization (EUA). This EUA will remain in effect (meaning this test can be used) for the duration of the COVID-19 declaration under Section 564(b)(1) of the Act, 21 U.S.C. section  360bbb-3(b)(1), unless the authorization is terminated or revoked.  Performed at [facility], [address] [place], [place] [address]      Scheduled Meds: Continuous Infusions: . sodium chloride 100 mL/hr (09/15/20 2254)    Procedures/Studies: CT HEAD WO CONTRAST  Result Date: 09/15/2020 CLINICAL DATA:  Confusion, headache, last known well 2 days ago EXAM: CT HEAD WITHOUT CONTRAST TECHNIQUE: Contiguous axial images were obtained from the base of the skull through the vertex without intravenous contrast. COMPARISON:  None. FINDINGS: Brain: Hypodensities are seen within the left temporal and parietal regions consistent with acute to subacute distal left MCA territory infarcts. Largest hypodensity in the left temporal lobe measures 2.8 cm. No mass effect. No evidence of acute hemorrhage. Focal hypodensity left basal ganglia consistent with chronic lacunar infarct. The lateral ventricles and remaining midline structures are unremarkable. No acute extra-axial fluid collections. No mass effect. Vascular: No hyperdense vessel or unexpected calcification. Skull: Normal. Negative for fracture or focal lesion. Sinuses/Orbits: No acute finding. Other: None. IMPRESSION: 1. Acute to subacute left MCA territory infarcts. No mass effect or hemorrhage. Critical Value/emergent results were called by telephone at the time of interpretation on 09/15/2020 at 11:27 pm to provider [facility] , who verbally acknowledged these results. Electronically Signed   By: Randa Ngo M.D.   On: 09/15/2020 23:27   CT CEREBRAL PERFUSION W CONTRAST  Result Date: 09/16/2020 CLINICAL DATA:  Headache and confusion EXAM: CT ANGIOGRAPHY HEAD AND NECK CT PERFUSION BRAIN TECHNIQUE: Multidetector CT imaging of the head and neck was performed using the standard protocol during bolus administration of intravenous contrast. Multiplanar CT image reconstructions and MIPs were obtained to evaluate the vascular anatomy. Carotid stenosis  measurements (when applicable) are obtained utilizing NASCET criteria, using the distal internal carotid diameter as the denominator. Multiphase CT imaging of the brain was performed following IV bolus contrast injection. Subsequent parametric perfusion maps were calculated using RAPID software. CONTRAST:  163mL OMNIPAQUE IOHEXOL 350 MG/ML SOLN COMPARISON:  None. FINDINGS: CTA NECK FINDINGS SKELETON: There is no bony spinal canal stenosis. No lytic or blastic lesion. OTHER NECK: Normal pharynx, larynx and major salivary glands. No cervical lymphadenopathy. Unremarkable thyroid gland. UPPER CHEST: No pneumothorax or pleural effusion. No nodules or masses. AORTIC ARCH: There is no calcific atherosclerosis of the aortic arch. There is no aneurysm, dissection or hemodynamically significant stenosis of the visualized portion of the aorta. Conventional 3 vessel aortic branching pattern. The visualized proximal subclavian arteries are widely patent. RIGHT CAROTID SYSTEM: Normal without aneurysm, dissection or stenosis. LEFT CAROTID SYSTEM: Normal without aneurysm, dissection or stenosis. VERTEBRAL ARTERIES: Left dominant configuration. Both origins are clearly patent. There is no dissection, occlusion or flow-limiting stenosis to the skull base (V1-V3 segments). CTA HEAD FINDINGS POSTERIOR CIRCULATION: --Vertebral arteries: Normal V4 segments. --Inferior cerebellar arteries: Normal. --Basilar artery: Normal. --Superior cerebellar arteries: Normal. --Posterior cerebral arteries (PCA): Normal. ANTERIOR CIRCULATION: --Intracranial internal carotid arteries: Normal. --  Anterior cerebral arteries (ACA): Normal. Both A1 segments are present. Patent anterior communicating artery (a-comm). --Middle cerebral arteries (MCA): Short segment occlusion of the inferior division left MCA M2 segment (series 10, image 16). The more distal portion is normal. Right MCA is normal. VENOUS SINUSES: As permitted by contrast timing, patent.  ANATOMIC VARIANTS: None Review of the MIP images confirms the above findings. CT Brain Perfusion Findings: ASPECTS: 7 CBF (<30%) Volume: 27mL Perfusion (Tmax>6.0s) volume: 34mL Mismatch Volume: 70mL Infarction Location:None by CT perfusion criteria. IMPRESSION: 1. Short segment occlusion of the inferior division left MCA M2 segment. No other intracranial arterial occlusion or high-grade stenosis. 2. Normal CTA of the neck. 3. Normal CT perfusion study. These results were called by telephone at the time of interpretation on 09/16/2020 at 4:01 am to provider [facility] , who verbally acknowledged these results. Electronically Signed   By: Ulyses Jarred M.D.   On: 09/16/2020 04:02   CT ANGIO HEAD CODE STROKE  Result Date: 09/16/2020 CLINICAL DATA:  Headache and confusion EXAM: CT ANGIOGRAPHY HEAD AND NECK CT PERFUSION BRAIN TECHNIQUE: Multidetector CT imaging of the head and neck was performed using the standard protocol during bolus administration of intravenous contrast. Multiplanar CT image reconstructions and MIPs were obtained to evaluate the vascular anatomy. Carotid stenosis measurements (when applicable) are obtained utilizing NASCET criteria, using the distal internal carotid diameter as the denominator. Multiphase CT imaging of the brain was performed following IV bolus contrast injection. Subsequent parametric perfusion maps were calculated using RAPID software. CONTRAST:  149mL OMNIPAQUE IOHEXOL 350 MG/ML SOLN COMPARISON:  None. FINDINGS: CTA NECK FINDINGS SKELETON: There is no bony spinal canal stenosis. No lytic or blastic lesion. OTHER NECK: Normal pharynx, larynx and major salivary glands. No cervical lymphadenopathy. Unremarkable thyroid gland. UPPER CHEST: No pneumothorax or pleural effusion. No nodules or masses. AORTIC ARCH: There is no calcific atherosclerosis of the aortic arch. There is no aneurysm, dissection or hemodynamically significant stenosis of the visualized portion of the aorta.  Conventional 3 vessel aortic branching pattern. The visualized proximal subclavian arteries are widely patent. RIGHT CAROTID SYSTEM: Normal without aneurysm, dissection or stenosis. LEFT CAROTID SYSTEM: Normal without aneurysm, dissection or stenosis. VERTEBRAL ARTERIES: Left dominant configuration. Both origins are clearly patent. There is no dissection, occlusion or flow-limiting stenosis to the skull base (V1-V3 segments). CTA HEAD FINDINGS POSTERIOR CIRCULATION: --Vertebral arteries: Normal V4 segments. --Inferior cerebellar arteries: Normal. --Basilar artery: Normal. --Superior cerebellar arteries: Normal. --Posterior cerebral arteries (PCA): Normal. ANTERIOR CIRCULATION: --Intracranial internal carotid arteries: Normal. --Anterior cerebral arteries (ACA): Normal. Both A1 segments are present. Patent anterior communicating artery (a-comm). --Middle cerebral arteries (MCA): Short segment occlusion of the inferior division left MCA M2 segment (series 10, image 16). The more distal portion is normal. Right MCA is normal. VENOUS SINUSES: As permitted by contrast timing, patent. ANATOMIC VARIANTS: None Review of the MIP images confirms the above findings. CT Brain Perfusion Findings: ASPECTS: 7 CBF (<30%) Volume: 72mL Perfusion (Tmax>6.0s) volume: 8mL Mismatch Volume: 15mL Infarction Location:None by CT perfusion criteria. IMPRESSION: 1. Short segment occlusion of the inferior division left MCA M2 segment. No other intracranial arterial occlusion or high-grade stenosis. 2. Normal CTA of the neck. 3. Normal CT perfusion study. These results were called by telephone at the time of interpretation on 09/16/2020 at 4:01 am to provider [facility] , who verbally acknowledged these results. Electronically Signed   By: Ulyses Jarred M.D.   On: 09/16/2020 04:02   CT ANGIO NECK CODE STROKE  Result  Date: 09/16/2020 CLINICAL DATA:  Headache and confusion EXAM: CT ANGIOGRAPHY HEAD AND NECK CT PERFUSION BRAIN TECHNIQUE:  Multidetector CT imaging of the head and neck was performed using the standard protocol during bolus administration of intravenous contrast. Multiplanar CT image reconstructions and MIPs were obtained to evaluate the vascular anatomy. Carotid stenosis measurements (when applicable) are obtained utilizing NASCET criteria, using the distal internal carotid diameter as the denominator. Multiphase CT imaging of the brain was performed following IV bolus contrast injection. Subsequent parametric perfusion maps were calculated using RAPID software. CONTRAST:  179mL OMNIPAQUE IOHEXOL 350 MG/ML SOLN COMPARISON:  None. FINDINGS: CTA NECK FINDINGS SKELETON: There is no bony spinal canal stenosis. No lytic or blastic lesion. OTHER NECK: Normal pharynx, larynx and major salivary glands. No cervical lymphadenopathy. Unremarkable thyroid gland. UPPER CHEST: No pneumothorax or pleural effusion. No nodules or masses. AORTIC ARCH: There is no calcific atherosclerosis of the aortic arch. There is no aneurysm, dissection or hemodynamically significant stenosis of the visualized portion of the aorta. Conventional 3 vessel aortic branching pattern. The visualized proximal subclavian arteries are widely patent. RIGHT CAROTID SYSTEM: Normal without aneurysm, dissection or stenosis. LEFT CAROTID SYSTEM: Normal without aneurysm, dissection or stenosis. VERTEBRAL ARTERIES: Left dominant configuration. Both origins are clearly patent. There is no dissection, occlusion or flow-limiting stenosis to the skull base (V1-V3 segments). CTA HEAD FINDINGS POSTERIOR CIRCULATION: --Vertebral arteries: Normal V4 segments. --Inferior cerebellar arteries: Normal. --Basilar artery: Normal. --Superior cerebellar arteries: Normal. --Posterior cerebral arteries (PCA): Normal. ANTERIOR CIRCULATION: --Intracranial internal carotid arteries: Normal. --Anterior cerebral arteries (ACA): Normal. Both A1 segments are present. Patent anterior communicating artery  (a-comm). --Middle cerebral arteries (MCA): Short segment occlusion of the inferior division left MCA M2 segment (series 10, image 16). The more distal portion is normal. Right MCA is normal. VENOUS SINUSES: As permitted by contrast timing, patent. ANATOMIC VARIANTS: None Review of the MIP images confirms the above findings. CT Brain Perfusion Findings: ASPECTS: 7 CBF (<30%) Volume: 52mL Perfusion (Tmax>6.0s) volume: 65mL Mismatch Volume: 17mL Infarction Location:None by CT perfusion criteria. IMPRESSION: 1. Short segment occlusion of the inferior division left MCA M2 segment. No other intracranial arterial occlusion or high-grade stenosis. 2. Normal CTA of the neck. 3. Normal CT perfusion study. These results were called by telephone at the time of interpretation on 09/16/2020 at 4:01 am to provider [facility] , who verbally acknowledged these results. Electronically Signed   By: Ulyses Jarred M.D.   On: 09/16/2020 04:02    Orson Eva, DO  [facility]  If 7PM-7AM, please contact night-coverage [email or web address] Password [email or web address] 09/16/2020, 6:57 AM   LOS: 0 days

## 2020-09-16 NOTE — Consult Note (Signed)
[place] A. Merlene Laughter, MD     [email or web address]          Erika Ruiz is an 55 y.o. female.   ASSESSMENT/PLAN: 1. Acute if these are temporal area. 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 Risk factors hypertension, dyslipidemia and age. Dual antiplatelet agents are recommended for 3 months. Additionally long-term statin use, control of blood pressure and dyslipidemia is recommended. 2. Remote infarcts; Given this 30 day event monitor is recommended to evaluate for occult paroxysmal atrial fibrillation. 3. Follow-up in the office with [place] in 6 weeks.   The patient is a 55 year old right-handed white female who presents with 4 day history of a moderate to severe left temporal frontal headaches. She reports not being headache person at baseline. She developed difficulty speaking but 2 days ago which resulted the patient is seeking medical attention. It appears she was not given tPA because of the mildness of her symptoms at presentation. She does not report palpitation, chest pain or dyspnea. There are no reports of focal numbness, weakness, dizziness or other neurological symptoms. She does have a baseline history of hypertension but reports that this has been controlled. There is also history of dyslipidemia although it is unclear why she was not on statin medications. The review systems otherwise negative.     GENERAL:  This is a pleasant female who is doing well at this time.  HEENT:  Neck is supple no trauma noted.  ABDOMEN: soft  EXTREMITIES: No edema   BACK: This is normal.  SKIN: Normal by inspection.    MENTAL STATUS: Alert and oriented.  Bedside naming shows subtle difficulties with word-finding difficulties. Fluency is reduced. Comprehensive and repetition are intact.   CRANIAL NERVES: Pupils are equal, round and reactive to light and accomodation; extra ocular movements are full, there is no significant nystagmus; visual fields are  full; upper and lower facial muscles are normal in strength and symmetric, there is no flattening of the nasolabial folds; tongue is midline; uvula is midline; shoulder elevation is normal.  MOTOR: Normal tone, bulk and strength; no pronator drift. There is no drift of the upper lower extremities.  COORDINATION: Left finger to nose is normal, right finger to nose is normal, No rest tremor; no intention tremor; no postural tremor; no bradykinesia.  REFLEXES: Deep tendon reflexes are symmetrical and normal.   SENSATION: Normal to light touch, temperature, and pain. There is no extension on double simultaneous stimulation.      Blood pressure (!) 183/84, pulse 86, temperature 98.8 F (37.1 C), temperature source Oral, resp. rate 15, height 5\' 6"  (1.676 m), weight 81.6 kg, SpO2 98 %.  Past Medical History:  Diagnosis Date  . Hyperlipemia   . Hypertension   . Uterine cancer ([place]) 1987  . Vitamin D deficiency disease     Past Surgical History:  Procedure Laterality Date  . ABDOMINAL HYSTERECTOMY     due to CA  . BIOPSY  03/13/2017   Procedure: BIOPSY;  Surgeon: Danie Binder, MD;  [facility];  Service: Endoscopy;;  gastric  . BREAST LUMPECTOMY Left   . COLONOSCOPY WITH PROPOFOL N/A 03/13/2017   Procedure: COLONOSCOPY WITH PROPOFOL;  Surgeon: Danie Binder, MD;  [facility];  Service: Endoscopy;  Laterality: N/A;  11:00am  . ESOPHAGOGASTRODUODENOSCOPY (EGD) WITH PROPOFOL N/A 03/13/2017   Procedure: ESOPHAGOGASTRODUODENOSCOPY (EGD) WITH PROPOFOL;  Surgeon: Danie Binder, MD;  [facility];  Service: Endoscopy;  Laterality:  N/A;  . LEFT OOPHORECTOMY    . POLYPECTOMY  03/13/2017   Procedure: POLYPECTOMY;  Surgeon: Danie Binder, MD;  [facility];  Service: Endoscopy;;  colon  . VAGINAL DELIVERY     X2    Family History  Problem Relation Age of Onset  . Ovarian cancer Paternal Aunt 28  . Colon cancer Sister 57  . Colon polyps  Brother   . Diabetes Father   . Heart disease Father   . Kidney disease Father   . Breast cancer Mother     Social History:  reports that she has never smoked. She has never used smokeless tobacco. She reports that she does not drink alcohol and does not use drugs.  Allergies:  Allergies  Allergen Reactions  . Sulfa Antibiotics Rash    Medications: Prior to Admission medications   Medication Sig Start Date End Date Taking? Authorizing Provider  albuterol (PROVENTIL HFA;VENTOLIN HFA) 108 (90 BASE) MCG/ACT inhaler Inhale 2 puffs into the lungs every 4 (four) hours as needed for wheezing or shortness of breath. 05/11/14  Yes Ward, Cyril Mourning N, DO  fluticasone (FLONASE) 50 MCG/ACT nasal spray Place 1 spray into both nostrils daily.   Yes [provider]  ibuprofen (ADVIL,MOTRIN) 200 MG tablet Take 400 mg by mouth every 6 (six) hours as needed for headache or moderate pain.   Yes [provider]  Oxycodone HCl 20 MG TABS Take 20 mg by mouth daily as needed (for pain).  02/09/17  Yes [provider]  pantoprazole (PROTONIX) 40 MG tablet Take 1 tablet (40 mg total) by mouth daily. 30 minutes prior to breakfast Patient taking differently: Take 40 mg by mouth daily as needed (for acid reflux). 02/14/17  Yes Annitta Needs, NP  telmisartan-hydrochlorothiazide (MICARDIS HCT) 40-12.5 MG per tablet Take 1 tablet by mouth daily.   Yes [provider]  ergocalciferol (VITAMIN D2) 1.25 MG (50000 UT) capsule Take by mouth. Patient not taking: Reported on 09/15/2020 07/02/17   [provider]    Scheduled Meds: . aspirin EC  81 mg Oral Daily  . clopidogrel  75 mg Oral Daily  . enoxaparin (LOVENOX) injection  40 mg Subcutaneous Q24H  . fluticasone  1 spray Each Nare Daily  . pantoprazole  40 mg Oral Daily   Continuous Infusions: . sodium chloride 100 mL/hr (09/15/20 2254)  . sodium chloride 10 mL/hr at 09/16/20 1609   PRN Meds:.acetaminophen **OR**  acetaminophen (TYLENOL) oral liquid 160 mg/5 mL **OR** acetaminophen, labetalol, senna-docusate     Results for orders placed or performed during the hospital encounter of 09/15/20 (from the past 48 hour(s))  Protime-INR     Status: None   Collection Time: 09/15/20 10:32 PM  Result Value Ref Range   Prothrombin Time 12.6 11.4 - 15.2 seconds   INR 1.0 0.8 - 1.2    Comment: (NOTE) INR goal varies based on device and disease states. Performed at [facility], [address], [place], [place] [address]   APTT     Status: None   Collection Time: 09/15/20 10:32 PM  Result Value Ref Range   aPTT 29 24 - 36 seconds    Comment: Performed at [facility], [address], [place], [place] [address]  CBC     Status: None   Collection Time: 09/15/20 10:32 PM  Result Value Ref Range   WBC 8.4 4.0 - 10.5 K/uL   RBC 4.75 3.87 - 5.11 MIL/uL   Hemoglobin 14.5 12.0 -  15.0 g/dL   HCT 43.1 36.0 - 46.0 %   MCV 90.7 80.0 - 100.0 fL   MCH 30.5 26.0 - 34.0 pg   MCHC 33.6 30.0 - 36.0 g/dL   RDW 13.2 11.5 - 15.5 %   Platelets 318 150 - 400 K/uL   nRBC 0.0 0.0 - 0.2 %    Comment: Performed at [facility], [address] [place], [place] [address]  Differential     Status: None   Collection Time: 09/15/20 10:32 PM  Result Value Ref Range   Neutrophils Relative % 58 %   Neutro Abs 4.8 1.7 - 7.7 K/uL   Lymphocytes Relative 31 %   Lymphs Abs 2.6 0.7 - 4.0 K/uL   Monocytes Relative 9 %   Monocytes Absolute 0.8 0.1 - 1.0 K/uL   Eosinophils Relative 1 %   Eosinophils Absolute 0.1 0.0 - 0.5 K/uL   Basophils Relative 1 %   Basophils Absolute 0.1 0.0 - 0.1 K/uL   Immature Granulocytes 0 %   Abs Immature Granulocytes 0.02 0.00 - 0.07 K/uL    Comment: Performed at [facility], [address] [place], [place] [address]  Comprehensive metabolic panel     Status: Abnormal   Collection Time: 09/15/20 10:32 PM  Result Value Ref Range   Sodium 137 135 - 145 mmol/L   Potassium 4.1 3.5 - 5.1 mmol/L   Chloride  101 98 - 111 mmol/L   CO2 29 22 - 32 mmol/L   Glucose, Bld 124 (H) 70 - 99 mg/dL    Comment: Glucose reference range applies only to samples taken after fasting for at least 8 hours.   BUN 13 6 - 20 mg/dL   Creatinine, Ser 0.83 0.44 - 1.00 mg/dL   Calcium 9.3 8.9 - 10.3 mg/dL   Total Protein 7.8 6.5 - 8.1 g/dL   Albumin 4.2 3.5 - 5.0 g/dL   AST 19 15 - 41 U/L   ALT 30 0 - 44 U/L   Alkaline Phosphatase 109 38 - 126 U/L   Total Bilirubin 0.6 0.3 - 1.2 mg/dL   GFR, Estimated >60 >60 mL/min    Comment: (NOTE) Calculated using the CKD-EPI Creatinine Equation (2021)    Anion gap 7 5 - 15    Comment: Performed at [facility], [address], [place], [place] [address]  Ethanol     Status: None   Collection Time: 09/15/20 10:34 PM  Result Value Ref Range   Alcohol, Ethyl (B) <10 <10 mg/dL    Comment: (NOTE) Lowest detectable limit for serum alcohol is 10 mg/dL.  For medical purposes only. Performed at [facility], [address], [place], [place] [address]   Urine rapid drug screen (hosp performed)not at [facility]     Status: None   Collection Time: 09/15/20 10:56 PM  Result Value Ref Range   Opiates NONE DETECTED NONE DETECTED   Cocaine NONE DETECTED NONE DETECTED   Benzodiazepines NONE DETECTED NONE DETECTED   Amphetamines NONE DETECTED NONE DETECTED   Tetrahydrocannabinol NONE DETECTED NONE DETECTED   Barbiturates NONE DETECTED NONE DETECTED    Comment: (NOTE) DRUG SCREEN FOR MEDICAL PURPOSES ONLY.  IF CONFIRMATION IS NEEDED FOR ANY PURPOSE, NOTIFY LAB WITHIN 5 DAYS.  LOWEST DETECTABLE LIMITS FOR URINE DRUG SCREEN Drug Class                     Cutoff (ng/mL) Amphetamine and metabolites    1000 Barbiturate and metabolites  200 Benzodiazepine                 [id number] Tricyclics and metabolites     300 Opiates and metabolites        300 Cocaine and metabolites        300 THC                            50 Performed at [facility], [address] [place], [place] [address]    Urinalysis, Routine w reflex microscopic     Status: None   Collection Time: 09/15/20 10:56 PM  Result Value Ref Range   Color, Urine YELLOW YELLOW   APPearance CLEAR CLEAR   Specific Gravity, Urine 1.016 1.005 - 1.030   pH 7.0 5.0 - 8.0   Glucose, UA NEGATIVE NEGATIVE mg/dL   Hgb urine dipstick NEGATIVE NEGATIVE   Bilirubin Urine NEGATIVE NEGATIVE   Ketones, ur NEGATIVE NEGATIVE mg/dL   Protein, ur NEGATIVE NEGATIVE mg/dL   Nitrite NEGATIVE NEGATIVE   Leukocytes,Ua NEGATIVE NEGATIVE    Comment: Performed at [facility], [address] [place],  [address]  Resp Panel by RT-PCR (Flu A&B, Covid) Nasopharyngeal Swab     Status: None   Collection Time: 09/16/20 12:21 AM   Specimen: Nasopharyngeal Swab; Nasopharyngeal(NP) swabs in vial transport medium  Result Value Ref Range   SARS Coronavirus 2 by RT PCR NEGATIVE NEGATIVE    Comment: (NOTE) SARS-CoV-2 target nucleic acids are NOT DETECTED.  The SARS-CoV-2 RNA is generally detectable in upper respiratory specimens during the acute phase of infection. The lowest concentration of SARS-CoV-2 viral copies this assay can detect is 138 copies/mL. A negative result does not preclude SARS-Cov-2 infection and should not be used as the sole basis for treatment or other patient management decisions. A negative result may occur with  improper specimen collection/handling, submission of specimen other than nasopharyngeal swab, presence of viral mutation(s) within the areas targeted by this assay, and inadequate number of viral copies(<138 copies/mL). A negative result must be combined with clinical observations, patient history, and epidemiological information. The expected result is Negative.  Fact Sheet for Patients:  EntrepreneurPulse.com.au  Fact Sheet for Healthcare Providers:  IncredibleEmployment.be  This test is no t yet approved or cleared by the [place] FDA and  has been  authorized for detection and/or diagnosis of SARS-CoV-2 by FDA under an Emergency Use Authorization (EUA). This EUA will remain  in effect (meaning this test can be used) for the duration of the COVID-19 declaration under Section 564(b)(1) of the Act, 21 U.S.C.section 360bbb-3(b)(1), unless the authorization is terminated  or revoked sooner.       Influenza A by PCR NEGATIVE NEGATIVE   Influenza B by PCR NEGATIVE NEGATIVE    Comment: (NOTE) The Xpert Xpress SARS-CoV-2/FLU/RSV plus assay is intended as an aid in the diagnosis of influenza from Nasopharyngeal swab specimens and should not be used as a sole basis for treatment. Nasal washings and aspirates are unacceptable for Xpert Xpress SARS-CoV-2/FLU/RSV testing.  Fact Sheet for Patients: EntrepreneurPulse.com.au  Fact Sheet for Healthcare Providers: IncredibleEmployment.be  This test is not yet approved or cleared by the [place] FDA and has been authorized for detection and/or diagnosis of SARS-CoV-2 by FDA under an Emergency Use Authorization (EUA). This EUA will remain in effect (meaning this test can be used) for the duration of the COVID-19 declaration under Section 564(b)(1) of the Act, 21 U.S.C. section 360bbb-3(b)(1),  unless the authorization is terminated or revoked.  Performed at [facility], [address], [place], [place] [address]   Lipid panel     Status: Abnormal   Collection Time: 09/16/20  8:40 AM  Result Value Ref Range   Cholesterol 259 (H) 0 - 200 mg/dL   Triglycerides 235 (H) <150 mg/dL   HDL 36 (L) >40 mg/dL   Total CHOL/HDL Ratio 7.2 RATIO   VLDL 47 (H) 0 - 40 mg/dL   LDL Cholesterol 176 (H) 0 - 99 mg/dL    Comment:        Total Cholesterol/HDL:CHD Risk Coronary Heart Disease Risk Table                     Men   Women  1/2 Average Risk   3.4   3.3  Average Risk       5.0   4.4  2 X Average Risk   9.6   7.1  3 X Average Risk  23.4   11.0        Use  the calculated Patient Ratio above and the CHD Risk Table to determine the patient's CHD Risk.        ATP III CLASSIFICATION (LDL):  <100     mg/dL   Optimal  100-129  mg/dL   Near or Above                    Optimal  130-159  mg/dL   Borderline  160-189  mg/dL   High  >190     mg/dL   Very High Performed at [facility] [place], [place] [address]   Hemoglobin A1c     Status: Abnormal   Collection Time: 09/16/20  8:40 AM  Result Value Ref Range   Hgb A1c MFr Bld 5.7 (H) 4.8 - 5.6 %    Comment: (NOTE) Pre diabetes:          5.7%-6.4%  Diabetes:              >6.4%  Glycemic control for   <7.0% adults with diabetes    Mean Plasma Glucose 116.89 mg/dL    Comment: Performed at [facility] [address], [place], [place] [address]    Studies/Results:  TTE 1. Left ventricular ejection fraction, by estimation, is 65 to 70%. The  left ventricle has normal function. The left ventricle has no regional  wall motion abnormalities. There is moderate left ventricular hypertrophy.  Left ventricular diastolic  parameters are indeterminate.  2. Right ventricular systolic function is normal. The right ventricular  size is normal. Tricuspid regurgitation signal is inadequate for assessing  PA pressure.  3. The mitral valve is grossly normal. No evidence of mitral valve  regurgitation.  4. The aortic valve is tricuspid. Aortic valve regurgitation is not  visualized.  5. The inferior vena cava is normal in size with greater than 50%  respiratory variability, suggesting right atrial pressure of 3 mmHg.      HEAD NECK CTA IMPRESSION: 1. Short segment occlusion of the inferior division left MCA M2 segment. No other intracranial arterial occlusion or high-grade stenosis. 2. Normal CTA of the neck. 3. Normal CT perfusion study.     BRAIN MRI FINDINGS: Brain: Area of restricted diffusion involving the left temporoparietal region, consistent with  acute/subacute infarct, corresponding to hypodensity seen on prior head CT. No hemorrhage, hydrocephalus, mass or midline shift.  Remote infarcts within the  pons, left caudate and bilateral corona radiata. Scattered foci of T2 hyperintense within the white matter of the cerebral hemispheres, nonspecific, most likely related to chronic microangiopathy.  Vascular: Major arterial flow voids maintained.  Skull and upper cervical spine: Normal marrow signal.  Sinuses/Orbits: Negative.  Other: None.  IMPRESSION: 1. 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 2. 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 3. 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     The brain MRI is in person.  Acute changes are noted DWI involving left temporal region left probable frontal areas.  Encephalomalacia the the cord new quite is noted consistent with lacune infarct that is remote. There is also bigger remote infarct involving the centrum semiovale on left side.  Malik Paar A. Merlene Laughter, M.D.  Diplomate, Tax adviser of Psychiatry and Neurology ( Neurology). 09/16/2020, 5:31 PM

## 2020-09-16 NOTE — H&P (Signed)
History and Physical  Erika Ruiz WUG:[id number] DOB: 20-Dec-1965 DOA: 09/15/2020  Referring physician: Merrily Pew, MD PCP: [name], DO  Patient coming from: Home  Chief Complaint: Speech difficulty  HPI: Erika Ruiz is a 55 y.o. female with medical history significant for GERD, hypertension, hyperlipidemia, chronic back pain who presents to the Emergency department accompanied by husband due to difficulty in speech where by she pauses in between speeches due to word finding difficulty.  Patient was last well-known on Monday (09/13/2020) and symptoms started with complaint of left-sided frontal headache which was persistent for about a day.  Most of the history was obtained from the ED physician and husband at bedside, per husband, he noted that patient was having difficulty in finishing her sentences.  It was thought that this was due to stress, so, patient rested on Tuesday (5/3), but symptoms persisted till Wednesday (5/4) and it was decided for patient to go to the ED for further evaluation and management. Patient denies fever, chills, chest pain, shortness of breath, nausea, vomiting or abdominal pain.  ED Course:  In the emergency department, she was hemodynamically stable, BP was 203/123, other vital signs were within normal range.  Work-up in the showed normal CBC and BMP, alcohol level was < 10, urine drug screen was negative and urinalysis was normal.  Influenza A, B and SARS coronavirus 2 was negative. CT head without contrast showed acute to subacute left MCA territory infarcts. CT angiography of head showed short segment occlusion of the inferior division left MCA M2 segment CT angiography of neck showed normal CTA of the neck IV labetalol 10 Mg x1 was given, IV hydration was provided.  Teleneurology was consulted admitting patient for further stroke work-up  Review of Systems: Constitutional: Negative for chills and fever.  HENT: Negative for ear pain and sore  throat.   Eyes: Negative for pain and visual disturbance.  Respiratory: Negative for cough, chest tightness and shortness of breath.   Cardiovascular: Negative for chest pain and palpitations.  Gastrointestinal: Negative for abdominal pain and vomiting.  Endocrine: Negative for polyphagia and polyuria.  Genitourinary: Negative for decreased urine volume, dysuria, enuresis Musculoskeletal: Negative for arthralgias and back pain.  Skin: Negative for color change and rash.  Allergic/Immunologic: Negative for immunocompromised state.  Neurological: Positive for word finding difficulty and headache.  Negative for tremors, syncope Hematological: Does not bruise/bleed easily.  All other systems reviewed and are negative   Past Medical History:  Diagnosis Date  . Hyperlipemia   . Hypertension   . Uterine cancer ([place]) 1987  . Vitamin D deficiency disease    Past Surgical History:  Procedure Laterality Date  . ABDOMINAL HYSTERECTOMY     due to CA  . BIOPSY  03/13/2017   Procedure: BIOPSY;  Surgeon: Danie Binder, MD;  [facility];  Service: Endoscopy;;  gastric  . BREAST LUMPECTOMY Left   . COLONOSCOPY WITH PROPOFOL N/A 03/13/2017   Procedure: COLONOSCOPY WITH PROPOFOL;  Surgeon: Danie Binder, MD;  Location: [facility];  Service: Endoscopy;  Laterality: N/A;  11:00am  . ESOPHAGOGASTRODUODENOSCOPY (EGD) WITH PROPOFOL N/A 03/13/2017   Procedure: ESOPHAGOGASTRODUODENOSCOPY (EGD) WITH PROPOFOL;  Surgeon: Danie Binder, MD;  Location: [facility];  Service: Endoscopy;  Laterality: N/A;  . LEFT OOPHORECTOMY    . POLYPECTOMY  03/13/2017   Procedure: POLYPECTOMY;  Surgeon: Danie Binder, MD;  Location: [facility];  Service: Endoscopy;;  colon  . VAGINAL DELIVERY  X2    Social History:  reports that she has never smoked. She has never used smokeless tobacco. She reports that she does not drink alcohol and does not use drugs.   Allergies  Allergen Reactions  .  Sulfa Antibiotics Rash    Family History  Problem Relation Age of Onset  . Ovarian cancer Paternal Aunt 61  . Colon cancer Sister 12  . Colon polyps Brother   . Diabetes Father   . Heart disease Father   . Kidney disease Father   . Breast cancer Mother      Prior to Admission medications   Medication Sig Start Date End Date Taking? Authorizing Provider  albuterol (PROVENTIL HFA;VENTOLIN HFA) 108 (90 BASE) MCG/ACT inhaler Inhale 2 puffs into the lungs every 4 (four) hours as needed for wheezing or shortness of breath. 05/11/14  Yes Ward, Cyril Mourning N, DO  fluticasone (FLONASE) 50 MCG/ACT nasal spray Place 1 spray into both nostrils daily.   Yes [provider]  ibuprofen (ADVIL,MOTRIN) 200 MG tablet Take 400 mg by mouth every 6 (six) hours as needed for headache or moderate pain.   Yes [provider]  Oxycodone HCl 20 MG TABS Take 20 mg by mouth daily as needed (for pain).  02/09/17  Yes [provider]  pantoprazole (PROTONIX) 40 MG tablet Take 1 tablet (40 mg total) by mouth daily. 30 minutes prior to breakfast Patient taking differently: Take 40 mg by mouth daily as needed (for acid reflux). 02/14/17  Yes Annitta Needs, NP  telmisartan-hydrochlorothiazide (MICARDIS HCT) 40-12.5 MG per tablet Take 1 tablet by mouth daily.   Yes [provider]  ergocalciferol (VITAMIN D2) 1.25 MG (50000 UT) capsule Take by mouth. Patient not taking: Reported on 09/15/2020 07/02/17   [provider]    Physical Exam: BP (!) 155/93   Pulse 74   Temp 98.7 F (37.1 C) (Oral)   Resp 13   Ht 5\' 6"  (1.676 m)   Wt 81.6 kg   SpO2 98%   BMI 29.05 kg/m   . General: 55 y.o. year-old female well developed well nourished in no acute distress.  Alert and oriented x3. . Cardiovascular: Regular rate and rhythm with no rubs or gallops.  No thyromegaly or JVD noted.  No lower extremity edema. 2/4 pulses in all 4 extremities. Marland Kitchen Respiratory: Clear to auscultation with no  wheezes or rales. Good inspiratory effort. . Abdomen: Soft nontender nondistended with normal bowel sounds x4 quadrants. . Muskuloskeletal: No cyanosis, clubbing or edema noted bilaterally . Neuro: CN II-XII intact, strength 5x5/4, sensation, reflexes intact; NIHSS-1(mild-moderate aphasia) . Skin: No ulcerative lesions noted or rashes . Psychiatry: Judgement and insight appear normal. Mood is appropriate for condition and setting          Labs on Admission:  Basic Metabolic Panel: Recent Labs  Lab 09/15/20 2232  NA 137  K 4.1  CL 101  CO2 29  GLUCOSE 124*  BUN 13  CREATININE 0.83  CALCIUM 9.3   Liver Function Tests: Recent Labs  Lab 09/15/20 2232  AST 19  ALT 30  ALKPHOS 109  BILITOT 0.6  PROT 7.8  ALBUMIN 4.2   No results for input(s): LIPASE, AMYLASE in the last 168 hours. No results for input(s): AMMONIA in the last 168 hours. CBC: Recent Labs  Lab 09/15/20 2232  WBC 8.4  NEUTROABS 4.8  HGB 14.5  HCT 43.1  MCV 90.7  PLT 318   Cardiac Enzymes: No results for input(s):  CKTOTAL, CKMB, CKMBINDEX, TROPONINI in the last 168 hours.  BNP (last 3 results) No results for input(s): BNP in the last 8760 hours.  ProBNP (last 3 results) No results for input(s): PROBNP in the last 8760 hours.  CBG: No results for input(s): GLUCAP in the last 168 hours.  Radiological Exams on Admission: CT HEAD WO CONTRAST  Result Date: 09/15/2020 CLINICAL DATA:  Confusion, headache, last known well 2 days ago EXAM: CT HEAD WITHOUT CONTRAST TECHNIQUE: Contiguous axial images were obtained from the base of the skull through the vertex without intravenous contrast. COMPARISON:  None. FINDINGS: Brain: Hypodensities are seen within the left temporal and parietal regions consistent with acute to subacute distal left MCA territory infarcts. Largest hypodensity in the left temporal lobe measures 2.8 cm. No mass effect. No evidence of acute hemorrhage. Focal hypodensity left basal ganglia  consistent with chronic lacunar infarct. The lateral ventricles and remaining midline structures are unremarkable. No acute extra-axial fluid collections. No mass effect. Vascular: No hyperdense vessel or unexpected calcification. Skull: Normal. Negative for fracture or focal lesion. Sinuses/Orbits: No acute finding. Other: None. IMPRESSION: 1. Acute to subacute left MCA territory infarcts. No mass effect or hemorrhage. Critical Value/emergent results were called by telephone at the time of interpretation on 09/15/2020 at 11:27 pm to provider [facility] , who verbally acknowledged these results. Electronically Signed   By: Sharlet Salina M.D.   On: 09/15/2020 23:27   CT CEREBRAL PERFUSION W CONTRAST  Result Date: 09/16/2020 CLINICAL DATA:  Headache and confusion EXAM: CT ANGIOGRAPHY HEAD AND NECK CT PERFUSION BRAIN TECHNIQUE: Multidetector CT imaging of the head and neck was performed using the standard protocol during bolus administration of intravenous contrast. Multiplanar CT image reconstructions and MIPs were obtained to evaluate the vascular anatomy. Carotid stenosis measurements (when applicable) are obtained utilizing NASCET criteria, using the distal internal carotid diameter as the denominator. Multiphase CT imaging of the brain was performed following IV bolus contrast injection. Subsequent parametric perfusion maps were calculated using RAPID software. CONTRAST:  OMNIPAQUE IOHEXOL 350 MG/ML SOLN COMPARISON:  None. FINDINGS: CTA NECK FINDINGS SKELETON: There is no bony spinal canal stenosis. No lytic or blastic lesion. OTHER NECK: Normal pharynx, larynx and major salivary glands. No cervical lymphadenopathy. Unremarkable thyroid gland. UPPER CHEST: No pneumothorax or pleural effusion. No nodules or masses. AORTIC ARCH: There is no calcific atherosclerosis of the aortic arch. There is no aneurysm, dissection or hemodynamically significant stenosis of the visualized portion of the aorta. Conventional  3 vessel aortic branching pattern. The visualized proximal subclavian arteries are widely patent. RIGHT CAROTID SYSTEM: Normal without aneurysm, dissection or stenosis. LEFT CAROTID SYSTEM: Normal without aneurysm, dissection or stenosis. VERTEBRAL ARTERIES: Left dominant configuration. Both origins are clearly patent. There is no dissection, occlusion or flow-limiting stenosis to the skull base (V1-V3 segments). CTA HEAD FINDINGS POSTERIOR CIRCULATION: --Vertebral arteries: Normal V4 segments. --Inferior cerebellar arteries: Normal. --Basilar artery: Normal. --Superior cerebellar arteries: Normal. --Posterior cerebral arteries (PCA): Normal. ANTERIOR CIRCULATION: --Intracranial internal carotid arteries: Normal. --Anterior cerebral arteries (ACA): Normal. Both A1 segments are present. Patent anterior communicating artery (a-comm). --Middle cerebral arteries (MCA): Short segment occlusion of the inferior division left MCA M2 segment (series 10, image 16). The more distal portion is normal. Right MCA is normal. VENOUS SINUSES: As permitted by contrast timing, patent. ANATOMIC VARIANTS: None Review of the MIP images confirms the above findings. CT Brain Perfusion Findings: ASPECTS: 7 CBF (<30%) Volume: 39mL Perfusion (Tmax>6.0s) volume: 16mL Mismatch Volume: 40mL Infarction Location:None  by CT perfusion criteria. IMPRESSION: 1. Short segment occlusion of the inferior division left MCA M2 segment. No other intracranial arterial occlusion or high-grade stenosis. 2. Normal CTA of the neck. 3. Normal CT perfusion study. These results were called by telephone at the time of interpretation on 09/16/2020 at 4:01 am to provider [facility] , who verbally acknowledged these results. Electronically Signed   By: Ulyses Jarred M.D.   On: 09/16/2020 04:02   CT ANGIO HEAD CODE STROKE  Result Date: 09/16/2020 CLINICAL DATA:  Headache and confusion EXAM: CT ANGIOGRAPHY HEAD AND NECK CT PERFUSION BRAIN TECHNIQUE: Multidetector CT  imaging of the head and neck was performed using the standard protocol during bolus administration of intravenous contrast. Multiplanar CT image reconstructions and MIPs were obtained to evaluate the vascular anatomy. Carotid stenosis measurements (when applicable) are obtained utilizing NASCET criteria, using the distal internal carotid diameter as the denominator. Multiphase CT imaging of the brain was performed following IV bolus contrast injection. Subsequent parametric perfusion maps were calculated using RAPID software. CONTRAST:  136mL OMNIPAQUE IOHEXOL 350 MG/ML SOLN COMPARISON:  None. FINDINGS: CTA NECK FINDINGS SKELETON: There is no bony spinal canal stenosis. No lytic or blastic lesion. OTHER NECK: Normal pharynx, larynx and major salivary glands. No cervical lymphadenopathy. Unremarkable thyroid gland. UPPER CHEST: No pneumothorax or pleural effusion. No nodules or masses. AORTIC ARCH: There is no calcific atherosclerosis of the aortic arch. There is no aneurysm, dissection or hemodynamically significant stenosis of the visualized portion of the aorta. Conventional 3 vessel aortic branching pattern. The visualized proximal subclavian arteries are widely patent. RIGHT CAROTID SYSTEM: Normal without aneurysm, dissection or stenosis. LEFT CAROTID SYSTEM: Normal without aneurysm, dissection or stenosis. VERTEBRAL ARTERIES: Left dominant configuration. Both origins are clearly patent. There is no dissection, occlusion or flow-limiting stenosis to the skull base (V1-V3 segments). CTA HEAD FINDINGS POSTERIOR CIRCULATION: --Vertebral arteries: Normal V4 segments. --Inferior cerebellar arteries: Normal. --Basilar artery: Normal. --Superior cerebellar arteries: Normal. --Posterior cerebral arteries (PCA): Normal. ANTERIOR CIRCULATION: --Intracranial internal carotid arteries: Normal. --Anterior cerebral arteries (ACA): Normal. Both A1 segments are present. Patent anterior communicating artery (a-comm). --Middle  cerebral arteries (MCA): Short segment occlusion of the inferior division left MCA M2 segment (series 10, image 16). The more distal portion is normal. Right MCA is normal. VENOUS SINUSES: As permitted by contrast timing, patent. ANATOMIC VARIANTS: None Review of the MIP images confirms the above findings. CT Brain Perfusion Findings: ASPECTS: 7 CBF (<30%) Volume: 24mL Perfusion (Tmax>6.0s) volume: 52mL Mismatch Volume: 51mL Infarction Location:None by CT perfusion criteria. IMPRESSION: 1. Short segment occlusion of the inferior division left MCA M2 segment. No other intracranial arterial occlusion or high-grade stenosis. 2. Normal CTA of the neck. 3. Normal CT perfusion study. These results were called by telephone at the time of interpretation on 09/16/2020 at 4:01 am to provider [facility] , who verbally acknowledged these results. Electronically Signed   By: Ulyses Jarred M.D.   On: 09/16/2020 04:02   CT ANGIO NECK CODE STROKE  Result Date: 09/16/2020 CLINICAL DATA:  Headache and confusion EXAM: CT ANGIOGRAPHY HEAD AND NECK CT PERFUSION BRAIN TECHNIQUE: Multidetector CT imaging of the head and neck was performed using the standard protocol during bolus administration of intravenous contrast. Multiplanar CT image reconstructions and MIPs were obtained to evaluate the vascular anatomy. Carotid stenosis measurements (when applicable) are obtained utilizing NASCET criteria, using the distal internal carotid diameter as the denominator. Multiphase CT imaging of the brain was performed following IV bolus contrast injection.  Subsequent parametric perfusion maps were calculated using RAPID software. CONTRAST:  174mL OMNIPAQUE IOHEXOL 350 MG/ML SOLN COMPARISON:  None. FINDINGS: CTA NECK FINDINGS SKELETON: There is no bony spinal canal stenosis. No lytic or blastic lesion. OTHER NECK: Normal pharynx, larynx and major salivary glands. No cervical lymphadenopathy. Unremarkable thyroid gland. UPPER CHEST: No pneumothorax or  pleural effusion. No nodules or masses. AORTIC ARCH: There is no calcific atherosclerosis of the aortic arch. There is no aneurysm, dissection or hemodynamically significant stenosis of the visualized portion of the aorta. Conventional 3 vessel aortic branching pattern. The visualized proximal subclavian arteries are widely patent. RIGHT CAROTID SYSTEM: Normal without aneurysm, dissection or stenosis. LEFT CAROTID SYSTEM: Normal without aneurysm, dissection or stenosis. VERTEBRAL ARTERIES: Left dominant configuration. Both origins are clearly patent. There is no dissection, occlusion or flow-limiting stenosis to the skull base (V1-V3 segments). CTA HEAD FINDINGS POSTERIOR CIRCULATION: --Vertebral arteries: Normal V4 segments. --Inferior cerebellar arteries: Normal. --Basilar artery: Normal. --Superior cerebellar arteries: Normal. --Posterior cerebral arteries (PCA): Normal. ANTERIOR CIRCULATION: --Intracranial internal carotid arteries: Normal. --Anterior cerebral arteries (ACA): Normal. Both A1 segments are present. Patent anterior communicating artery (a-comm). --Middle cerebral arteries (MCA): Short segment occlusion of the inferior division left MCA M2 segment (series 10, image 16). The more distal portion is normal. Right MCA is normal. VENOUS SINUSES: As permitted by contrast timing, patent. ANATOMIC VARIANTS: None Review of the MIP images confirms the above findings. CT Brain Perfusion Findings: ASPECTS: 7 CBF (<30%) Volume: 48mL Perfusion (Tmax>6.0s) volume: 19mL Mismatch Volume: 35mL Infarction Location:None by CT perfusion criteria. IMPRESSION: 1. Short segment occlusion of the inferior division left MCA M2 segment. No other intracranial arterial occlusion or high-grade stenosis. 2. Normal CTA of the neck. 3. Normal CT perfusion study. These results were called by telephone at the time of interpretation on 09/16/2020 at 4:01 am to provider [facility] , who verbally acknowledged these results. Electronically  Signed   By: Ulyses Jarred M.D.   On: 09/16/2020 04:02    EKG: I independently viewed the EKG done and my findings are as followed: Sinus tachycardia at a rate of 109 bpm  Assessment/Plan Present on Admission: **None**  Principal Problem:   Acute ischemic stroke ([place]) Active Problems:   Essential hypertension   Chronic back pain   GERD (gastroesophageal reflux disease)   Hyperlipidemia  Acute/subacute left MCA infarct Patient will be admitted to telemetry unit  CT angiography of head showed short segment occlusion of the inferior division left MCA M2 CT angiography of neck was normal Echocardiogram will be done in the morning MRA of head and neck Continue aspirin, Plavix per Neurology recommendation Consider starting patient on statin Continue fall precautions and neuro checks Lipid panel and hemoglobin A1c will be checked Continue PT//OT eval and treat Consider speech therapy if patient fails bedside swallow eval Bedside swallow eval by nursing prior to diet Tele neurology consult appreciated Neurology will be consulted  Essential hypertension BP meds will be held at this time for permissive hypertension  Hyperlipidemia No antilipid medication noted on patient's med rec  GERD Continue Protonix  Chronic back pain Continue Tylenol Consider restarting patient's home oxycodone   DVT prophylaxis: SCDs  Code Status: Full code  Family Communication: Husband at bedside (all questions answered to satisfaction)  Disposition Plan:  Patient is from:                        home Anticipated DC to:  family members home Anticipated DC date:               1 day Anticipated DC barriers:           Patient requires inpatient management for stroke work-up   Consults called: Neurology  Admission status: Observation    Bernadette Hoit MD [facility]  09/16/2020, 8:11 AM

## 2020-09-16 NOTE — ED Notes (Signed)
Pt has been ambulating to bathroom with out the need for help.

## 2020-09-16 NOTE — ED Notes (Signed)
Physical therapy and Occ therapy in room .

## 2020-09-16 NOTE — Evaluation (Signed)
Physical Therapy Evaluation Patient Details Name: Erika Ruiz MRN: [id number] DOB: Jun 13, 1965 Today's Date: 09/16/2020   History of Present Illness  HPI: Erika Ruiz is a 55 y.o. female with medical history significant for GERD, hypertension, hyperlipidemia, chronic back pain who presents to the Emergency department accompanied by husband due to difficulty in speech where by she pauses in between speeches due to word finding difficulty.  Patient was last well-known on Monday (09/13/2020) and symptoms started with complaint of left-sided frontal headache which was persistent for about a day.  Most of the history was obtained from the ED physician and husband at bedside, per husband, he noted that patient was having difficulty in finishing her sentences.  It was thought that this was due to stress, so, patient rested on Tuesday (5/3), but symptoms persisted till Wednesday (5/4) and it was decided for patient to go to the ED for further evaluation and management.  Patient denies fever, chills, chest pain, shortness of breath, nausea, vomiting or abdominal pain.    Clinical Impression  Patient functioning at baseline for functional mobility and gait.  Plan:  Patient discharged from physical therapy to care of nursing for ambulation daily as tolerated for length of stay.     Follow Up Recommendations No PT follow up    Equipment Recommendations  None recommended by PT    Recommendations for Other Services       Precautions / Restrictions Precautions Precautions: None Restrictions Weight Bearing Restrictions: No      Mobility  Bed Mobility Overal bed mobility: Independent                  Transfers Overall transfer level: Independent Equipment used: None             General transfer comment: Independent functional ambulation around ED and back to bed.  Ambulation/Gait Ambulation/Gait assistance: Modified independent (Device/Increase time) Gait Distance (Feet):  200 Feet Assistive device: None Gait Pattern/deviations: WFL(Within Functional Limits) Gait velocity: near normal   General Gait Details: demonstrates good return for ambulation in room and hallways without loss of balance  Stairs            Wheelchair Mobility    Modified Rankin (Stroke Patients Only)       Balance Overall balance assessment: Independent                                           Pertinent Vitals/Pain Pain Assessment: No/denies pain    Home Living Family/patient expects to be discharged to:: Private residence Living Arrangements: Spouse/significant other Available Help at Discharge: Available 24 hours/day Type of Home: House Home Access: Level entry     Home Layout: One level Home Equipment: None      Prior Function Level of Independence: Independent         Comments: Hydrographic surveyor, drives     Hand Dominance   Dominant Hand: Right    Extremity/Trunk Assessment   Upper Extremity Assessment Upper Extremity Assessment: Defer to OT evaluation    Lower Extremity Assessment Lower Extremity Assessment: Overall WFL for tasks assessed    Cervical / Trunk Assessment Cervical / Trunk Assessment: Normal  Communication   Communication: No difficulties  Cognition Arousal/Alertness: Awake/alert Behavior During Therapy: WFL for tasks assessed/performed Overall Cognitive Status: Within Functional Limits for tasks assessed  General Comments: mild flat affect      General Comments      Exercises     Assessment/Plan    PT Assessment Patent does not need any further PT services  PT Problem List         PT Treatment Interventions      PT Goals (Current goals can be found in the Care Plan section)  Acute Rehab PT Goals Patient Stated Goal: return home PT Goal Formulation: With patient Time For Goal Achievement: 09/16/20 Potential to Achieve Goals: Good     Frequency     Barriers to discharge        Co-evaluation PT/OT/SLP Co-Evaluation/Treatment: Yes Reason for Co-Treatment: To address functional/ADL transfers PT goals addressed during session: Mobility/safety with mobility;Balance OT goals addressed during session: ADL's and self-care;Strengthening/ROM       AM-PAC PT "6 Clicks" Mobility  Outcome Measure Help needed turning from your back to your side while in a flat bed without using bedrails?: None Help needed moving from lying on your back to sitting on the side of a flat bed without using bedrails?: None Help needed moving to and from a bed to a chair (including a wheelchair)?: None Help needed standing up from a chair using your arms (e.g., wheelchair or bedside chair)?: None Help needed to walk in hospital room?: None Help needed climbing 3-5 steps with a railing? : None 6 Click Score: 24    End of Session   Activity Tolerance: Patient tolerated treatment well Patient left: in bed;with call bell/phone within reach Nurse Communication: Mobility status PT Visit Diagnosis: Unsteadiness on feet (R26.81);Other abnormalities of gait and mobility (R26.89);Muscle weakness (generalized) (M62.81)    Time: 0263-7858 PT Time Calculation (min) (ACUTE ONLY): 15 min   Charges:   PT Evaluation $PT Eval Low Complexity: 1 Low PT Treatments $Therapeutic Activity: 8-22 mins        12:21 PM, 09/16/20 Lonell Grandchild, MPT Physical Therapist with [facility] 336 [phone] office [phone] mobile phone

## 2020-09-17 ENCOUNTER — Telehealth: Payer: Self-pay

## 2020-09-17 DIAGNOSIS — E782 Mixed hyperlipidemia: Secondary | ICD-10-CM

## 2020-09-17 DIAGNOSIS — I639 Cerebral infarction, unspecified: Secondary | ICD-10-CM | POA: Diagnosis not present

## 2020-09-17 DIAGNOSIS — I10 Essential (primary) hypertension: Secondary | ICD-10-CM | POA: Diagnosis not present

## 2020-09-17 LAB — HIV ANTIBODY (ROUTINE TESTING W REFLEX): HIV Screen 4th Generation wRfx: NONREACTIVE

## 2020-09-17 MED ORDER — CLOPIDOGREL BISULFATE 75 MG PO TABS: 75.0000 mg | ORAL_TABLET | Freq: Every day | ORAL | 2 refills | Status: DC

## 2020-09-17 MED ORDER — ASPIRIN 81 MG PO TBEC: 81.0000 mg | DELAYED_RELEASE_TABLET | Freq: Every day | ORAL | 11 refills | Status: AC

## 2020-09-17 MED ORDER — ATORVASTATIN CALCIUM 40 MG PO TABS: 40.0000 mg | ORAL_TABLET | Freq: Every day | ORAL | Status: DC

## 2020-09-17 MED ORDER — ATORVASTATIN CALCIUM 40 MG PO TABS: 40.0000 mg | ORAL_TABLET | Freq: Every day | ORAL | 2 refills | Status: DC

## 2020-09-17 NOTE — Discharge Summary (Signed)
Physician Discharge Summary  Erika Ruiz [id number] DOB: 23-Aug-1965 DOA: 09/15/2020  PCP: [name], DO  Admit date: 09/15/2020 Discharge date: 09/17/2020  Admitted From: Home Disposition:  Home   Recommendations for Outpatient Follow-up:  1. Follow up with PCP in 1-2 weeks 2. Please obtain BMP/CBC in one week     Discharge Condition: Stable CODE STATUS: FULL Diet recommendation: Heart Healthy / Carb Modified   Brief/Interim Summary: 55 year old female with a history of hypertension, hyperlipidemia, chronic back pain, and GERD presenting with word finding difficulty.  The patient was last known normal on 09/13/2020.  Her symptoms began with a left-sided frontal headache on 5-22.  This persisted into 09/14/2020.  Her spouse noted the patient to be "confused" on 09/14/2020 with difficulty finishing her sentences.  The patient had some word finding difficulty which persisted into 09/15/2020.  Her headache improved, but she continued to have slow responses to questions.  As result, the patient was brought to emergency department for further evaluation.  The patient herself denies any fevers, chills, neck pain, chest pain, shortness of breath, coughing, hemoptysis, nausea, vomiting, direct abdominal pain, dysuria, hematuria. In the emergency department, the patient was afebrile and hemodynamically stable with blood pressure 203/123.  Oxygen saturation was 97%-100% on room air.  CT of the brain showed an acute to subacute left MCA infarct.  Teleneurology was consulted and recommended admission for further work-up.  Discharge Diagnoses:  Acute/subacute left MCA infarct -Appreciate Neurology Consult -PT/OT evaluation -Speech therapy eval -CT brain--acute to subacute left MCA infarct -MRI brain--acute/subacute L-MCA infarct; remote bilateral infarct corona radiata,left caudate and pons -MRA brain--Unchanged short segment occlusion with reconstitution or high-grade stenosis of proximal  inferiorly directed branch of the left M1 MCA. -CTA H&N--normal CTA head and neck without any hemodynamically significant stenosis; short segment inferior left MCA M2 stenosis -CT brain perfusion normal -Echo--EF 65-70%, no WMA ,no PFO -LDL--176 -HbA1C--5.7 -Antiplatelet--ASA 81 & plavix x 90 days, then plavix alone  Essential hypertension -Holding telmisartan/HCTZ to allow for permissive hypertension -Hydralazine prn SBP>220  Chronic back pain -Continue home dose Oxycodone -PMDP reviewed--pt receives oxycodone 15 mg #90 monthly  Impaired glucose tolerance -lifestyle modification -A1C--5.7  Mixed Hyperlipidemia -start lipitor 40 mg daily  Discharge Instructions   Allergies as of 09/17/2020      Reactions   Sulfa Antibiotics Rash      Medication List    STOP taking these medications   ergocalciferol 1.25 MG (50000 UT) capsule Commonly known as: VITAMIN D2   ibuprofen 200 MG tablet Commonly known as: ADVIL     TAKE these medications   albuterol 108 (90 Base) MCG/ACT inhaler Commonly known as: VENTOLIN HFA Inhale 2 puffs into the lungs every 4 (four) hours as needed for wheezing or shortness of breath.   aspirin 81 MG EC tablet Take 1 tablet (81 mg total) by mouth daily. Swallow whole. X 90 days Start taking on: Sep 18, 2020   atorvastatin 40 MG tablet Commonly known as: LIPITOR Take 1 tablet (40 mg total) by mouth daily.   clopidogrel 75 MG tablet Commonly known as: PLAVIX Take 1 tablet (75 mg total) by mouth daily. Start taking on: Sep 18, 2020   fluticasone 50 MCG/ACT nasal spray Commonly known as: FLONASE Place 1 spray into both nostrils daily.   Oxycodone HCl 20 MG Tabs Take 20 mg by mouth daily as needed (for pain).   pantoprazole 40 MG tablet Commonly known as: PROTONIX Take 1 tablet (40 mg total) by  mouth daily. 30 minutes prior to breakfast What changed:   when to take this  reasons to take this  additional instructions    telmisartan-hydrochlorothiazide 40-12.5 MG tablet Commonly known as: MICARDIS HCT Take 1 tablet by mouth daily.       Allergies  Allergen Reactions  . Sulfa Antibiotics Rash    Consultations:  neurology   Procedures/Studies: CT HEAD WO CONTRAST  Result Date: 09/15/2020 CLINICAL DATA:  Confusion, headache, last known well 2 days ago EXAM: CT HEAD WITHOUT CONTRAST TECHNIQUE: Contiguous axial images were obtained from the base of the skull through the vertex without intravenous contrast. COMPARISON:  None. FINDINGS: Brain: Hypodensities are seen within the left temporal and parietal regions consistent with acute to subacute distal left MCA territory infarcts. Largest hypodensity in the left temporal lobe measures 2.8 cm. No mass effect. No evidence of acute hemorrhage. Focal hypodensity left basal ganglia consistent with chronic lacunar infarct. The lateral ventricles and remaining midline structures are unremarkable. No acute extra-axial fluid collections. No mass effect. Vascular: No hyperdense vessel or unexpected calcification. Skull: Normal. Negative for fracture or focal lesion. Sinuses/Orbits: No acute finding. Other: None. IMPRESSION: 1. Acute to subacute left MCA territory infarcts. No mass effect or hemorrhage. Critical Value/emergent results were called by telephone at the time of interpretation on 09/15/2020 at 11:27 pm to provider [facility] , who verbally acknowledged these results. Electronically Signed   By: Randa Ngo M.D.   On: 09/15/2020 23:27   MR ANGIO HEAD WO CONTRAST  Result Date: 09/16/2020 CLINICAL DATA:  Left MCA stroke with M2 MCA occlusion on CTA EXAM: MRA HEAD WITHOUT CONTRAST TECHNIQUE: Angiographic images of the Circle of Willis were obtained using MRA technique without intravenous contrast. COMPARISON:  Earlier same day FINDINGS: Motion artifact is present. Intracranial internal carotid arteries are patent. Right middle and both anterior cerebral arteries are  patent. Left M1 MCA is patent. As seen on the CTA, there is short segment occlusion or high-grade stenosis of a proximal inferiorly directed branch of the left M1 MCA with reconstitution. Intracranial vertebral arteries, basilar artery, posterior cerebral arteries are patent. There is no aneurysm. IMPRESSION: Unchanged short segment occlusion with reconstitution or high-grade stenosis of proximal inferiorly directed branch of the left M1 MCA. No new vascular findings. Electronically Signed   By: Macy Mis M.D.   On: 09/16/2020 17:00   MR BRAIN WO CONTRAST  Result Date: 09/16/2020 CLINICAL DATA:  Neuro deficit, acute, stroke suspected. EXAM: MRI HEAD WITHOUT CONTRAST TECHNIQUE: Multiplanar, multiecho pulse sequences of the brain and surrounding structures were obtained without intravenous contrast. COMPARISON:  Head CT Sep 15, 2020. FINDINGS: Brain: Area of restricted diffusion involving the left temporoparietal region, consistent with acute/subacute infarct, corresponding to hypodensity seen on prior head CT. No hemorrhage, hydrocephalus, mass or midline shift. Remote infarcts within the pons, left caudate and bilateral corona radiata. Scattered foci of T2 hyperintense within the white matter of the cerebral hemispheres, nonspecific, most likely related to chronic microangiopathy. Vascular: Major arterial flow voids maintained. Skull and upper cervical spine: Normal marrow signal. Sinuses/Orbits: Negative. Other: None. IMPRESSION: 1. 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 2. 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 3. 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 Electronically Signed   By: Pedro Earls M.D.   On: 09/16/2020 10:37   CT CEREBRAL PERFUSION W CONTRAST  Result Date: 09/16/2020  CLINICAL DATA:  Headache and confusion EXAM: CT ANGIOGRAPHY  HEAD AND NECK CT PERFUSION BRAIN TECHNIQUE: Multidetector CT imaging of the head and neck was performed using the standard protocol during bolus administration of intravenous contrast. Multiplanar CT image reconstructions and MIPs were obtained to evaluate the vascular anatomy. Carotid stenosis measurements (when applicable) are obtained utilizing NASCET criteria, using the distal internal carotid diameter as the denominator. Multiphase CT imaging of the brain was performed following IV bolus contrast injection. Subsequent parametric perfusion maps were calculated using RAPID software. CONTRAST:  174mL OMNIPAQUE IOHEXOL 350 MG/ML SOLN COMPARISON:  None. FINDINGS: CTA NECK FINDINGS SKELETON: There is no bony spinal canal stenosis. No lytic or blastic lesion. OTHER NECK: Normal pharynx, larynx and major salivary glands. No cervical lymphadenopathy. Unremarkable thyroid gland. UPPER CHEST: No pneumothorax or pleural effusion. No nodules or masses. AORTIC ARCH: There is no calcific atherosclerosis of the aortic arch. There is no aneurysm, dissection or hemodynamically significant stenosis of the visualized portion of the aorta. Conventional 3 vessel aortic branching pattern. The visualized proximal subclavian arteries are widely patent. RIGHT CAROTID SYSTEM: Normal without aneurysm, dissection or stenosis. LEFT CAROTID SYSTEM: Normal without aneurysm, dissection or stenosis. VERTEBRAL ARTERIES: Left dominant configuration. Both origins are clearly patent. There is no dissection, occlusion or flow-limiting stenosis to the skull base (V1-V3 segments). CTA HEAD FINDINGS POSTERIOR CIRCULATION: --Vertebral arteries: Normal V4 segments. --Inferior cerebellar arteries: Normal. --Basilar artery: Normal. --Superior cerebellar arteries: Normal. --Posterior cerebral arteries (PCA): Normal. ANTERIOR CIRCULATION: --Intracranial internal carotid arteries: Normal. --Anterior cerebral arteries (ACA): Normal. Both A1 segments are  present. Patent anterior communicating artery (a-comm). --Middle cerebral arteries (MCA): Short segment occlusion of the inferior division left MCA M2 segment (series 10, image 16). The more distal portion is normal. Right MCA is normal. VENOUS SINUSES: As permitted by contrast timing, patent. ANATOMIC VARIANTS: None Review of the MIP images confirms the above findings. CT Brain Perfusion Findings: ASPECTS: 7 CBF (<30%) Volume: 48mL Perfusion (Tmax>6.0s) volume: 27mL Mismatch Volume: 27mL Infarction Location:None by CT perfusion criteria. IMPRESSION: 1. Short segment occlusion of the inferior division left MCA M2 segment. No other intracranial arterial occlusion or high-grade stenosis. 2. Normal CTA of the neck. 3. Normal CT perfusion study. These results were called by telephone at the time of interpretation on 09/16/2020 at 4:01 am to provider [facility] , who verbally acknowledged these results. Electronically Signed   By: Ulyses Jarred M.D.   On: 09/16/2020 04:02   ECHOCARDIOGRAM COMPLETE  Result Date: 09/16/2020    ECHOCARDIOGRAM REPORT   Patient Name:   Erika Ruiz Date of Exam: 09/16/2020 Medical Rec #:  [id number]        Height:       66.0 in Accession #:    [id number]       Weight:       180.0 lb Date of Birth:  February 03, 1966       BSA:          1.912 m Patient Age:    5 years         BP:           155/93 mmHg Patient Gender: F                HR:           74 bpm. Exam Location:  [facility] Procedure: 2D Echo, Cardiac Doppler and Color Doppler Indications:    Stroke l63.9  History:        Patient  has prior history of Echocardiogram examinations, most                 recent 02/11/2015. Risk Factors:Non-Smoker, Dyslipidemia and                 Hypertension.  Sonographer:    Alvino Chapel RCS Referring Phys: [id number] OLADAPO ADEFESO IMPRESSIONS  1. Left ventricular ejection fraction, by estimation, is 65 to 70%. The left ventricle has normal function. The left ventricle has no regional wall motion  abnormalities. There is moderate left ventricular hypertrophy. Left ventricular diastolic parameters are indeterminate.  2. Right ventricular systolic function is normal. The right ventricular size is normal. Tricuspid regurgitation signal is inadequate for assessing PA pressure.  3. The mitral valve is grossly normal. No evidence of mitral valve regurgitation.  4. The aortic valve is tricuspid. Aortic valve regurgitation is not visualized.  5. The inferior vena cava is normal in size with greater than 50% respiratory variability, suggesting right atrial pressure of 3 mmHg. FINDINGS  Left Ventricle: Left ventricular ejection fraction, by estimation, is 65 to 70%. The left ventricle has normal function. The left ventricle has no regional wall motion abnormalities. The left ventricular internal cavity size was normal in size. There is  moderate left ventricular hypertrophy. Left ventricular diastolic parameters are indeterminate. Right Ventricle: The right ventricular size is normal. No increase in right ventricular wall thickness. Right ventricular systolic function is normal. Tricuspid regurgitation signal is inadequate for assessing PA pressure. Left Atrium: Left atrial size was normal in size. Right Atrium: Right atrial size was normal in size. Pericardium: There is no evidence of pericardial effusion. Mitral Valve: The mitral valve is grossly normal. No evidence of mitral valve regurgitation. Tricuspid Valve: The tricuspid valve is grossly normal. Tricuspid valve regurgitation is trivial. Aortic Valve: The aortic valve is tricuspid. Aortic valve regurgitation is not visualized. Pulmonic Valve: The pulmonic valve was grossly normal. Pulmonic valve regurgitation is not visualized. Aorta: The aortic root is normal in size and structure. Venous: The inferior vena cava is normal in size with greater than 50% respiratory variability, suggesting right atrial pressure of 3 mmHg. IAS/Shunts: No atrial level shunt detected  by color flow Doppler.  LEFT VENTRICLE PLAX 2D LVIDd:         4.40 cm  Diastology LVIDs:         2.70 cm  LV e' medial:    5.55 cm/s LV PW:         1.20 cm  LV E/e' medial:  12.7 LV IVS:        1.30 cm  LV e' lateral:   7.07 cm/s LVOT diam:     2.00 cm  LV E/e' lateral: 10.0 LV SV:         64 LV SV Index:   33 LVOT Area:     3.14 cm  RIGHT VENTRICLE RV S prime:     20.70 cm/s TAPSE (M-mode): 2.0 cm LEFT ATRIUM             Index       RIGHT ATRIUM           Index LA diam:        3.60 cm 1.88 cm/m  RA Area:     13.50 cm LA Vol (A2C):   63.0 ml 32.95 ml/m RA Volume:   31.00 ml  16.21 ml/m LA Vol (A4C):   40.2 ml 21.02 ml/m LA Biplane Vol: 51.1 ml 26.72 ml/m  AORTIC VALVE  LVOT Vmax:   105.00 cm/s LVOT Vmean:  67.400 cm/s LVOT VTI:    0.203 m  AORTA Ao Root diam: 3.10 cm MITRAL VALVE MV Area (PHT): 3.85 cm    SHUNTS MV Decel Time: 197 msec    Systemic VTI:  0.20 m MV E velocity: 70.60 cm/s  Systemic Diam: 2.00 cm MV A velocity: 93.60 cm/s MV E/A ratio:  0.75 Rozann Lesches MD Electronically signed by Rozann Lesches MD Signature Date/Time: 09/16/2020/5:01:24 PM    Final    CT ANGIO HEAD CODE STROKE  Result Date: 09/16/2020 CLINICAL DATA:  Headache and confusion EXAM: CT ANGIOGRAPHY HEAD AND NECK CT PERFUSION BRAIN TECHNIQUE: Multidetector CT imaging of the head and neck was performed using the standard protocol during bolus administration of intravenous contrast. Multiplanar CT image reconstructions and MIPs were obtained to evaluate the vascular anatomy. Carotid stenosis measurements (when applicable) are obtained utilizing NASCET criteria, using the distal internal carotid diameter as the denominator. Multiphase CT imaging of the brain was performed following IV bolus contrast injection. Subsequent parametric perfusion maps were calculated using RAPID software. CONTRAST:  132mL OMNIPAQUE IOHEXOL 350 MG/ML SOLN COMPARISON:  None. FINDINGS: CTA NECK FINDINGS SKELETON: There is no bony spinal canal stenosis. No  lytic or blastic lesion. OTHER NECK: Normal pharynx, larynx and major salivary glands. No cervical lymphadenopathy. Unremarkable thyroid gland. UPPER CHEST: No pneumothorax or pleural effusion. No nodules or masses. AORTIC ARCH: There is no calcific atherosclerosis of the aortic arch. There is no aneurysm, dissection or hemodynamically significant stenosis of the visualized portion of the aorta. Conventional 3 vessel aortic branching pattern. The visualized proximal subclavian arteries are widely patent. RIGHT CAROTID SYSTEM: Normal without aneurysm, dissection or stenosis. LEFT CAROTID SYSTEM: Normal without aneurysm, dissection or stenosis. VERTEBRAL ARTERIES: Left dominant configuration. Both origins are clearly patent. There is no dissection, occlusion or flow-limiting stenosis to the skull base (V1-V3 segments). CTA HEAD FINDINGS POSTERIOR CIRCULATION: --Vertebral arteries: Normal V4 segments. --Inferior cerebellar arteries: Normal. --Basilar artery: Normal. --Superior cerebellar arteries: Normal. --Posterior cerebral arteries (PCA): Normal. ANTERIOR CIRCULATION: --Intracranial internal carotid arteries: Normal. --Anterior cerebral arteries (ACA): Normal. Both A1 segments are present. Patent anterior communicating artery (a-comm). --Middle cerebral arteries (MCA): Short segment occlusion of the inferior division left MCA M2 segment (series 10, image 16). The more distal portion is normal. Right MCA is normal. VENOUS SINUSES: As permitted by contrast timing, patent. ANATOMIC VARIANTS: None Review of the MIP images confirms the above findings. CT Brain Perfusion Findings: ASPECTS: 7 CBF (<30%) Volume: 74mL Perfusion (Tmax>6.0s) volume: 78mL Mismatch Volume: 40mL Infarction Location:None by CT perfusion criteria. IMPRESSION: 1. Short segment occlusion of the inferior division left MCA M2 segment. No other intracranial arterial occlusion or high-grade stenosis. 2. Normal CTA of the neck. 3. Normal CT perfusion study.  These results were called by telephone at the time of interpretation on 09/16/2020 at 4:01 am to provider [facility] , who verbally acknowledged these results. Electronically Signed   By: Ulyses Jarred M.D.   On: 09/16/2020 04:02   CT ANGIO NECK CODE STROKE  Result Date: 09/16/2020 CLINICAL DATA:  Headache and confusion EXAM: CT ANGIOGRAPHY HEAD AND NECK CT PERFUSION BRAIN TECHNIQUE: Multidetector CT imaging of the head and neck was performed using the standard protocol during bolus administration of intravenous contrast. Multiplanar CT image reconstructions and MIPs were obtained to evaluate the vascular anatomy. Carotid stenosis measurements (when applicable) are obtained utilizing NASCET criteria, using the distal internal carotid diameter as the denominator. Multiphase CT  imaging of the brain was performed following IV bolus contrast injection. Subsequent parametric perfusion maps were calculated using RAPID software. CONTRAST:  111mL OMNIPAQUE IOHEXOL 350 MG/ML SOLN COMPARISON:  None. FINDINGS: CTA NECK FINDINGS SKELETON: There is no bony spinal canal stenosis. No lytic or blastic lesion. OTHER NECK: Normal pharynx, larynx and major salivary glands. No cervical lymphadenopathy. Unremarkable thyroid gland. UPPER CHEST: No pneumothorax or pleural effusion. No nodules or masses. AORTIC ARCH: There is no calcific atherosclerosis of the aortic arch. There is no aneurysm, dissection or hemodynamically significant stenosis of the visualized portion of the aorta. Conventional 3 vessel aortic branching pattern. The visualized proximal subclavian arteries are widely patent. RIGHT CAROTID SYSTEM: Normal without aneurysm, dissection or stenosis. LEFT CAROTID SYSTEM: Normal without aneurysm, dissection or stenosis. VERTEBRAL ARTERIES: Left dominant configuration. Both origins are clearly patent. There is no dissection, occlusion or flow-limiting stenosis to the skull base (V1-V3 segments). CTA HEAD FINDINGS POSTERIOR  CIRCULATION: --Vertebral arteries: Normal V4 segments. --Inferior cerebellar arteries: Normal. --Basilar artery: Normal. --Superior cerebellar arteries: Normal. --Posterior cerebral arteries (PCA): Normal. ANTERIOR CIRCULATION: --Intracranial internal carotid arteries: Normal. --Anterior cerebral arteries (ACA): Normal. Both A1 segments are present. Patent anterior communicating artery (a-comm). --Middle cerebral arteries (MCA): Short segment occlusion of the inferior division left MCA M2 segment (series 10, image 16). The more distal portion is normal. Right MCA is normal. VENOUS SINUSES: As permitted by contrast timing, patent. ANATOMIC VARIANTS: None Review of the MIP images confirms the above findings. CT Brain Perfusion Findings: ASPECTS: 7 CBF (<30%) Volume: 28mL Perfusion (Tmax>6.0s) volume: 59mL Mismatch Volume: 38mL Infarction Location:None by CT perfusion criteria. IMPRESSION: 1. Short segment occlusion of the inferior division left MCA M2 segment. No other intracranial arterial occlusion or high-grade stenosis. 2. Normal CTA of the neck. 3. Normal CT perfusion study. These results were called by telephone at the time of interpretation on 09/16/2020 at 4:01 am to provider [facility] , who verbally acknowledged these results. Electronically Signed   By: Ulyses Jarred M.D.   On: 09/16/2020 04:02        Discharge Exam: Vitals:   09/17/20 0448 09/17/20 0700  BP: (!) 149/86 (!) 145/76  Pulse: 76 79  Resp: 20 20  Temp: (!) 97.2 F (36.2 C) 97.8 F (36.6 C)  SpO2: 98% 99%   Vitals:   09/16/20 2300 09/17/20 0130 09/17/20 0448 09/17/20 0700  BP: (!) 165/80 (!) 155/80 (!) 149/86 (!) 145/76  Pulse: 79 70 76 79  Resp: 18 18 20 20   Temp: 98.1 F (36.7 C) 98.1 F (36.7 C) (!) 97.2 F (36.2 C) 97.8 F (36.6 C)  TempSrc:  Oral    SpO2: 99% 100% 98% 99%  Weight:      Height:        General: Pt is alert, awake, not in acute distress Cardiovascular: RRR, S1/S2 +, no rubs, no  gallops Respiratory: CTA bilaterally, no wheezing, no rhonchi Abdominal: Soft, NT, ND, bowel sounds + Extremities: no edema, no cyanosis   The results of significant diagnostics from this hospitalization (including imaging, microbiology, ancillary and laboratory) are listed below for reference.    Significant Diagnostic Studies: CT HEAD WO CONTRAST  Result Date: 09/15/2020 CLINICAL DATA:  Confusion, headache, last known well 2 days ago EXAM: CT HEAD WITHOUT CONTRAST TECHNIQUE: Contiguous axial images were obtained from the base of the skull through the vertex without intravenous contrast. COMPARISON:  None. FINDINGS: Brain: Hypodensities are seen within the left temporal and parietal regions consistent with acute  to subacute distal left MCA territory infarcts. Largest hypodensity in the left temporal lobe measures 2.8 cm. No mass effect. No evidence of acute hemorrhage. Focal hypodensity left basal ganglia consistent with chronic lacunar infarct. The lateral ventricles and remaining midline structures are unremarkable. No acute extra-axial fluid collections. No mass effect. Vascular: No hyperdense vessel or unexpected calcification. Skull: Normal. Negative for fracture or focal lesion. Sinuses/Orbits: No acute finding. Other: None. IMPRESSION: 1. Acute to subacute left MCA territory infarcts. No mass effect or hemorrhage. Critical Value/emergent results were called by telephone at the time of interpretation on 09/15/2020 at 11:27 pm to provider [facility] , who verbally acknowledged these results. Electronically Signed   By: Randa Ngo M.D.   On: 09/15/2020 23:27   MR ANGIO HEAD WO CONTRAST  Result Date: 09/16/2020 CLINICAL DATA:  Left MCA stroke with M2 MCA occlusion on CTA EXAM: MRA HEAD WITHOUT CONTRAST TECHNIQUE: Angiographic images of the Circle of Willis were obtained using MRA technique without intravenous contrast. COMPARISON:  Earlier same day FINDINGS: Motion artifact is present. Intracranial  internal carotid arteries are patent. Right middle and both anterior cerebral arteries are patent. Left M1 MCA is patent. As seen on the CTA, there is short segment occlusion or high-grade stenosis of a proximal inferiorly directed branch of the left M1 MCA with reconstitution. Intracranial vertebral arteries, basilar artery, posterior cerebral arteries are patent. There is no aneurysm. IMPRESSION: Unchanged short segment occlusion with reconstitution or high-grade stenosis of proximal inferiorly directed branch of the left M1 MCA. No new vascular findings. Electronically Signed   By: Macy Mis M.D.   On: 09/16/2020 17:00   MR BRAIN WO CONTRAST  Result Date: 09/16/2020 CLINICAL DATA:  Neuro deficit, acute, stroke suspected. EXAM: MRI HEAD WITHOUT CONTRAST TECHNIQUE: Multiplanar, multiecho pulse sequences of the brain and surrounding structures were obtained without intravenous contrast. COMPARISON:  Head CT Sep 15, 2020. FINDINGS: Brain: Area of restricted diffusion involving the left temporoparietal region, consistent with acute/subacute infarct, corresponding to hypodensity seen on prior head CT. No hemorrhage, hydrocephalus, mass or midline shift. Remote infarcts within the pons, left caudate and bilateral corona radiata. Scattered foci of T2 hyperintense within the white matter of the cerebral hemispheres, nonspecific, most likely related to chronic microangiopathy. Vascular: Major arterial flow voids maintained. Skull and upper cervical spine: Normal marrow signal. Sinuses/Orbits: Negative. Other: None. IMPRESSION: 1. 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 2. 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 3. 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 Electronically Signed   By: Pedro Earls M.D.   On: 09/16/2020 10:37   CT CEREBRAL PERFUSION W  CONTRAST  Result Date: 09/16/2020 CLINICAL DATA:  Headache and confusion EXAM: CT ANGIOGRAPHY HEAD AND NECK CT PERFUSION BRAIN TECHNIQUE: Multidetector CT imaging of the head and neck was performed using the standard protocol during bolus administration of intravenous contrast. Multiplanar CT image reconstructions and MIPs were obtained to evaluate the vascular anatomy. Carotid stenosis measurements (when applicable) are obtained utilizing NASCET criteria, using the distal internal carotid diameter as the denominator. Multiphase CT imaging of the brain was performed following IV bolus contrast injection. Subsequent parametric perfusion maps were calculated using RAPID software. CONTRAST:  167mL OMNIPAQUE IOHEXOL 350 MG/ML SOLN COMPARISON:  None. FINDINGS: CTA NECK FINDINGS SKELETON: There is no bony spinal canal stenosis. No lytic or blastic lesion. OTHER NECK: Normal pharynx, larynx and  major salivary glands. No cervical lymphadenopathy. Unremarkable thyroid gland. UPPER CHEST: No pneumothorax or pleural effusion. No nodules or masses. AORTIC ARCH: There is no calcific atherosclerosis of the aortic arch. There is no aneurysm, dissection or hemodynamically significant stenosis of the visualized portion of the aorta. Conventional 3 vessel aortic branching pattern. The visualized proximal subclavian arteries are widely patent. RIGHT CAROTID SYSTEM: Normal without aneurysm, dissection or stenosis. LEFT CAROTID SYSTEM: Normal without aneurysm, dissection or stenosis. VERTEBRAL ARTERIES: Left dominant configuration. Both origins are clearly patent. There is no dissection, occlusion or flow-limiting stenosis to the skull base (V1-V3 segments). CTA HEAD FINDINGS POSTERIOR CIRCULATION: --Vertebral arteries: Normal V4 segments. --Inferior cerebellar arteries: Normal. --Basilar artery: Normal. --Superior cerebellar arteries: Normal. --Posterior cerebral arteries (PCA): Normal. ANTERIOR CIRCULATION: --Intracranial internal  carotid arteries: Normal. --Anterior cerebral arteries (ACA): Normal. Both A1 segments are present. Patent anterior communicating artery (a-comm). --Middle cerebral arteries (MCA): Short segment occlusion of the inferior division left MCA M2 segment (series 10, image 16). The more distal portion is normal. Right MCA is normal. VENOUS SINUSES: As permitted by contrast timing, patent. ANATOMIC VARIANTS: None Review of the MIP images confirms the above findings. CT Brain Perfusion Findings: ASPECTS: 7 CBF (<30%) Volume: 55mL Perfusion (Tmax>6.0s) volume: 38mL Mismatch Volume: 57mL Infarction Location:None by CT perfusion criteria. IMPRESSION: 1. Short segment occlusion of the inferior division left MCA M2 segment. No other intracranial arterial occlusion or high-grade stenosis. 2. Normal CTA of the neck. 3. Normal CT perfusion study. These results were called by telephone at the time of interpretation on 09/16/2020 at 4:01 am to provider [facility] , who verbally acknowledged these results. Electronically Signed   By: Ulyses Jarred M.D.   On: 09/16/2020 04:02   ECHOCARDIOGRAM COMPLETE  Result Date: 09/16/2020    ECHOCARDIOGRAM REPORT   Patient Name:   Erika Ruiz Date of Exam: 09/16/2020 Medical Rec #:  [id number]        Height:       66.0 in Accession #:    [id number]       Weight:       180.0 lb Date of Birth:  1965-08-22       BSA:          1.912 m Patient Age:    33 years         BP:           155/93 mmHg Patient Gender: F                HR:           74 bpm. Exam Location:  [facility] Procedure: 2D Echo, Cardiac Doppler and Color Doppler Indications:    Stroke l63.9  History:        Patient has prior history of Echocardiogram examinations, most                 recent 02/11/2015. Risk Factors:Non-Smoker, Dyslipidemia and                 Hypertension.  Sonographer:    Alvino Chapel RCS Referring Phys: [id number] OLADAPO ADEFESO IMPRESSIONS  1. Left ventricular ejection fraction, by estimation, is 65 to 70%. The left  ventricle has normal function. The left ventricle has no regional wall motion abnormalities. There is moderate left ventricular hypertrophy. Left ventricular diastolic parameters are indeterminate.  2. Right ventricular systolic function is normal. The right ventricular size is normal. Tricuspid regurgitation signal is inadequate for assessing PA pressure.  3.  The mitral valve is grossly normal. No evidence of mitral valve regurgitation.  4. The aortic valve is tricuspid. Aortic valve regurgitation is not visualized.  5. The inferior vena cava is normal in size with greater than 50% respiratory variability, suggesting right atrial pressure of 3 mmHg. FINDINGS  Left Ventricle: Left ventricular ejection fraction, by estimation, is 65 to 70%. The left ventricle has normal function. The left ventricle has no regional wall motion abnormalities. The left ventricular internal cavity size was normal in size. There is  moderate left ventricular hypertrophy. Left ventricular diastolic parameters are indeterminate. Right Ventricle: The right ventricular size is normal. No increase in right ventricular wall thickness. Right ventricular systolic function is normal. Tricuspid regurgitation signal is inadequate for assessing PA pressure. Left Atrium: Left atrial size was normal in size. Right Atrium: Right atrial size was normal in size. Pericardium: There is no evidence of pericardial effusion. Mitral Valve: The mitral valve is grossly normal. No evidence of mitral valve regurgitation. Tricuspid Valve: The tricuspid valve is grossly normal. Tricuspid valve regurgitation is trivial. Aortic Valve: The aortic valve is tricuspid. Aortic valve regurgitation is not visualized. Pulmonic Valve: The pulmonic valve was grossly normal. Pulmonic valve regurgitation is not visualized. Aorta: The aortic root is normal in size and structure. Venous: The inferior vena cava is normal in size with greater than 50% respiratory variability,  suggesting right atrial pressure of 3 mmHg. IAS/Shunts: No atrial level shunt detected by color flow Doppler.  LEFT VENTRICLE PLAX 2D LVIDd:         4.40 cm  Diastology LVIDs:         2.70 cm  LV e' medial:    5.55 cm/s LV PW:         1.20 cm  LV E/e' medial:  12.7 LV IVS:        1.30 cm  LV e' lateral:   7.07 cm/s LVOT diam:     2.00 cm  LV E/e' lateral: 10.0 LV SV:         64 LV SV Index:   33 LVOT Area:     3.14 cm  RIGHT VENTRICLE RV S prime:     20.70 cm/s TAPSE (M-mode): 2.0 cm LEFT ATRIUM             Index       RIGHT ATRIUM           Index LA diam:        3.60 cm 1.88 cm/m  RA Area:     13.50 cm LA Vol (A2C):   63.0 ml 32.95 ml/m RA Volume:   31.00 ml  16.21 ml/m LA Vol (A4C):   40.2 ml 21.02 ml/m LA Biplane Vol: 51.1 ml 26.72 ml/m  AORTIC VALVE LVOT Vmax:   105.00 cm/s LVOT Vmean:  67.400 cm/s LVOT VTI:    0.203 m  AORTA Ao Root diam: 3.10 cm MITRAL VALVE MV Area (PHT): 3.85 cm    SHUNTS MV Decel Time: 197 msec    Systemic VTI:  0.20 m MV E velocity: 70.60 cm/s  Systemic Diam: 2.00 cm MV A velocity: 93.60 cm/s MV E/A ratio:  0.75 Rozann Lesches MD Electronically signed by Rozann Lesches MD Signature Date/Time: 09/16/2020/5:01:24 PM    Final    CT ANGIO HEAD CODE STROKE  Result Date: 09/16/2020 CLINICAL DATA:  Headache and confusion EXAM: CT ANGIOGRAPHY HEAD AND NECK CT PERFUSION BRAIN TECHNIQUE: Multidetector CT imaging of the head and neck was performed using  the standard protocol during bolus administration of intravenous contrast. Multiplanar CT image reconstructions and MIPs were obtained to evaluate the vascular anatomy. Carotid stenosis measurements (when applicable) are obtained utilizing NASCET criteria, using the distal internal carotid diameter as the denominator. Multiphase CT imaging of the brain was performed following IV bolus contrast injection. Subsequent parametric perfusion maps were calculated using RAPID software. CONTRAST:  152mL OMNIPAQUE IOHEXOL 350 MG/ML SOLN COMPARISON:   None. FINDINGS: CTA NECK FINDINGS SKELETON: There is no bony spinal canal stenosis. No lytic or blastic lesion. OTHER NECK: Normal pharynx, larynx and major salivary glands. No cervical lymphadenopathy. Unremarkable thyroid gland. UPPER CHEST: No pneumothorax or pleural effusion. No nodules or masses. AORTIC ARCH: There is no calcific atherosclerosis of the aortic arch. There is no aneurysm, dissection or hemodynamically significant stenosis of the visualized portion of the aorta. Conventional 3 vessel aortic branching pattern. The visualized proximal subclavian arteries are widely patent. RIGHT CAROTID SYSTEM: Normal without aneurysm, dissection or stenosis. LEFT CAROTID SYSTEM: Normal without aneurysm, dissection or stenosis. VERTEBRAL ARTERIES: Left dominant configuration. Both origins are clearly patent. There is no dissection, occlusion or flow-limiting stenosis to the skull base (V1-V3 segments). CTA HEAD FINDINGS POSTERIOR CIRCULATION: --Vertebral arteries: Normal V4 segments. --Inferior cerebellar arteries: Normal. --Basilar artery: Normal. --Superior cerebellar arteries: Normal. --Posterior cerebral arteries (PCA): Normal. ANTERIOR CIRCULATION: --Intracranial internal carotid arteries: Normal. --Anterior cerebral arteries (ACA): Normal. Both A1 segments are present. Patent anterior communicating artery (a-comm). --Middle cerebral arteries (MCA): Short segment occlusion of the inferior division left MCA M2 segment (series 10, image 16). The more distal portion is normal. Right MCA is normal. VENOUS SINUSES: As permitted by contrast timing, patent. ANATOMIC VARIANTS: None Review of the MIP images confirms the above findings. CT Brain Perfusion Findings: ASPECTS: 7 CBF (<30%) Volume: 3mL Perfusion (Tmax>6.0s) volume: 63mL Mismatch Volume: 58mL Infarction Location:None by CT perfusion criteria. IMPRESSION: 1. Short segment occlusion of the inferior division left MCA M2 segment. No other intracranial arterial  occlusion or high-grade stenosis. 2. Normal CTA of the neck. 3. Normal CT perfusion study. These results were called by telephone at the time of interpretation on 09/16/2020 at 4:01 am to provider [facility] , who verbally acknowledged these results. Electronically Signed   By: Ulyses Jarred M.D.   On: 09/16/2020 04:02   CT ANGIO NECK CODE STROKE  Result Date: 09/16/2020 CLINICAL DATA:  Headache and confusion EXAM: CT ANGIOGRAPHY HEAD AND NECK CT PERFUSION BRAIN TECHNIQUE: Multidetector CT imaging of the head and neck was performed using the standard protocol during bolus administration of intravenous contrast. Multiplanar CT image reconstructions and MIPs were obtained to evaluate the vascular anatomy. Carotid stenosis measurements (when applicable) are obtained utilizing NASCET criteria, using the distal internal carotid diameter as the denominator. Multiphase CT imaging of the brain was performed following IV bolus contrast injection. Subsequent parametric perfusion maps were calculated using RAPID software. CONTRAST:  189mL OMNIPAQUE IOHEXOL 350 MG/ML SOLN COMPARISON:  None. FINDINGS: CTA NECK FINDINGS SKELETON: There is no bony spinal canal stenosis. No lytic or blastic lesion. OTHER NECK: Normal pharynx, larynx and major salivary glands. No cervical lymphadenopathy. Unremarkable thyroid gland. UPPER CHEST: No pneumothorax or pleural effusion. No nodules or masses. AORTIC ARCH: There is no calcific atherosclerosis of the aortic arch. There is no aneurysm, dissection or hemodynamically significant stenosis of the visualized portion of the aorta. Conventional 3 vessel aortic branching pattern. The visualized proximal subclavian arteries are widely patent. RIGHT CAROTID SYSTEM: Normal without aneurysm, dissection or stenosis. LEFT  CAROTID SYSTEM: Normal without aneurysm, dissection or stenosis. VERTEBRAL ARTERIES: Left dominant configuration. Both origins are clearly patent. There is no dissection, occlusion or  flow-limiting stenosis to the skull base (V1-V3 segments). CTA HEAD FINDINGS POSTERIOR CIRCULATION: --Vertebral arteries: Normal V4 segments. --Inferior cerebellar arteries: Normal. --Basilar artery: Normal. --Superior cerebellar arteries: Normal. --Posterior cerebral arteries (PCA): Normal. ANTERIOR CIRCULATION: --Intracranial internal carotid arteries: Normal. --Anterior cerebral arteries (ACA): Normal. Both A1 segments are present. Patent anterior communicating artery (a-comm). --Middle cerebral arteries (MCA): Short segment occlusion of the inferior division left MCA M2 segment (series 10, image 16). The more distal portion is normal. Right MCA is normal. VENOUS SINUSES: As permitted by contrast timing, patent. ANATOMIC VARIANTS: None Review of the MIP images confirms the above findings. CT Brain Perfusion Findings: ASPECTS: 7 CBF (<30%) Volume: 25mL Perfusion (Tmax>6.0s) volume: 29mL Mismatch Volume: 59mL Infarction Location:None by CT perfusion criteria. IMPRESSION: 1. Short segment occlusion of the inferior division left MCA M2 segment. No other intracranial arterial occlusion or high-grade stenosis. 2. Normal CTA of the neck. 3. Normal CT perfusion study. These results were called by telephone at the time of interpretation on 09/16/2020 at 4:01 am to provider [facility] , who verbally acknowledged these results. Electronically Signed   By: Ulyses Jarred M.D.   On: 09/16/2020 04:02     Microbiology: Recent Results (from the past 240 hour(s))  Resp Panel by RT-PCR (Flu A&B, Covid) Nasopharyngeal Swab     Status: None   Collection Time: 09/16/20 12:21 AM   Specimen: Nasopharyngeal Swab; Nasopharyngeal(NP) swabs in vial transport medium  Result Value Ref Range Status   SARS Coronavirus 2 by RT PCR NEGATIVE NEGATIVE Final    Comment: (NOTE) SARS-CoV-2 target nucleic acids are NOT DETECTED.  The SARS-CoV-2 RNA is generally detectable in upper respiratory specimens during the acute phase of infection.  The lowest concentration of SARS-CoV-2 viral copies this assay can detect is 138 copies/mL. A negative result does not preclude SARS-Cov-2 infection and should not be used as the sole basis for treatment or other patient management decisions. A negative result may occur with  improper specimen collection/handling, submission of specimen other than nasopharyngeal swab, presence of viral mutation(s) within the areas targeted by this assay, and inadequate number of viral copies(<138 copies/mL). A negative result must be combined with clinical observations, patient history, and epidemiological information. The expected result is Negative.  Fact Sheet for Patients:  EntrepreneurPulse.com.au  Fact Sheet for Healthcare Providers:  IncredibleEmployment.be  This test is no t yet approved or cleared by the [place] FDA and  has been authorized for detection and/or diagnosis of SARS-CoV-2 by FDA under an Emergency Use Authorization (EUA). This EUA will remain  in effect (meaning this test can be used) for the duration of the COVID-19 declaration under Section 564(b)(1) of the Act, 21 U.S.C.section 360bbb-3(b)(1), unless the authorization is terminated  or revoked sooner.       Influenza A by PCR NEGATIVE NEGATIVE Final   Influenza B by PCR NEGATIVE NEGATIVE Final    Comment: (NOTE) The Xpert Xpress SARS-CoV-2/FLU/RSV plus assay is intended as an aid in the diagnosis of influenza from Nasopharyngeal swab specimens and should not be used as a sole basis for treatment. Nasal washings and aspirates are unacceptable for Xpert Xpress SARS-CoV-2/FLU/RSV testing.  Fact Sheet for Patients: EntrepreneurPulse.com.au  Fact Sheet for Healthcare Providers: IncredibleEmployment.be  This test is not yet approved or cleared by the [place] FDA and has been authorized for detection and/or  diagnosis of SARS-CoV-2 by FDA  under an Emergency Use Authorization (EUA). This EUA will remain in effect (meaning this test can be used) for the duration of the COVID-19 declaration under Section 564(b)(1) of the Act, 21 U.S.C. section 360bbb-3(b)(1), unless the authorization is terminated or revoked.  Performed at [facility], [address], [place], [place] [address]      Labs: Basic Metabolic Panel: Recent Labs  Lab 09/15/20 2232  NA 137  K 4.1  CL 101  CO2 29  GLUCOSE 124*  BUN 13  CREATININE 0.83  CALCIUM 9.3   Liver Function Tests: Recent Labs  Lab 09/15/20 2232  AST 19  ALT 30  ALKPHOS 109  BILITOT 0.6  PROT 7.8  ALBUMIN 4.2   No results for input(s): LIPASE, AMYLASE in the last 168 hours. No results for input(s): AMMONIA in the last 168 hours. CBC: Recent Labs  Lab 09/15/20 2232  WBC 8.4  NEUTROABS 4.8  HGB 14.5  HCT 43.1  MCV 90.7  PLT 318   Cardiac Enzymes: No results for input(s): CKTOTAL, CKMB, CKMBINDEX, TROPONINI in the last 168 hours. BNP: Invalid input(s): POCBNP CBG: No results for input(s): GLUCAP in the last 168 hours.  Time coordinating discharge:  36 minutes  Signed:  Orson Eva, DO [facility] Pager: [phone] 09/17/2020, 11:05 AM

## 2020-09-17 NOTE — TOC Transition Note (Signed)
Transition of Care [facility]) - CM/SW Discharge Note   Patient Details  Name: Erika Ruiz MRN: [id number] Date of Birth: [birth year]  [facility] CM/SW Contact:  Natasha Bence, LCSW Phone Number: 09/17/2020, 12:29 PM   Clinical Narrative:    CSW notified of patient's readiness for discharge and need for OP SLP. Patient agreeable to referral. CSW referred patient to OP SLP with [facility]. TOC signing off.   Final next level of care: OP Rehab Barriers to Discharge: Barriers Resolved   Patient Goals and CMS Choice Patient states their goals for this hospitalization and ongoing recovery are:: Return home with out patient SLP CMS Medicare.gov Compare Post Acute Care list provided to:: Patient Choice offered to / list presented to : Patient  Discharge Placement                    Patient and family notified of of transfer: 09/17/20  Discharge Plan and Services                DME Arranged: N/A DME Agency: NA       HH Arranged: NA HH Agency: NA        Social Determinants of Health ([place]) Interventions     Readmission Risk Interventions No flowsheet data found.

## 2020-09-17 NOTE — Telephone Encounter (Signed)
Received call from Dr.Tat requesting 30 day event monitor for CVA.patient going home today. Enrolled patient in [employer], will be sent UPS to patients home.

## 2020-09-17 NOTE — Plan of Care (Signed)
  Problem: Education: Goal: Knowledge of disease or condition will improve Outcome: Progressing Goal: Knowledge of secondary prevention will improve Outcome: Progressing Goal: Knowledge of patient specific risk factors addressed and post discharge goals established will improve Outcome: Progressing   Problem: Coping: Goal: Will verbalize positive feelings about self Outcome: Progressing   

## 2020-09-17 NOTE — Evaluation (Signed)
Speech Language Pathology Evaluation Patient Details Name: Erika Ruiz MRN: [id number] DOB: Jul 20, 1965 Today's Date: 09/17/2020 Time: 4098-1191 SLP Time Calculation (min) (ACUTE ONLY): 31.63 min  Problem List:  Patient Active Problem List   Diagnosis Date Noted  . Acute ischemic stroke ([place]) 09/16/2020  . Essential hypertension 09/16/2020  . Chronic back pain 09/16/2020  . GERD (gastroesophageal reflux disease) 09/16/2020  . Hyperlipidemia 09/16/2020  . Dyspepsia   . Family history of colon cancer 02/14/2017  . Abdominal pain 02/14/2017  . Abdominal pain, epigastric 07/11/2012  . Abdominal pain, left upper quadrant 07/11/2012  . Family history of malignant neoplasm of gastrointestinal tract 07/11/2012   Past Medical History:  Past Medical History:  Diagnosis Date  . Hyperlipemia   . Hypertension   . Uterine cancer ([place]) 1987  . Vitamin D deficiency disease    Past Surgical History:  Past Surgical History:  Procedure Laterality Date  . ABDOMINAL HYSTERECTOMY     due to CA  . BIOPSY  03/13/2017   Procedure: BIOPSY;  Surgeon: Danie Binder, MD;  [facility];  Service: Endoscopy;;  gastric  . BREAST LUMPECTOMY Left   . COLONOSCOPY WITH PROPOFOL N/A 03/13/2017   Procedure: COLONOSCOPY WITH PROPOFOL;  Surgeon: Danie Binder, MD;  [facility];  Service: Endoscopy;  Laterality: N/A;  11:00am  . ESOPHAGOGASTRODUODENOSCOPY (EGD) WITH PROPOFOL N/A 03/13/2017   Procedure: ESOPHAGOGASTRODUODENOSCOPY (EGD) WITH PROPOFOL;  Surgeon: Danie Binder, MD;  [facility];  Service: Endoscopy;  Laterality: N/A;  . LEFT OOPHORECTOMY    . POLYPECTOMY  03/13/2017   Procedure: POLYPECTOMY;  Surgeon: Danie Binder, MD;  [facility];  Service: Endoscopy;;  colon  . VAGINAL DELIVERY     X2   HPI:  The patient is a 55 year old right-handed white female who presents with 4 day history of a moderate to severe left temporal frontal headaches.  She reports not being headache person at baseline. She developed difficulty speaking but 2 days ago which resulted the patient is seeking medical attention. Neurology consult reveals "Alert and oriented.  Bedside naming shows subtle difficulties with word-finding difficulties. Fluency is reduced. Comprehensive and repetition are intact." CT head without contrast showed acute to subacute left MCA territory infarcts. MRI reveals "Unchanged short segment occlusion with reconstitution or high-grade stenosis of proximal inferiorly directed branch of the left M1 MCA." SLE requested   Assessment / Plan / Recommendation Clinical Impression  Pt presents with moderate expressive aphasia with noted semantic paraphasias, afluent speech that is halting with significant pauses, and simple language use often ending sentences with "i don't know what to say or how to say it". Further note very mild dysarthria with slightly decreased artciulatory precision. Responsive naming at 60% accuracy, confrontational naming at 80% accuracy (this is where semantic paraphasias were largely noted), significant difficulty with open ended questions. Pt was unable to describe her work environment and her daily job despite max cues and ample time provided. 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 Pt is very stimulable for strategies and SLP reviewed strategies including narration of topic/sentence/idea she is trying to articulate, acting out words, and at length discussion of "talking around" things to facilitate coming up with the correct word. Pt will benefit from Outpatient Speech Therapy to provide more in depth evaluation and treatment as indicated. ST will  continue to follow acutely, thank you    SLP Assessment  SLP Recommendation/Assessment: Patient needs continued Speech  [facility] SLP Visit Diagnosis: Aphasia (R47.01)    Follow Up Recommendations   Outpatient SLP   Frequency and Duration min 1 x/week  1 week      SLP Evaluation Cognition  Overall Cognitive Status: Within Functional Limits for tasks assessed       Comprehension  Auditory Comprehension Overall Auditory Comprehension: Appears within functional limits for tasks assessed Yes/No Questions: Within Functional Limits Commands: Within Functional Limits Reading Comprehension Reading Status: Within funtional limits    Expression Expression Primary Mode of Expression: Verbal Verbal Expression Overall Verbal Expression: Impaired Initiation: Impaired Repetition: No impairment Naming: Impairment Responsive: 26-50% accurate Confrontation: Impaired Convergent: 50-74% accurate Divergent: 25-49% accurate Verbal Errors: Semantic paraphasias;Aware of errors Pragmatics: No impairment Written Expression Dominant Hand: Right Written Expression: Within Functional Limits   Oral / Motor  Oral Motor/Sensory Function Overall Oral Motor/Sensory Function: Within functional limits Motor Speech Overall Motor Speech: Appears within functional limits for tasks assessed     Brekyn Huntoon H. Roddie Mc, [place] Speech Language Pathologist         Wende Bushy 09/17/2020, 12:08 PM

## 2020-09-22 ENCOUNTER — Ambulatory Visit (HOSPITAL_COMMUNITY): Payer: Commercial Managed Care - PPO | Admitting: Speech Pathology

## 2020-09-22 DIAGNOSIS — I639 Cerebral infarction, unspecified: Secondary | ICD-10-CM | POA: Diagnosis not present

## 2020-09-23 ENCOUNTER — Other Ambulatory Visit: Payer: Self-pay | Admitting: *Deleted

## 2020-09-23 ENCOUNTER — Other Ambulatory Visit: Payer: Self-pay

## 2020-09-23 ENCOUNTER — Ambulatory Visit (INDEPENDENT_AMBULATORY_CARE_PROVIDER_SITE_OTHER): Payer: Commercial Managed Care - PPO

## 2020-09-23 DIAGNOSIS — I639 Cerebral infarction, unspecified: Secondary | ICD-10-CM

## 2020-09-27 ENCOUNTER — Encounter (HOSPITAL_COMMUNITY): Payer: Self-pay | Admitting: Speech Pathology

## 2020-09-27 ENCOUNTER — Other Ambulatory Visit: Payer: Self-pay

## 2020-09-27 ENCOUNTER — Ambulatory Visit (HOSPITAL_COMMUNITY): Payer: Commercial Managed Care - PPO | Attending: Internal Medicine | Admitting: Speech Pathology

## 2020-09-27 DIAGNOSIS — R41841 Cognitive communication deficit: Secondary | ICD-10-CM | POA: Diagnosis present

## 2020-09-27 DIAGNOSIS — R4701 Aphasia: Secondary | ICD-10-CM

## 2020-09-27 NOTE — Therapy (Signed)
[place], [place], [address] Phone: [phone]   Fax:  [phone]  Speech Language Pathology Evaluation  Patient Details  Name: Erika Ruiz MRN: [id number] Date of Birth: 12-12-1965 Referring Provider (SLP): Orson Eva, MD   Encounter Date: 09/27/2020   End of Session - 09/27/20 1219    Visit Number 1    Number of Visits 9    Date for SLP Re-Evaluation 11/01/20    Authorization Type UHC/UMR PPO   $50 copay, 25th visit review   SLP Start Time 0900    SLP Stop Time  0945    SLP Time Calculation (min) 45 min    Activity Tolerance Patient tolerated treatment well           Past Medical History:  Diagnosis Date  . Hyperlipemia   . Hypertension   . Uterine cancer ([place]) 1987  . Vitamin D deficiency disease     Past Surgical History:  Procedure Laterality Date  . ABDOMINAL HYSTERECTOMY     due to CA  . BIOPSY  03/13/2017   Procedure: BIOPSY;  Surgeon: Danie Binder, MD;  [facility];  Service: Endoscopy;;  gastric  . BREAST LUMPECTOMY Left   . COLONOSCOPY WITH PROPOFOL N/A 03/13/2017   Procedure: COLONOSCOPY WITH PROPOFOL;  Surgeon: Danie Binder, MD;  [facility];  Service: Endoscopy;  Laterality: N/A;  11:00am  . ESOPHAGOGASTRODUODENOSCOPY (EGD) WITH PROPOFOL N/A 03/13/2017   Procedure: ESOPHAGOGASTRODUODENOSCOPY (EGD) WITH PROPOFOL;  Surgeon: Danie Binder, MD;  [facility];  Service: Endoscopy;  Laterality: N/A;  . LEFT OOPHORECTOMY    . POLYPECTOMY  03/13/2017   Procedure: POLYPECTOMY;  Surgeon: Danie Binder, MD;  [facility];  Service: Endoscopy;;  colon  . VAGINAL DELIVERY     X2    There were no vitals filed for this visit.   Subjective Assessment - 09/27/20 1029    Subjective "learn and talk"    Special Tests MS Aphasia screening test (MAST)    Currently in Pain? No/denies              SLP Evaluation [facility] - 09/27/20 1029       SLP Visit Information   SLP Received On 09/27/20    Referring Provider (SLP) Orson Eva, MD    Onset Date 09/15/2020    Medical Diagnosis CVA      Subjective   Patient/Family Stated Goal "learn and talk"      General Information   HPI 55 year old female with a history of hypertension, hyperlipidemia, chronic back pain, and GERD presenting with word finding difficulty.  The patient was last known normal on 09/13/2020.  Her symptoms began with a left-sided frontal headache on 09/13/20. This persisted into 09/14/2020.  Her spouse noted the patient to be "confused" on 09/14/2020 with difficulty finishing her sentences.  The patient had some word finding difficulty which persisted into 09/15/2020 and she was admitted to [facility]. MRI brain--acute/subacute L-MCA infarct; remote bilateral infarct corona radiata,left caudate and pons. She was discharged home on 09/17/20 with referral for outpatient SLP therapy due to aphasia s/p CVA.    Behavioral/Cognition alert and cooperative    Mobility Status ambulatory      Balance Screen   Has the patient fallen in the past 6 months No    Has the patient had a decrease in activity level because of a fear of falling?  No  Is the patient reluctant to leave their home because of a fear of falling?  No      Prior Functional Status   Cognitive/Linguistic Baseline Within functional limits    Type of Home House     Lives With Spouse   grandchildren   Available Support Family    Education GED    Vocation Full time employment      Pain Assessment   Pain Assessment No/denies pain      Cognition   Overall Cognitive Status Impaired/Different from baseline   negatively impacted by aphasia   Area of Impairment Following commands;Problem solving    Following Commands Follows one step commands consistently    Problem Solving Requires verbal cues    Memory Appears intact    Awareness Appears intact    Problem Solving Impaired    Problem Solving Impairment Verbal complex     Executive Function Self Monitoring;Organizing    Organizing Impaired    Organizing Impairment Verbal complex    Self Monitoring Impaired    Self Monitoring Impairment Verbal complex      Auditory Comprehension   Overall Auditory Comprehension Impaired    Yes/No Questions Impaired    Complex Questions 50-74% accurate    Commands Impaired    One Step Basic Commands 75-100% accurate    Two Step Basic Commands 50-74% accurate    Conversation Simple    EffectiveTechniques Extra processing time;Repetition;Stressing words;Visual/Gestural cues      Visual Recognition/Discrimination   Discrimination Within Function Limits      Reading Comprehension   Reading Status Impaired    Word level 76-100% accurate    Sentence Level 51-75% accurate    Functional Environmental (signs, name badge) Within functional limits      Expression   Primary Mode of Expression Verbal      Verbal Expression   Overall Verbal Expression Impaired    Initiation No impairment    Automatic Speech Social Response;Counting;Day of week    Level of Generative/Spontaneous Verbalization Phrase    Repetition Impaired    Level of Impairment Phrase level    Naming Impairment    Responsive 51-75% accurate    Confrontation 50-74% accurate   for high frequency words/vocabulary   Convergent Not tested    Divergent 25-49% accurate   4 animals   Verbal Errors Semantic paraphasias;Not aware of errors;Aware of errors;Inconsistent    Pragmatics No impairment    Effective Techniques Semantic cues;Sentence completion;Phonemic cues;Written cues    Non-Verbal Means of Communication Not applicable      Written Expression   Dominant Hand Right    Written Expression Exceptions to [facility]    Dictation Ability Word      Oral Motor/Sensory Function   Overall Oral Motor/Sensory Function Appears within functional limits for tasks assessed      Motor Speech   Overall Motor Speech Appears within functional limits for tasks assessed     Respiration Within functional limits    Phonation Normal    Resonance Within functional limits    Articulation Within functional limitis    Intelligibility Intelligible    Motor Planning Witnin functional limits    Motor Speech Errors Not applicable    Phonation WFL      Standardized Assessments   Standardized Assessments  Other Assessment   MAST 57/100 total score               SLP Short Term Goals - 09/27/20 1222      SLP SHORT  TERM GOAL #1   Title Pt will increase naming of common objects/pictures to 90% acc when provided with min multimodality cues    Baseline 80% acc for high frequency words    Time 4    Period Weeks    Status New    Target Date 11/01/20      SLP SHORT TERM GOAL #2   Title Pt will describe objects and pictures by providing at least three salient features as judged by clinician with 90% acc when provided min cues.    Baseline 75% acc    Time 4    Period Weeks    Status New    Target Date 11/01/20      SLP SHORT TERM GOAL #3   Title Pt will increase divergent naming to 8+ items per concrete category with mod cues.    Baseline 5    Time 4    Period Weeks    Status New    Target Date 11/01/20      SLP SHORT TERM GOAL #4   Title Pt will respond to open-ended questions using a complete sentence with 80% acc and min cues.    Baseline 70%    Time 4    Period Weeks    Status New    Target Date 11/01/20      SLP SHORT TERM GOAL #5   Title Pt will verbalize 7+ word sentence using subject, verb, object format when describing pictures with 90% acc with min cues    Baseline 50%    Time 4    Period Weeks    Status New    Target Date 11/01/20      Additional Short Term Goals   Additional Short Term Goals Yes      SLP SHORT TERM GOAL #6   Title Pt will complete basic sentence level reading comprehension tasks with 80% acc with provided mi/mod cues.    Baseline 40%    Time 4    Period Weeks    Status New    Target Date 11/01/20      SLP  SHORT TERM GOAL #7   Title Pt will answer moderate level auditory comprehension yes/no questions with 80% acc when provided mi/mod cues.    Baseline 60%    Time 4    Period Weeks    Status New    Target Date 11/01/20           SLP Education - 09/27/20 1217    Education Details Plan for SLP therapy 2x per week to address expressive and receptive aphasia; provided information regarding aphasia and HEP    Person(s) Educated Patient    Methods Explanation;Handout    Comprehension Verbalized understanding             SLP Long Term Goals - 09/27/20 1224      SLP LONG TERM GOAL #1   Title Pt will communicate moderate level wants/needs/thoughts to family and friends with use of multimodality communication strategies as needed.            Plan - 09/27/20 1221    Clinical Impression Statement Pt presents with moderate expressive aphasia and mi/mod receptive aphasia with deficits with impairments in confrontation naming, single word sentence completion, repetition, understanding moderately complex yes/no questions, following auditory and written instructions, writing, and verbal fluency. Pt was administered the [place] (MAST) and achieved an overall expressive score of 25/50, receptive score of 32/50, and a total score of  57/100. Pt exhibits semantic paraphasias and dysfluent speech. She demonstrated excellent command of object recognition and confrontation naming for high frequency words (80%). She is oriented and acknowledges frustration with her current expressive and receptive deficits. Pt is employed full time and is unable to return to work at this time. Pt will benefit from skilled SLP in order to address the above impairments, maximize independence, and decrease burden of care.    Speech Therapy Frequency 2x / week    Duration 4 weeks    Treatment/Interventions SLP instruction and feedback;Compensatory strategies;Compensatory techniques;Patient/family  education;Language facilitation;Multimodal communcation approach;Cueing hierarchy    Potential to Achieve Goals Good    Potential Considerations Financial resources    SLP Home Exercise Plan Pt will completed HEP as assigned to facilitate carryover of treatment strategies and techniques in home environment with use of written cues as needed.    Consulted and Agree with Plan of Care Patient           Patient will benefit from skilled therapeutic intervention in order to improve the following deficits and impairments:   Aphasia  Cognitive communication deficit    Problem List Patient Active Problem List   Diagnosis Date Noted  . Acute ischemic stroke ([place]) 09/16/2020  . Essential hypertension 09/16/2020  . Chronic back pain 09/16/2020  . GERD (gastroesophageal reflux disease) 09/16/2020  . Hyperlipidemia 09/16/2020  . Dyspepsia   . Family history of colon cancer 02/14/2017  . Abdominal pain 02/14/2017  . Abdominal pain, epigastric 07/11/2012  . Abdominal pain, left upper quadrant 07/11/2012  . Family history of malignant neoplasm of gastrointestinal tract 07/11/2012   Thank you,  Genene Churn, [place]  [facility] 09/27/2020, 12:28 PM  [place], [place], [address] Phone: [phone]   Fax:  [phone]  Name: Erika Ruiz MRN: [id number] Date of Birth: 03/17/1966

## 2020-09-30 ENCOUNTER — Encounter (HOSPITAL_COMMUNITY): Payer: Self-pay | Admitting: Speech Pathology

## 2020-09-30 ENCOUNTER — Ambulatory Visit (HOSPITAL_COMMUNITY): Payer: Commercial Managed Care - PPO | Admitting: Speech Pathology

## 2020-09-30 ENCOUNTER — Other Ambulatory Visit: Payer: Self-pay

## 2020-09-30 DIAGNOSIS — R41841 Cognitive communication deficit: Secondary | ICD-10-CM

## 2020-09-30 DIAGNOSIS — R4701 Aphasia: Secondary | ICD-10-CM | POA: Diagnosis not present

## 2020-09-30 NOTE — Therapy (Signed)
[place], [place], [address] Phone: [phone]   Fax:  [phone]  Speech Language Pathology Treatment  Patient Details  Name: Erika Ruiz MRN: [id number] Date of Birth: 1966-03-27 Referring Provider (SLP): Orson Eva, MD   Encounter Date: 09/30/2020   End of Session - 09/30/20 1007    Visit Number 2    Number of Visits 9    Date for SLP Re-Evaluation 11/01/20    Authorization Type UHC/UMR PPO   $50 copay, 25th visit review   SLP Start Time 0945    SLP Stop Time  1030    SLP Time Calculation (min) 45 min    Activity Tolerance Patient tolerated treatment well           Past Medical History:  Diagnosis Date  . Hyperlipemia   . Hypertension   . Uterine cancer ([place]) 1987  . Vitamin D deficiency disease     Past Surgical History:  Procedure Laterality Date  . ABDOMINAL HYSTERECTOMY     due to CA  . BIOPSY  03/13/2017   Procedure: BIOPSY;  Surgeon: Danie Binder, MD;  [facility];  Service: Endoscopy;;  gastric  . BREAST LUMPECTOMY Left   . COLONOSCOPY WITH PROPOFOL N/A 03/13/2017   Procedure: COLONOSCOPY WITH PROPOFOL;  Surgeon: Danie Binder, MD;  [facility];  Service: Endoscopy;  Laterality: N/A;  11:00am  . ESOPHAGOGASTRODUODENOSCOPY (EGD) WITH PROPOFOL N/A 03/13/2017   Procedure: ESOPHAGOGASTRODUODENOSCOPY (EGD) WITH PROPOFOL;  Surgeon: Danie Binder, MD;  [facility];  Service: Endoscopy;  Laterality: N/A;  . LEFT OOPHORECTOMY    . POLYPECTOMY  03/13/2017   Procedure: POLYPECTOMY;  Surgeon: Danie Binder, MD;  [facility];  Service: Endoscopy;;  colon  . VAGINAL DELIVERY     X2    There were no vitals filed for this visit.   Subjective Assessment - 09/30/20 1001    Subjective "You put your clothes on them." (hanger)    Patient is accompained by: Family member    Currently in Pain? No/denies             ADULT SLP TREATMENT  - 09/30/20 1003      General Information   Behavior/Cognition Alert;Cooperative;Pleasant mood    Patient Positioning Upright in chair    Oral care provided N/A    HPI 55 year old female with a history of hypertension, hyperlipidemia, chronic back pain, and GERD presenting with word finding difficulty.  The patient was last known normal on 09/13/2020.  Her symptoms began with a left-sided frontal headache on 09/13/20. This persisted into 09/14/2020.  Her spouse noted the patient to be "confused" on 09/14/2020 with difficulty finishing her sentences.  The patient had some word finding difficulty which persisted into 09/15/2020 and she was admitted to [facility]. MRI brain--acute/subacute L-MCA infarct; remote bilateral infarct corona radiata,left caudate and pons. She was discharged home on 09/17/20 with referral for outpatient SLP therapy due to aphasia s/p CVA.      Treatment Provided   Treatment provided Cognitive-Linquistic      Pain Assessment   Pain Assessment No/denies pain      Cognitive-Linquistic Treatment   Treatment focused on Aphasia;Patient/family/caregiver education    Skilled Treatment Administered [place] (BNT), caregiver and Pt education regarding aphasia and dysnomia.     Assessment / Recommendations / Plan   Plan Continue with current plan of care      Progression  Toward Goals   Progression toward goals Progressing toward goals              SLP Short Term Goals - 09/30/20 1011      SLP SHORT TERM GOAL #1   Title Pt will increase naming of common objects/pictures to 90% acc when provided with min multimodality cues    Baseline 80% acc for high frequency words    Time 4    Period Weeks    Status On-going    Target Date 11/01/20      SLP SHORT TERM GOAL #2   Title Pt will describe objects and pictures by providing at least three salient features as judged by clinician with 90% acc when provided min cues.    Baseline 75% acc    Time 4    Period Weeks    Status  On-going    Target Date 11/01/20      SLP SHORT TERM GOAL #3   Title Pt will increase divergent naming to 8+ items per concrete category with mod cues.    Baseline 5    Time 4    Period Weeks    Status On-going    Target Date 11/01/20      SLP SHORT TERM GOAL #4   Title Pt will respond to open-ended questions using a complete sentence with 80% acc and min cues.    Baseline 70%    Time 4    Period Weeks    Status On-going    Target Date 11/01/20      SLP SHORT TERM GOAL #5   Title Pt will verbalize 7+ word sentence using subject, verb, object format when describing pictures with 90% acc with min cues    Baseline 50%    Time 4    Period Weeks    Status On-going    Target Date 11/01/20      SLP SHORT TERM GOAL #6   Title Pt will complete basic sentence level reading comprehension tasks with 80% acc with provided mi/mod cues.    Baseline 40%    Time 4    Period Weeks    Status On-going    Target Date 11/01/20      SLP SHORT TERM GOAL #7   Title Pt will answer moderate level auditory comprehension yes/no questions with 80% acc when provided mi/mod cues.    Baseline 60%    Time 4    Period Weeks    Status On-going    Target Date 11/01/20            SLP Long Term Goals - 09/30/20 1012      SLP LONG TERM GOAL #1   Title Pt will communicate moderate level wants/needs/thoughts to family and friends with use of multimodality communication strategies as needed.            Plan - 09/30/20 1008    Clinical Impression Statement Pt scored 8/60 on the [facility]. She was consistently able to provide a self generated object description cue, but this did not help her verbalize the word. She rarely benefited from a phonemic cue, but was able to identify the correct word with written word bank. She completed confrontation naming of high frequency pictured words with 65% acc. Pt was provided with extensive HEP and her husband will be there to support her as needed. She was  also encouraged to try some free aphasia apps on her phone ([employer]). Next session, continue targeting goals and  focus on object description tasks.   Speech Therapy Frequency 2x / week    Duration 4 weeks    Treatment/Interventions SLP instruction and feedback;Compensatory strategies;Compensatory techniques;Patient/family education;Language facilitation;Multimodal communcation approach;Cueing hierarchy    Potential to Achieve Goals Good    Potential Considerations Financial resources    SLP Home Exercise Plan Pt will completed HEP as assigned to facilitate carryover of treatment strategies and techniques in home environment with use of written cues as needed.    Consulted and Agree with Plan of Care Patient           Patient will benefit from skilled therapeutic intervention in order to improve the following deficits and impairments:   Aphasia  Cognitive communication deficit    Problem List Patient Active Problem List   Diagnosis Date Noted  . Acute ischemic stroke ([place]) 09/16/2020  . Essential hypertension 09/16/2020  . Chronic back pain 09/16/2020  . GERD (gastroesophageal reflux disease) 09/16/2020  . Hyperlipidemia 09/16/2020  . Dyspepsia   . Family history of colon cancer 02/14/2017  . Abdominal pain 02/14/2017  . Abdominal pain, epigastric 07/11/2012  . Abdominal pain, left upper quadrant 07/11/2012  . Family history of malignant neoplasm of gastrointestinal tract 07/11/2012   Thank you,  Genene Churn, [place]  [facility] 09/30/2020, 10:13 AM  [place] [address] [place], [place], [address] Phone: [phone]   Fax:  [phone]   Name: DENE LANDSBERG MRN: [id number] Date of Birth: 1966-02-02

## 2020-10-06 ENCOUNTER — Ambulatory Visit (HOSPITAL_COMMUNITY): Payer: Commercial Managed Care - PPO | Admitting: Speech Pathology

## 2020-10-06 ENCOUNTER — Encounter (HOSPITAL_COMMUNITY): Payer: Self-pay | Admitting: Speech Pathology

## 2020-10-06 ENCOUNTER — Other Ambulatory Visit: Payer: Self-pay

## 2020-10-06 DIAGNOSIS — R41841 Cognitive communication deficit: Secondary | ICD-10-CM

## 2020-10-06 DIAGNOSIS — R4701 Aphasia: Secondary | ICD-10-CM | POA: Diagnosis not present

## 2020-10-06 NOTE — Therapy (Signed)
[facility], [place], [address] Phone: [phone]   Fax:  [phone]  Speech Language Pathology Treatment  Patient Details  Name: Erika Ruiz MRN: [id number] Date of Birth: [birth year] Referring Provider (SLP): Orson Eva, MD   Encounter Date: 10/06/2020   End of Session - 10/06/20 2110    Visit Number 3    Number of Visits 9    Date for SLP Re-Evaluation 11/01/20    Authorization Type U[employer] PPO   $50 copay, 25th visit review   SLP Start Time 1600    SLP Stop Time  1645    SLP Time Calculation (min) 45 min    Activity Tolerance Patient tolerated treatment well           Past Medical History:  Diagnosis Date  . Hyperlipemia   . Hypertension   . Uterine cancer ([place]) 1987  . Vitamin D deficiency disease     Past Surgical History:  Procedure Laterality Date  . ABDOMINAL HYSTERECTOMY     due to CA  . BIOPSY  03/13/2017   Procedure: BIOPSY;  Surgeon: Danie Binder, MD;  [facility];  Service: Endoscopy;;  gastric  . BREAST LUMPECTOMY Left   . COLONOSCOPY WITH PROPOFOL N/A 03/13/2017   Procedure: COLONOSCOPY WITH PROPOFOL;  Surgeon: Danie Binder, MD;  [facility];  Service: Endoscopy;  Laterality: N/A;  11:00am  . ESOPHAGOGASTRODUODENOSCOPY (EGD) WITH PROPOFOL N/A 03/13/2017   Procedure: ESOPHAGOGASTRODUODENOSCOPY (EGD) WITH PROPOFOL;  Surgeon: Danie Binder, MD;  [facility];  Service: Endoscopy;  Laterality: N/A;  . LEFT OOPHORECTOMY    . POLYPECTOMY  03/13/2017   Procedure: POLYPECTOMY;  Surgeon: Danie Binder, MD;  [facility];  Service: Endoscopy;;  colon  . VAGINAL DELIVERY     X2    There were no vitals filed for this visit.          ADULT SLP TREATMENT - 10/06/20 0001      General Information   Behavior/Cognition Alert;Cooperative;Pleasant mood    Patient Positioning Upright in chair    Oral care provided N/A    HPI  55 year old female with a history of hypertension, hyperlipidemia, chronic back pain, and GERD presenting with word finding difficulty.  The patient was last known normal on 09/13/2020.  Her symptoms began with a left-sided frontal headache on 09/13/20. This persisted into 09/14/2020.  Her spouse noted the patient to be "confused" on 09/14/2020 with difficulty finishing her sentences.  The patient had some word finding difficulty which persisted into 09/15/2020 and she was admitted to [facility]. MRI brain--acute/subacute L-MCA infarct; remote bilateral infarct corona radiata,left caudate and pons. She was discharged home on 09/17/20 with referral for outpatient SLP therapy due to aphasia s/p CVA.      Treatment Provided   Treatment provided Cognitive-Linquistic      Pain Assessment   Pain Assessment No/denies pain      Cognitive-Linquistic Treatment   Treatment focused on Aphasia;Patient/family/caregiver education    Skilled Treatment Treatment targeting naming; object description and ongoing patient/caregiver education      Assessment / Recommendations / Plan   Plan Continue with current plan of care      Progression Toward Goals   Progression toward goals Progressing toward goals            SLP Education - 10/06/20 2109    Education Details Reviewed strategies for object description and encouraged use of  HEP previously provided (education and recommendations)    Person(s) Educated Patient;Spouse    Methods Explanation    Comprehension Verbalized understanding            SLP Short Term Goals - 10/06/20 2121      SLP SHORT TERM GOAL #1   Title Pt will increase naming of common objects/pictures to 90% acc when provided with min multimodality cues    Baseline 80% acc for high frequency words, 76% high frequency words (5/25)    Time 4    Period Weeks    Status On-going    Target Date 11/01/20      SLP SHORT TERM GOAL #2   Title Pt will describe objects and pictures by providing at least three  salient features as judged by clinician with 90% acc when provided min cues.    Baseline 75% acc    Time 4    Period Weeks    Status On-going    Target Date 11/01/20      SLP SHORT TERM GOAL #3   Title Pt will increase divergent naming to 8+ items per concrete category with mod cues.    Baseline 5    Time 4    Period Weeks    Status On-going    Target Date 11/01/20      SLP SHORT TERM GOAL #4   Title Pt will respond to open-ended questions using a complete sentence with 80% acc and min cues.    Baseline 70%    Time 4    Period Weeks    Status On-going    Target Date 11/01/20      SLP SHORT TERM GOAL #5   Title Pt will verbalize 7+ word sentence using subject, verb, object format when describing pictures with 90% acc with min cues    Baseline 50%    Time 4    Period Weeks    Status On-going    Target Date 11/01/20      SLP SHORT TERM GOAL #6   Title Pt will complete basic sentence level reading comprehension tasks with 80% acc with provided mi/mod cues.    Baseline 40%    Time 4    Period Weeks    Status On-going    Target Date 11/01/20      SLP SHORT TERM GOAL #7   Title Pt will answer moderate level auditory comprehension yes/no questions with 80% acc when provided mi/mod cues.    Baseline 60%    Time 4    Period Weeks    Status On-going    Target Date 11/01/20            SLP Long Term Goals - 10/06/20 2123      SLP LONG TERM GOAL #1   Title Pt will communicate moderate level wants/needs/thoughts to family and friends with use of multimodality communication strategies as needed.            Plan - 10/06/20 2111    Clinical Impression Statement Pt was presented with 25 pictures of common objects/high frequency words and she named 19/25 with min/mod verbal cues with incorrect productions containing phonemic and lexical paraphasias. Pt with little benefit from sentence completion cue or phonemic cues. However, Pt often provided cue for herself recalling the  first and often the second letter of the word and when she did this she often produced the target word. Pt was vigilent in completing all papers provided in HEP from last session reporting  she reviewed them multiple times alone and with her spouse who tested and supported her throughout. She was provided 4-5 more written sentence completion worksheets along with a naming/written worksheet and other similar activities on paper. Pt is highly motivated to work outside of therapy. PT answered "what questions" with 40% answering 2/5 accurately e.g. "what object hangs on the wall and tells the months of the year and days of the week"; this task was challenging and Pt benefitted from describing the objects; note improvement in explaining the targeted word.    Speech Therapy Frequency 2x / week    Duration 4 weeks    Treatment/Interventions SLP instruction and feedback;Compensatory strategies;Compensatory techniques;Patient/family education;Language facilitation;Multimodal communcation approach;Cueing hierarchy    Potential to Achieve Goals Good    Potential Considerations Financial resources    SLP Home Exercise Plan Pt will completed HEP as assigned to facilitate carryover of treatment strategies and techniques in home environment with use of written cues as needed.    Consulted and Agree with Plan of Care Patient           Patient will benefit from skilled therapeutic intervention in order to improve the following deficits and impairments:   Aphasia  Cognitive communication deficit    Problem List Patient Active Problem List   Diagnosis Date Noted  . Acute ischemic stroke ([place]) 09/16/2020  . Essential hypertension 09/16/2020  . Chronic back pain 09/16/2020  . GERD (gastroesophageal reflux disease) 09/16/2020  . Hyperlipidemia 09/16/2020  . Dyspepsia   . Family history of colon cancer 02/14/2017  . Abdominal pain 02/14/2017  . Abdominal pain, epigastric 07/11/2012  . Abdominal pain, left  upper quadrant 07/11/2012  . Family history of malignant neoplasm of gastrointestinal tract 07/11/2012   Abhimanyu Cruces H. Roddie Mc, CCC-SLP Speech Language Pathologist  Wende Bushy 10/06/2020, 9:23 PM  [place] [address] [place], [place], [address] Phone: [phone]   Fax:  [phone]   Name: Erika Ruiz MRN: [id number] Date of Birth: 12/11/1965

## 2020-10-07 ENCOUNTER — Encounter (HOSPITAL_COMMUNITY): Payer: Self-pay | Admitting: Speech Pathology

## 2020-10-07 ENCOUNTER — Ambulatory Visit (HOSPITAL_COMMUNITY): Payer: Commercial Managed Care - PPO | Admitting: Speech Pathology

## 2020-10-07 DIAGNOSIS — R4701 Aphasia: Secondary | ICD-10-CM

## 2020-10-07 DIAGNOSIS — R41841 Cognitive communication deficit: Secondary | ICD-10-CM

## 2020-10-07 NOTE — Therapy (Signed)
[facility] [place], [place], [address] Phone: [phone]   Fax:  [phone]  Speech Language Pathology Treatment  Patient Details  Name: Erika Ruiz MRN: [id number] Date of Birth: 07-28-1965 Referring Provider (SLP): Orson Eva, MD   Encounter Date: 10/07/2020   End of Session - 10/07/20 1108    Visit Number 4    Number of Visits 9    Date for SLP Re-Evaluation 11/01/20    Authorization Type UHC/UMR PPO   $50 copay, 25th visit review   SLP Start Time 0945    SLP Stop Time  1030    SLP Time Calculation (min) 45 min    Activity Tolerance Patient tolerated treatment well           Past Medical History:  Diagnosis Date  . Hyperlipemia   . Hypertension   . Uterine cancer ([place]) 1987  . Vitamin D deficiency disease     Past Surgical History:  Procedure Laterality Date  . ABDOMINAL HYSTERECTOMY     due to CA  . BIOPSY  03/13/2017   Procedure: BIOPSY;  Surgeon: Danie Binder, MD;  [facility];  Service: Endoscopy;;  gastric  . BREAST LUMPECTOMY Left   . COLONOSCOPY WITH PROPOFOL N/A 03/13/2017   Procedure: COLONOSCOPY WITH PROPOFOL;  Surgeon: Danie Binder, MD;  [facility];  Service: Endoscopy;  Laterality: N/A;  11:00am  . ESOPHAGOGASTRODUODENOSCOPY (EGD) WITH PROPOFOL N/A 03/13/2017   Procedure: ESOPHAGOGASTRODUODENOSCOPY (EGD) WITH PROPOFOL;  Surgeon: Danie Binder, MD;  [facility];  Service: Endoscopy;  Laterality: N/A;  . LEFT OOPHORECTOMY    . POLYPECTOMY  03/13/2017   Procedure: POLYPECTOMY;  Surgeon: Danie Binder, MD;  [facility];  Service: Endoscopy;;  colon  . VAGINAL DELIVERY     X2    There were no vitals filed for this visit.   Subjective Assessment - 10/07/20 1045    Subjective "You swing it." (bat)    Patient is accompained by: Family member    Currently in Pain? No/denies                 ADULT SLP TREATMENT - 10/07/20  1058      General Information   Behavior/Cognition Alert;Cooperative;Pleasant mood    Patient Positioning Upright in chair    Oral care provided N/A    HPI 55 year old female with a history of hypertension, hyperlipidemia, chronic back pain, and GERD presenting with word finding difficulty.  The patient was last known normal on 09/13/2020.  Her symptoms began with a left-sided frontal headache on 09/13/20. This persisted into 09/14/2020.  Her spouse noted the patient to be "confused" on 09/14/2020 with difficulty finishing her sentences.  The patient had some word finding difficulty which persisted into 09/15/2020 and she was admitted to [facility]. MRI brain--acute/subacute L-MCA infarct; remote bilateral infarct corona radiata,left caudate and pons. She was discharged home on 09/17/20 with referral for outpatient SLP therapy due to aphasia s/p CVA.      Treatment Provided   Treatment provided Cognitive-Linquistic      Pain Assessment   Pain Assessment No/denies pain      Cognitive-Linquistic Treatment   Treatment focused on Aphasia;Patient/family/caregiver education    Skilled Treatment SLP provided skilled treatment targeting word finding strategies, confrontation naming, reading comprehension, expanding upon utterances, and providing appropriate cues via written prompts, single word sentence completion, and phonemic cues.      Assessment /  Recommendations / Plan   Plan Continue with current plan of care      Progression Toward Goals   Progression toward goals Progressing toward goals            SLP Education - 10/07/20 1107    Education Details provided attional HEP and encouraged Pt to use the free [employer]act[employer] app at home    Person(s) Educated Patient;Spouse    Methods Explanation    Comprehension Verbalized understanding            SLP Short Term Goals - 10/07/20 1115      SLP SHORT TERM GOAL #1   Title Pt will increase naming of common objects/pictures to 90% acc when provided with min  multimodality cues    Baseline 80% acc for high frequency words, 76% high frequency words (5/25)    Time 4    Period Weeks    Status On-going    Target Date 11/01/20      SLP SHORT TERM GOAL #2   Title Pt will describe objects and pictures by providing at least three salient features as judged by clinician with 90% acc when provided min cues.    Baseline 75% acc    Time 4    Period Weeks    Status On-going    Target Date 11/01/20      SLP SHORT TERM GOAL #3   Title Pt will increase divergent naming to 8+ items per concrete category with mod cues.    Baseline 5    Time 4    Period Weeks    Status On-going    Target Date 11/01/20      SLP SHORT TERM GOAL #4   Title Pt will respond to open-ended questions using a complete sentence with 80% acc and min cues.    Baseline 70%    Time 4    Period Weeks    Status On-going    Target Date 11/01/20      SLP SHORT TERM GOAL #5   Title Pt will verbalize 7+ word sentence using subject, verb, object format when describing pictures with 90% acc with min cues    Baseline 50%    Time 4    Period Weeks    Status On-going    Target Date 11/01/20      SLP SHORT TERM GOAL #6   Title Pt will complete basic sentence level reading comprehension tasks with 80% acc with provided mi/mod cues.    Baseline 40%    Time 4    Period Weeks    Status On-going    Target Date 11/01/20      SLP SHORT TERM GOAL #7   Title Pt will answer moderate level auditory comprehension yes/no questions with 80% acc when provided mi/mod cues.    Baseline 60%    Time 4    Period Weeks    Status On-going    Target Date 11/01/20            SLP Long Term Goals - 10/07/20 1116      SLP LONG TERM GOAL #1   Title Pt will communicate moderate level wants/needs/thoughts to family and friends with use of multimodality communication strategies as needed.            Plan - 10/07/20 1108    Clinical Impression Statement Pt was accompanied to therapy by her  husband. She completed all assigned homework with some assist from her spouse. In session, she completed confrontation  naming tasks with 90% acc via Tactus app, phrase to picture reading comprehension with 88% acc, and sentence generation with 90% acc with min assist. Pt tends to get "stuck" when she is looking for a specific word and needs moderate cues to use strategies to facilitate expression (look away, cover the word/picture, use gestures, and provide alternative descriptive words). She demonstrated improvement when provided phonemic and sentence completion cues. Pt continues to be motivated and is demonstrating good progress toward goals. Continue plan of care and focus on expanding utterances during picture description tasks next session.    Speech Therapy Frequency 2x / week    Duration 4 weeks    Treatment/Interventions SLP instruction and feedback;Compensatory strategies;Compensatory techniques;Patient/family education;Language facilitation;Multimodal communcation approach;Cueing hierarchy    Potential to Achieve Goals Good    Potential Considerations Financial resources    SLP Home Exercise Plan Pt will completed HEP as assigned to facilitate carryover of treatment strategies and techniques in home environment with use of written cues as needed.    Consulted and Agree with Plan of Care Patient           Patient will benefit from skilled therapeutic intervention in order to improve the following deficits and impairments:   Aphasia  Cognitive communication deficit    Problem List Patient Active Problem List   Diagnosis Date Noted  . Acute ischemic stroke ([place]) 09/16/2020  . Essential hypertension 09/16/2020  . Chronic back pain 09/16/2020  . GERD (gastroesophageal reflux disease) 09/16/2020  . Hyperlipidemia 09/16/2020  . Dyspepsia   . Family history of colon cancer 02/14/2017  . Abdominal pain 02/14/2017  . Abdominal pain, epigastric 07/11/2012  . Abdominal pain, left upper  quadrant 07/11/2012  . Family history of malignant neoplasm of gastrointestinal tract 07/11/2012   Thank you,  Genene Churn, [place]  [facility] 10/07/2020, 11:16 AM  [place] [address] [place], [place], [address] Phone: [phone]   Fax:  [phone]   Name: MARCELA ALATORRE MRN: [id number] Date of Birth: 1965/05/17

## 2020-10-12 ENCOUNTER — Other Ambulatory Visit: Payer: Self-pay

## 2020-10-12 ENCOUNTER — Ambulatory Visit (HOSPITAL_COMMUNITY): Payer: Commercial Managed Care - PPO | Admitting: Speech Pathology

## 2020-10-12 ENCOUNTER — Encounter (HOSPITAL_COMMUNITY): Payer: Self-pay | Admitting: Speech Pathology

## 2020-10-12 DIAGNOSIS — R41841 Cognitive communication deficit: Secondary | ICD-10-CM

## 2020-10-12 DIAGNOSIS — R4701 Aphasia: Secondary | ICD-10-CM | POA: Diagnosis not present

## 2020-10-12 NOTE — Therapy (Signed)
[place], [place], [address] Phone: [phone]   Fax:  [phone]  Speech Language Pathology Treatment  Patient Details  Name: Erika Ruiz MRN: [id number] Date of Birth: 02/07/1966 Referring Provider (SLP): Orson Eva, MD   Encounter Date: 10/12/2020   End of Session - 10/12/20 1631    Visit Number 5    Number of Visits 9    Date for SLP Re-Evaluation 11/01/20    Authorization Type UHC/UMR PPO   $50 copay, 25th visit review   SLP Start Time 1515    SLP Stop Time  1600    SLP Time Calculation (min) 45 min    Activity Tolerance Patient tolerated treatment well           Past Medical History:  Diagnosis Date  . Hyperlipemia   . Hypertension   . Uterine cancer ([place]) 1987  . Vitamin D deficiency disease     Past Surgical History:  Procedure Laterality Date  . ABDOMINAL HYSTERECTOMY     due to CA  . BIOPSY  03/13/2017   Procedure: BIOPSY;  Surgeon: Danie Binder, MD;  [facility];  Service: Endoscopy;;  gastric  . BREAST LUMPECTOMY Left   . COLONOSCOPY WITH PROPOFOL N/A 03/13/2017   Procedure: COLONOSCOPY WITH PROPOFOL;  Surgeon: Danie Binder, MD;  [facility];  Service: Endoscopy;  Laterality: N/A;  11:00am  . ESOPHAGOGASTRODUODENOSCOPY (EGD) WITH PROPOFOL N/A 03/13/2017   Procedure: ESOPHAGOGASTRODUODENOSCOPY (EGD) WITH PROPOFOL;  Surgeon: Danie Binder, MD;  [facility];  Service: Endoscopy;  Laterality: N/A;  . LEFT OOPHORECTOMY    . POLYPECTOMY  03/13/2017   Procedure: POLYPECTOMY;  Surgeon: Danie Binder, MD;  [facility];  Service: Endoscopy;;  colon  . VAGINAL DELIVERY     X2    There were no vitals filed for this visit.   Subjective Assessment - 10/12/20 1529    Subjective "He had to help me some."    Patient is accompained by: Family member    Currently in Pain? No/denies              ADULT SLP TREATMENT - 10/12/20  1553      General Information   Behavior/Cognition Alert;Cooperative;Pleasant mood    Patient Positioning Upright in chair    Oral care provided N/A    HPI 55 year old female with a history of hypertension, hyperlipidemia, chronic back pain, and GERD presenting with word finding difficulty.  The patient was last known normal on 09/13/2020.  Her symptoms began with a left-sided frontal headache on 09/13/20. This persisted into 09/14/2020.  Her spouse noted the patient to be "confused" on 09/14/2020 with difficulty finishing her sentences.  The patient had some word finding difficulty which persisted into 09/15/2020 and she was admitted to [facility]. MRI brain--acute/subacute L-MCA infarct; remote bilateral infarct corona radiata,left caudate and pons. She was discharged home on 09/17/20 with referral for outpatient SLP therapy due to aphasia s/p CVA.      Treatment Provided   Treatment provided Cognitive-Linquistic      Pain Assessment   Pain Assessment No/denies pain      Cognitive-Linquistic Treatment   Treatment focused on Aphasia;Patient/family/caregiver education    Skilled Treatment SLP provided skilled treatment targeting word finding strategies, confrontation naming, reading comprehension, expanding upon utterances, and providing appropriate cues via written prompts, single word sentence completion, and phonemic cues.      Assessment / Recommendations /  Plan   Plan Continue with current plan of care      Progression Toward Goals   Progression toward goals Progressing toward goals              SLP Short Term Goals - 10/12/20 1632      SLP SHORT TERM GOAL #1   Title Pt will increase naming of common objects/pictures to 90% acc when provided with min multimodality cues    Baseline 80% acc for high frequency words, 76% high frequency words (5/25)    Time 4    Period Weeks    Status On-going    Target Date 11/01/20      SLP SHORT TERM GOAL #2   Title Pt will describe objects and pictures by  providing at least three salient features as judged by clinician with 90% acc when provided min cues.    Baseline 75% acc    Time 4    Period Weeks    Status On-going    Target Date 11/01/20      SLP SHORT TERM GOAL #3   Title Pt will increase divergent naming to 8+ items per concrete category with mod cues.    Baseline 5    Time 4    Period Weeks    Status On-going    Target Date 11/01/20      SLP SHORT TERM GOAL #4   Title Pt will respond to open-ended questions using a complete sentence with 80% acc and min cues.    Baseline 70%    Time 4    Period Weeks    Status On-going    Target Date 11/01/20      SLP SHORT TERM GOAL #5   Title Pt will verbalize 7+ word sentence using subject, verb, object format when describing pictures with 90% acc with min cues    Baseline 50%    Time 4    Period Weeks    Status On-going    Target Date 11/01/20      SLP SHORT TERM GOAL #6   Title Pt will complete basic sentence level reading comprehension tasks with 80% acc with provided mi/mod cues.    Baseline 40%    Time 4    Period Weeks    Status On-going    Target Date 11/01/20      SLP SHORT TERM GOAL #7   Title Pt will answer moderate level auditory comprehension yes/no questions with 80% acc when provided mi/mod cues.    Baseline 60%    Time 4    Period Weeks    Status On-going    Target Date 11/01/20            SLP Long Term Goals - 10/12/20 1632      SLP LONG TERM GOAL #1   Title Pt will communicate moderate level wants/needs/thoughts to family and friends with use of multimodality communication strategies as needed.            Plan - 10/12/20 1632    Clinical Impression Statement Pt was accompanied to therapy by her husband. She completed all assigned homework with some assist from her spouse. Session focused on expanding mean length of utterances when describing pictures. SLP first reviewed word description via semantic features analysis by answering specific  questions to describe items. Pt then encouraged to look at the picture provided and describe by first identifying "who" and "what doing" to then make a sentence ("The little boy is putting stickers on  the paper."). She required mild/mod cues to then state the entire sentence. Pt continues to be motivated and is demonstrating good progress toward goals. Continue plan of care and focus on expanding utterances during picture description tasks next session.    Speech Therapy Frequency 2x / week    Duration 4 weeks    Treatment/Interventions SLP instruction and feedback;Compensatory strategies;Compensatory techniques;Patient/family education;Language facilitation;Multimodal communcation approach;Cueing hierarchy    Potential to Achieve Goals Good    Potential Considerations Financial resources    SLP Home Exercise Plan Pt will completed HEP as assigned to facilitate carryover of treatment strategies and techniques in home environment with use of written cues as needed.    Consulted and Agree with Plan of Care Patient           Patient will benefit from skilled therapeutic intervention in order to improve the following deficits and impairments:   Aphasia  Cognitive communication deficit    Problem List Patient Active Problem List   Diagnosis Date Noted  . Acute ischemic stroke ([place]) 09/16/2020  . Essential hypertension 09/16/2020  . Chronic back pain 09/16/2020  . GERD (gastroesophageal reflux disease) 09/16/2020  . Hyperlipidemia 09/16/2020  . Dyspepsia   . Family history of colon cancer 02/14/2017  . Abdominal pain 02/14/2017  . Abdominal pain, epigastric 07/11/2012  . Abdominal pain, left upper quadrant 07/11/2012  . Family history of malignant neoplasm of gastrointestinal tract 07/11/2012   Thank you,  Genene Churn, [place]  [facility] 10/12/2020, 4:33 PM  [place] [address] [place], [place], [address] Phone:  [phone]   Fax:  [phone]   Name: Erika Ruiz MRN: [id number] Date of Birth: 09-29-1965

## 2020-10-14 ENCOUNTER — Ambulatory Visit (HOSPITAL_COMMUNITY): Payer: Commercial Managed Care - PPO | Admitting: Speech Pathology

## 2020-10-19 ENCOUNTER — Encounter (HOSPITAL_COMMUNITY): Payer: Self-pay | Admitting: Speech Pathology

## 2020-10-19 ENCOUNTER — Ambulatory Visit (HOSPITAL_COMMUNITY): Payer: Commercial Managed Care - PPO | Attending: Internal Medicine | Admitting: Speech Pathology

## 2020-10-19 ENCOUNTER — Other Ambulatory Visit: Payer: Self-pay

## 2020-10-19 DIAGNOSIS — R41841 Cognitive communication deficit: Secondary | ICD-10-CM | POA: Diagnosis present

## 2020-10-19 DIAGNOSIS — R4701 Aphasia: Secondary | ICD-10-CM | POA: Diagnosis not present

## 2020-10-19 NOTE — Therapy (Signed)
[place], [place], [address] Phone: [phone]   Fax:  [phone]  Speech Language Pathology Treatment  Patient Details  Name: Erika Ruiz MRN: [id number] Date of Birth: 17-Mar-1966 Referring Provider (SLP): Orson Eva, MD   Encounter Date: 10/19/2020   End of Session - 10/19/20 1531    Visit Number 6    Number of Visits 9    Date for SLP Re-Evaluation 11/01/20    Authorization Type U[employer] PPO   $50 copay, 25th visit review   SLP Start Time 1515    SLP Stop Time  1600    SLP Time Calculation (min) 45 min    Activity Tolerance Patient tolerated treatment well           Past Medical History:  Diagnosis Date  . Hyperlipemia   . Hypertension   . Uterine cancer ([place]) 1987  . Vitamin D deficiency disease     Past Surgical History:  Procedure Laterality Date  . ABDOMINAL HYSTERECTOMY     due to CA  . BIOPSY  03/13/2017   Procedure: BIOPSY;  Surgeon: Danie Binder, MD;  [facility];  Service: Endoscopy;;  gastric  . BREAST LUMPECTOMY Left   . COLONOSCOPY WITH PROPOFOL N/A 03/13/2017   Procedure: COLONOSCOPY WITH PROPOFOL;  Surgeon: Danie Binder, MD;  [facility];  Service: Endoscopy;  Laterality: N/A;  11:00am  . ESOPHAGOGASTRODUODENOSCOPY (EGD) WITH PROPOFOL N/A 03/13/2017   Procedure: ESOPHAGOGASTRODUODENOSCOPY (EGD) WITH PROPOFOL;  Surgeon: Danie Binder, MD;  [facility];  Service: Endoscopy;  Laterality: N/A;  . LEFT OOPHORECTOMY    . POLYPECTOMY  03/13/2017   Procedure: POLYPECTOMY;  Surgeon: Danie Binder, MD;  [facility];  Service: Endoscopy;;  colon  . VAGINAL DELIVERY     X2    There were no vitals filed for this visit.   Subjective Assessment - 10/19/20 1524    Subjective "I did all the reading homework."    Patient is accompained by: Family member    Currently in Pain? No/denies             ADULT SLP TREATMENT -  10/19/20 1529      General Information   Behavior/Cognition Alert;Cooperative;Pleasant mood    Patient Positioning Upright in chair    Oral care provided N/A    HPI 55 year old female with a history of hypertension, hyperlipidemia, chronic back pain, and GERD presenting with word finding difficulty.  The patient was last known normal on 09/13/2020.  Her symptoms began with a left-sided frontal headache on 09/13/20. This persisted into 09/14/2020.  Her spouse noted the patient to be "confused" on 09/14/2020 with difficulty finishing her sentences.  The patient had some word finding difficulty which persisted into 09/15/2020 and she was admitted to [facility]. MRI brain--acute/subacute L-MCA infarct; remote bilateral infarct corona radiata,left caudate and pons. She was discharged home on 09/17/20 with referral for outpatient SLP therapy due to aphasia s/p CVA.      Treatment Provided   Treatment provided Cognitive-Linquistic      Pain Assessment   Pain Assessment No/denies pain      Cognitive-Linquistic Treatment   Treatment focused on Aphasia;Patient/family/caregiver education    Skilled Treatment SLP provided skilled treatment targeting word finding strategies, confrontation naming, reading comprehension, expanding upon utterances, and providing appropriate cues via written prompts, single word sentence completion, and phonemic cues.      Assessment / Recommendations /  Plan   Plan Continue with current plan of care      Progression Toward Goals   Progression toward goals Progressing toward goals              SLP Short Term Goals - 10/19/20 1532      SLP SHORT TERM GOAL #1   Title Pt will increase naming of common objects/pictures to 90% acc when provided with min multimodality cues    Baseline 80% acc for high frequency words, 76% high frequency words (5/25)    Time 4    Period Weeks    Status On-going    Target Date 11/01/20      SLP SHORT TERM GOAL #2   Title Pt will describe objects and  pictures by providing at least three salient features as judged by clinician with 90% acc when provided min cues.    Baseline 75% acc    Time 4    Period Weeks    Status On-going    Target Date 11/01/20      SLP SHORT TERM GOAL #3   Title Pt will increase divergent naming to 8+ items per concrete category with mod cues.    Baseline 5    Time 4    Period Weeks    Status On-going    Target Date 11/01/20      SLP SHORT TERM GOAL #4   Title Pt will respond to open-ended questions using a complete sentence with 80% acc and min cues.    Baseline 70%    Time 4    Period Weeks    Status On-going    Target Date 11/01/20      SLP SHORT TERM GOAL #5   Title Pt will verbalize 7+ word sentence using subject, verb, object format when describing pictures with 90% acc with min cues    Baseline 50%    Time 4    Period Weeks    Status On-going    Target Date 11/01/20      SLP SHORT TERM GOAL #6   Title Pt will complete basic sentence level reading comprehension tasks with 80% acc with provided mi/mod cues.    Baseline 40%    Time 4    Period Weeks    Status On-going    Target Date 11/01/20      SLP SHORT TERM GOAL #7   Title Pt will answer moderate level auditory comprehension yes/no questions with 80% acc when provided mi/mod cues.    Baseline 60%    Time 4    Period Weeks    Status On-going    Target Date 11/01/20            SLP Long Term Goals - 10/19/20 1537      SLP LONG TERM GOAL #1   Title Pt will communicate moderate level wants/needs/thoughts to family and friends with use of multimodality communication strategies as needed.    Status On-going            Plan - 10/19/20 1532    Clinical Impression Statement Pt was accompanied to therapy by her husband. She completed all assigned reading comprehension homework with 100% acc (short paragraph). Session focused on expanding mean length of utterances when describing pictures.  Pt encouraged to look at the picture  provided and describe by first identifying "who" and "what doing" and "to what" to then make a sentence ("The little boy is blowing the bubbles."). She required min cues to then state  the entire sentence. Task was expanded today by having Pt write the sentence with verbal mediation cues. She completed divergent naming tasks (colors, vegetables, and transportation) with 9 items per category with min assist.  Pt continues to be motivated and is demonstrating good progress toward goals. Continue plan of care and focus on expanding utterances during conversational tasks and answering open ended questions.   Speech Therapy Frequency 2x / week    Duration 4 weeks    Treatment/Interventions SLP instruction and feedback;Compensatory strategies;Compensatory techniques;Patient/family education;Language facilitation;Multimodal communcation approach;Cueing hierarchy    Potential to Achieve Goals Good    Potential Considerations Financial resources    SLP Home Exercise Plan Pt will completed HEP as assigned to facilitate carryover of treatment strategies and techniques in home environment with use of written cues as needed.    Consulted and Agree with Plan of Care Patient           Patient will benefit from skilled therapeutic intervention in order to improve the following deficits and impairments:   Aphasia  Cognitive communication deficit    Problem List Patient Active Problem List   Diagnosis Date Noted  . Acute ischemic stroke ([place]) 09/16/2020  . Essential hypertension 09/16/2020  . Chronic back pain 09/16/2020  . GERD (gastroesophageal reflux disease) 09/16/2020  . Hyperlipidemia 09/16/2020  . Dyspepsia   . Family history of colon cancer 02/14/2017  . Abdominal pain 02/14/2017  . Abdominal pain, epigastric 07/11/2012  . Abdominal pain, left upper quadrant 07/11/2012  . Family history of malignant neoplasm of gastrointestinal tract 07/11/2012   Thank you,  Genene Churn,  [place]  [facility] 10/19/2020, 3:40 PM  [place] [address] [place], [place], [address] Phone: [phone]   Fax:  [phone]   Name: MARG MACMASTER MRN: [id number] Date of Birth: 04-17-1966

## 2020-10-21 ENCOUNTER — Encounter (HOSPITAL_COMMUNITY): Payer: Self-pay | Admitting: Speech Pathology

## 2020-10-21 ENCOUNTER — Ambulatory Visit (HOSPITAL_COMMUNITY): Payer: Commercial Managed Care - PPO | Admitting: Speech Pathology

## 2020-10-21 ENCOUNTER — Other Ambulatory Visit: Payer: Self-pay

## 2020-10-21 DIAGNOSIS — R4701 Aphasia: Secondary | ICD-10-CM

## 2020-10-21 DIAGNOSIS — R41841 Cognitive communication deficit: Secondary | ICD-10-CM

## 2020-10-21 NOTE — Therapy (Signed)
[place], [place], [address] Phone: [phone]   Fax:  [phone]  Speech Language Pathology Treatment  Patient Details  Name: Erika Ruiz MRN: [id number] Date of Birth: [birth year] Referring Provider (SLP): Orson Eva, MD   Encounter Date: 10/21/2020   End of Session - 10/21/20 1040     Visit Number 7    Number of Visits 9    Date for SLP Re-Evaluation 11/01/20    Authorization Type UHC/UMR PPO   $50 copay, 25th visit review   SLP Start Time 0955    SLP Stop Time  1040    SLP Time Calculation (min) 45 min    Activity Tolerance Patient tolerated treatment well             Past Medical History:  Diagnosis Date   Hyperlipemia    Hypertension    Uterine cancer ([place]) 1987   Vitamin D deficiency disease     Past Surgical History:  Procedure Laterality Date   ABDOMINAL HYSTERECTOMY     due to CA   BIOPSY  03/13/2017   Procedure: BIOPSY;  Surgeon: Danie Binder, MD;  [facility];  Service: Endoscopy;;  gastric   BREAST LUMPECTOMY Left    COLONOSCOPY WITH PROPOFOL N/A 03/13/2017   Procedure: COLONOSCOPY WITH PROPOFOL;  Surgeon: Danie Binder, MD;  [facility];  Service: Endoscopy;  Laterality: N/A;  11:00am   ESOPHAGOGASTRODUODENOSCOPY (EGD) WITH PROPOFOL N/A 03/13/2017   Procedure: ESOPHAGOGASTRODUODENOSCOPY (EGD) WITH PROPOFOL;  Surgeon: Danie Binder, MD;  [facility];  Service: Endoscopy;  Laterality: N/A;   LEFT OOPHORECTOMY     POLYPECTOMY  03/13/2017   Procedure: POLYPECTOMY;  Surgeon: Danie Binder, MD;  [facility];  Service: Endoscopy;;  colon   VAGINAL DELIVERY     X2    There were no vitals filed for this visit.   Subjective Assessment - 10/21/20 1020     Subjective "It was hard." (homework)    Currently in Pain? No/denies                   ADULT SLP TREATMENT - 10/21/20 1035       General Information    Behavior/Cognition Alert;Cooperative;Pleasant mood    Patient Positioning Upright in chair    Oral care provided N/A    HPI 55 year old female with a history of hypertension, hyperlipidemia, chronic back pain, and GERD presenting with word finding difficulty.  The patient was last known normal on 09/13/2020.  Her symptoms began with a left-sided frontal headache on 09/13/20. This persisted into 09/14/2020.  Her spouse noted the patient to be "confused" on 09/14/2020 with difficulty finishing her sentences.  The patient had some word finding difficulty which persisted into 09/15/2020 and she was admitted to [facility]. MRI brain--acute/subacute L-MCA infarct; remote bilateral infarct corona radiata,left caudate and pons. She was discharged home on 09/17/20 with referral for outpatient SLP therapy due to aphasia s/p CVA.      Treatment Provided   Treatment provided Cognitive-Linquistic      Pain Assessment   Pain Assessment No/denies pain      Cognitive-Linquistic Treatment   Treatment focused on Aphasia;Patient/family/caregiver education    Skilled Treatment SLP provided skilled treatment targeting word finding strategies, confrontation naming, reading comprehension, expanding upon utterances, and providing appropriate cues via written prompts, single word sentence completion, and phonemic cues.      Assessment / Recommendations /  Plan   Plan Continue with current plan of care              SLP Education - 10/21/20 1039     Education Details HEP for reading comprehension    Person(s) Educated Patient;Caregiver(s)    Methods Explanation;Handout    Comprehension Verbalized understanding              SLP Short Term Goals - 10/21/20 1112       SLP SHORT TERM GOAL #1   Title Pt will increase naming of common objects/pictures to 90% acc when provided with min multimodality cues    Baseline 80% acc for high frequency words, 76% high frequency words (5/25)    Time 4    Period Weeks    Status On-going     Target Date 11/01/20      SLP SHORT TERM GOAL #2   Title Pt will describe objects and pictures by providing at least three salient features as judged by clinician with 90% acc when provided min cues.    Baseline 75% acc    Time 4    Period Weeks    Status On-going    Target Date 11/01/20      SLP SHORT TERM GOAL #3   Title Pt will increase divergent naming to 8+ items per concrete category with mod cues.    Baseline 5    Time 4    Period Weeks    Status On-going    Target Date 11/01/20      SLP SHORT TERM GOAL #4   Title Pt will respond to open-ended questions using a complete sentence with 80% acc and min cues.    Baseline 70%    Time 4    Period Weeks    Status On-going    Target Date 11/01/20      SLP SHORT TERM GOAL #5   Title Pt will verbalize 7+ word sentence using subject, verb, object format when describing pictures with 90% acc with min cues    Baseline 50%    Time 4    Period Weeks    Status On-going    Target Date 11/01/20      SLP SHORT TERM GOAL #6   Title Pt will complete basic sentence level reading comprehension tasks with 80% acc with provided mi/mod cues.    Baseline 40%    Time 4    Period Weeks    Status On-going    Target Date 11/01/20      SLP SHORT TERM GOAL #7   Title Pt will answer moderate level auditory comprehension yes/no questions with 80% acc when provided mi/mod cues.    Baseline 60%    Time 4    Period Weeks    Status On-going    Target Date 11/01/20              SLP Long Term Goals - 10/21/20 1112       SLP LONG TERM GOAL #1   Title Pt will communicate moderate level wants/needs/thoughts to family and friends with use of multimodality communication strategies as needed.    Status On-going              Plan - 10/21/20 1041     Clinical Impression Statement Pt was accompanied to therapy by her husband. They reported that the reading comprehension homework was challenging and required increased assistance from  spouse. In session, SLP cued Pt to read through the text and highlight  answers to "who, what, when, where, why". She was able to orally read the paragraph with 90% acc with errors on multisyllabic and low frequency words ([place]). She had difficulty with comprehension and required mild/mod cues to answer multiple choice questions. She responded to open ended questions (Would you rather live in the city or the country and why?) with 100% acc when provided min cues and allowance for phrase responses. Pt was given additional reading comprehension HEP and also encouraged to download the [employer] app [employer]) to listen to [place]. Continue plan of care and target auditory comprehension and verbal expression next session.    Speech Therapy Frequency 2x / week    Duration 4 weeks    Treatment/Interventions SLP instruction and feedback;Compensatory strategies;Compensatory techniques;Patient/family education;Language facilitation;Multimodal communcation approach;Cueing hierarchy    Potential to Achieve Goals Good    Potential Considerations Financial resources    SLP Home Exercise Plan Pt will completed HEP as assigned to facilitate carryover of treatment strategies and techniques in home environment with use of written cues as needed.    Consulted and Agree with Plan of Care Patient             Patient will benefit from skilled therapeutic intervention in order to improve the following deficits and impairments:   Aphasia  Cognitive communication deficit    Problem List Patient Active Problem List   Diagnosis Date Noted   Acute ischemic stroke ([place]) 09/16/2020   Essential hypertension 09/16/2020   Chronic back pain 09/16/2020   GERD (gastroesophageal reflux disease) 09/16/2020   Hyperlipidemia 09/16/2020   Dyspepsia    Family history of colon cancer 02/14/2017   Abdominal pain 02/14/2017   Abdominal pain, epigastric 07/11/2012   Abdominal pain, left upper quadrant 07/11/2012   Family  history of malignant neoplasm of gastrointestinal tract 07/11/2012   Thank you,  Genene Churn, [place]  Rishabh Rinkenberger 10/21/2020, 11:13 AM  [place], [place], [address] Phone: [phone]   Fax:  [phone]   Name: Erika Ruiz MRN: [id number] Date of Birth: 28-Apr-1966

## 2020-10-26 ENCOUNTER — Encounter (HOSPITAL_COMMUNITY): Payer: Self-pay | Admitting: Speech Pathology

## 2020-10-26 ENCOUNTER — Ambulatory Visit (HOSPITAL_COMMUNITY): Payer: Commercial Managed Care - PPO | Admitting: Speech Pathology

## 2020-10-26 ENCOUNTER — Other Ambulatory Visit: Payer: Self-pay

## 2020-10-26 DIAGNOSIS — R4701 Aphasia: Secondary | ICD-10-CM | POA: Diagnosis not present

## 2020-10-26 DIAGNOSIS — R41841 Cognitive communication deficit: Secondary | ICD-10-CM

## 2020-10-26 NOTE — Therapy (Signed)
E[place] Ru[place], [place], [address] Phone: [phone]   Fax:  [phone]  Speech Language Pathology Treatment  Patient Details  Name: Erika Ruiz MRN: [id number] Date of Birth: 09/29/1965 Referring Provider (SLP): Orson Eva, MD   Encounter Date: 10/26/2020   End of Session - 10/26/20 1527     Visit Number 8    Number of Visits 9    Date for SLP Re-Evaluation 11/01/20    Authorization Type UHC/UMR PPO   $50 copay, 25th visit review   SLP Start Time 1515    SLP Stop Time  1600    SLP Time Calculation (min) 45 min    Activity Tolerance Patient tolerated treatment well             Past Medical History:  Diagnosis Date   Hyperlipemia    Hypertension    Uterine cancer ([place]) 1987   Vitamin D deficiency disease     Past Surgical History:  Procedure Laterality Date   ABDOMINAL HYSTERECTOMY     due to CA   BIOPSY  03/13/2017   Procedure: BIOPSY;  Surgeon: Danie Binder, MD;  [facility];  Service: Endoscopy;;  gastric   BREAST LUMPECTOMY Left    COLONOSCOPY WITH PROPOFOL N/A 03/13/2017   Procedure: COLONOSCOPY WITH PROPOFOL;  Surgeon: Danie Binder, MD;  [facility];  Service: Endoscopy;  Laterality: N/A;  11:00am   ESOPHAGOGASTRODUODENOSCOPY (EGD) WITH PROPOFOL N/A 03/13/2017   Procedure: ESOPHAGOGASTRODUODENOSCOPY (EGD) WITH PROPOFOL;  Surgeon: Danie Binder, MD;  [facility];  Service: Endoscopy;  Laterality: N/A;   LEFT OOPHORECTOMY     POLYPECTOMY  03/13/2017   Procedure: POLYPECTOMY;  Surgeon: Danie Binder, MD;  [facility];  Service: Endoscopy;;  colon   VAGINAL DELIVERY     X2    There were no vitals filed for this visit.   Subjective Assessment - 10/26/20 1519     Subjective "I did some."    Patient is accompained by: Family member    Currently in Pain? No/denies                   ADULT SLP TREATMENT - 10/26/20 1520        General Information   Behavior/Cognition Alert;Cooperative;Pleasant mood    Patient Positioning Upright in chair    Oral care provided N/A    HPI 55 year old female with a history of hypertension, hyperlipidemia, chronic back pain, and GERD presenting with word finding difficulty.  The patient was last known normal on 09/13/2020.  Her symptoms began with a left-sided frontal headache on 09/13/20. This persisted into 09/14/2020.  Her spouse noted the patient to be "confused" on 09/14/2020 with difficulty finishing her sentences.  The patient had some word finding difficulty which persisted into 09/15/2020 and she was admitted to [facility]. MRI brain--acute/subacute L-MCA infarct; remote bilateral infarct corona radiata,left caudate and pons. She was discharged home on 09/17/20 with referral for outpatient SLP therapy due to aphasia s/p CVA.      Treatment Provided   Treatment provided Cognitive-Linquistic      Pain Assessment   Pain Assessment No/denies pain      Cognitive-Linquistic Treatment   Treatment focused on Aphasia;Patient/family/caregiver education    Skilled Treatment SLP provided skilled treatment targeting word finding strategies, confrontation naming, reading comprehension, expanding upon utterances, and providing appropriate cues via written prompts, single word sentence completion, and phonemic cues.  Assessment / Recommendations / Plan   Plan Continue with current plan of care                SLP Short Term Goals - 10/26/20 1528       SLP SHORT TERM GOAL #1   Title Pt will increase naming of common objects/pictures to 90% acc when provided with min multimodality cues    Baseline 80% acc for high frequency words, 76% high frequency words (5/25)    Time 4    Period Weeks    Status Achieved    Target Date 11/01/20      SLP SHORT TERM GOAL #2   Title Pt will describe objects and pictures by providing at least three salient features as judged by clinician with 90% acc when  provided min cues.    Baseline 75% acc    Time 4    Period Weeks    Status On-going    Target Date 11/01/20      SLP SHORT TERM GOAL #3   Title Pt will increase divergent naming to 8+ items per concrete category with mod cues.    Baseline 5    Time 4    Period Weeks    Status On-going    Target Date 11/01/20      SLP SHORT TERM GOAL #4   Title Pt will respond to open-ended questions using a complete sentence with 80% acc and min cues.    Baseline 70%    Time 4    Period Weeks    Status On-going    Target Date 11/01/20      SLP SHORT TERM GOAL #5   Title Pt will verbalize 7+ word sentence using subject, verb, object format when describing pictures with 90% acc with min cues    Baseline 50%    Time 4    Period Weeks    Status On-going    Target Date 11/01/20      SLP SHORT TERM GOAL #6   Title Pt will complete basic sentence level reading comprehension tasks with 80% acc with provided mi/mod cues.    Baseline 40%    Time 4    Period Weeks    Status On-going    Target Date 11/01/20      SLP SHORT TERM GOAL #7   Title Pt will answer moderate level auditory comprehension yes/no questions with 80% acc when provided mi/mod cues.    Baseline 60%    Time 4    Period Weeks    Status On-going    Target Date 11/01/20              SLP Long Term Goals - 10/26/20 1552       SLP LONG TERM GOAL #1   Title Pt will communicate moderate level wants/needs/thoughts to family and friends with use of multimodality communication strategies as needed.    Status On-going              Plan - 10/26/20 1528     Clinical Impression Statement Pt was accompanied to therapy by her husband. She named common objects with 90% acc. She continues to have difficulty with divergent naming tasks, coming up with 6-8 with cues. Today, she was asked to listen to a short story, provide a brief verbal summary, and then try to write down main points. This proved very challenging and she required  mod cues for answering "wh" questions related to the story. She has made good  progress toward goals, however expressive and receptive aphasia persist. Goals will be updated next session.   Speech Therapy Frequency 2x / week    Duration 4 weeks    Treatment/Interventions SLP instruction and feedback;Compensatory strategies;Compensatory techniques;Patient/family education;Language facilitation;Multimodal communcation approach;Cueing hierarchy    Potential to Achieve Goals Good    Potential Considerations Financial resources    SLP Home Exercise Plan Pt will completed HEP as assigned to facilitate carryover of treatment strategies and techniques in home environment with use of written cues as needed.    Consulted and Agree with Plan of Care Patient             Patient will benefit from skilled therapeutic intervention in order to improve the following deficits and impairments:   Aphasia  Cognitive communication deficit    Problem List Patient Active Problem List   Diagnosis Date Noted   Acute ischemic stroke ([place]) 09/16/2020   Essential hypertension 09/16/2020   Chronic back pain 09/16/2020   GERD (gastroesophageal reflux disease) 09/16/2020   Hyperlipidemia 09/16/2020   Dyspepsia    Family history of colon cancer 02/14/2017   Abdominal pain 02/14/2017   Abdominal pain, epigastric 07/11/2012   Abdominal pain, left upper quadrant 07/11/2012   Family history of malignant neoplasm of gastrointestinal tract 07/11/2012   Thank you,  Genene Churn, [place]  Eain Mullendore 10/26/2020, 4:06 PM  [place], [place], [address] Phone: [phone]   Fax:  [phone]   Name: SABRIAH HOBBINS MRN: [id number] Date of Birth: 1965/12/09

## 2020-10-28 ENCOUNTER — Encounter (HOSPITAL_COMMUNITY): Payer: Self-pay | Admitting: Speech Pathology

## 2020-10-28 ENCOUNTER — Other Ambulatory Visit: Payer: Self-pay

## 2020-10-28 ENCOUNTER — Ambulatory Visit (HOSPITAL_COMMUNITY): Payer: Commercial Managed Care - PPO | Admitting: Speech Pathology

## 2020-10-28 DIAGNOSIS — R41841 Cognitive communication deficit: Secondary | ICD-10-CM

## 2020-10-28 DIAGNOSIS — R4701 Aphasia: Secondary | ICD-10-CM

## 2020-10-28 NOTE — Addendum Note (Signed)
Addended by: Ephraim Hamburger on: 10/28/2020 02:51 PM   Modules accepted: Orders

## 2020-10-28 NOTE — Therapy (Signed)
[place], [place], [address] Phone: [phone]   Fax:  [phone]  Speech Language Pathology Treatment  Patient Details  Name: Erika Ruiz MRN: [id number] Date of Birth: 1965/11/18 Referring Provider (SLP): Orson Eva, MD   Encounter Date: 10/28/2020   End of Session - 10/28/20 1014     Visit Number 9    Number of Visits 15    Date for SLP Re-Evaluation 12/23/20    Authorization Type [employer] PPO   $50 copay, 25th visit review   SLP Start Time 0945    SLP Stop Time  1030    SLP Time Calculation (min) 45 min    Activity Tolerance Patient tolerated treatment well             Past Medical History:  Diagnosis Date   Hyperlipemia    Hypertension    Uterine cancer ([place]) 1987   Vitamin D deficiency disease     Past Surgical History:  Procedure Laterality Date   ABDOMINAL HYSTERECTOMY     due to CA   BIOPSY  03/13/2017   Procedure: BIOPSY;  Surgeon: Danie Binder, MD;  [facility];  Service: Endoscopy;;  gastric   BREAST LUMPECTOMY Left    COLONOSCOPY WITH PROPOFOL N/A 03/13/2017   Procedure: COLONOSCOPY WITH PROPOFOL;  Surgeon: Danie Binder, MD;  [facility];  Service: Endoscopy;  Laterality: N/A;  11:00am   ESOPHAGOGASTRODUODENOSCOPY (EGD) WITH PROPOFOL N/A 03/13/2017   Procedure: ESOPHAGOGASTRODUODENOSCOPY (EGD) WITH PROPOFOL;  Surgeon: Danie Binder, MD;  [facility];  Service: Endoscopy;  Laterality: N/A;   LEFT OOPHORECTOMY     POLYPECTOMY  03/13/2017   Procedure: POLYPECTOMY;  Surgeon: Danie Binder, MD;  [facility];  Service: Endoscopy;;  colon   VAGINAL DELIVERY     X2    There were no vitals filed for this visit.   Subjective Assessment - 10/28/20 1006     Subjective "[place]"    Currently in Pain? No/denies                   ADULT SLP TREATMENT - 10/28/20 1012       General Information   Behavior/Cognition  Alert;Cooperative;Pleasant mood    Patient Positioning Upright in chair    Oral care provided N/A    HPI 55 year old female with a history of hypertension, hyperlipidemia, chronic back pain, and GERD presenting with word finding difficulty.  The patient was last known normal on 09/13/2020.  Her symptoms began with a left-sided frontal headache on 09/13/20. This persisted into 09/14/2020.  Her spouse noted the patient to be "confused" on 09/14/2020 with difficulty finishing her sentences.  The patient had some word finding difficulty which persisted into 09/15/2020 and she was admitted to [facility]. MRI brain--acute/subacute L-MCA infarct; remote bilateral infarct corona radiata,left caudate and pons. She was discharged home on 09/17/20 with referral for outpatient SLP therapy due to aphasia s/p CVA.      Treatment Provided   Treatment provided Cognitive-Linquistic      Pain Assessment   Pain Assessment No/denies pain      Cognitive-Linquistic Treatment   Treatment focused on Aphasia;Patient/family/caregiver education    Skilled Treatment SLP provided skilled treatment targeting word finding strategies, confrontation naming, reading comprehension, expanding upon utterances, and providing appropriate cues via written prompts, single word sentence completion, and phonemic cues.      Assessment / Recommendations / Plan  Plan Continue with current plan of care      Progression Toward Goals   Progression toward goals Progressing toward goals              SLP Education - 10/28/20 1114     Education Details Plan for 1x/week for 6 weeks due to financial burden; extensive homework provided    Person(s) Educated Patient;Spouse    Methods Explanation;Handout    Comprehension Verbalized understanding              SLP Short Term Goals - 10/28/20 1125       SLP SHORT TERM GOAL #1   Title Pt will increase naming of high and low frequency objects/pictures to 80% acc when provided with min multimodality  cues.    Baseline 90% acc for high frequency words    Time 8    Period Weeks    Status New    Target Date 12/23/20      SLP SHORT TERM GOAL #2   Title Pt will describe objects and pictures by providing at least three salient features as judged by clinician with 90% acc when provided min cues.    Baseline 75% acc    Time 4    Period Weeks    Status Achieved    Target Date 11/01/20      SLP SHORT TERM GOAL #3   Title Pt will increase divergent naming to 8+ items per concrete category with mod cues.    Baseline 5    Time 8    Period Weeks    Status Partially Met    Target Date 12/23/20      SLP SHORT TERM GOAL #4   Title Pt will respond to open-ended questions using a complete sentence with 90% acc and min cues.    Baseline 80% with min cues as of 10/28/20    Time 8    Period Weeks    Status New    Target Date 12/23/20      SLP SHORT TERM GOAL #5   Title Pt will verbalize 7+ word sentence using subject, verb, object format when describing pictures with 90% acc with min cues    Baseline 50%    Time 4    Period Weeks    Status Achieved    Target Date 11/01/20      Additional Short Term Goals   Additional Short Term Goals Yes      SLP SHORT TERM GOAL #6   Title Pt will complete basic sentence level reading comprehension tasks with 80% acc with provided mi/mod cues.    Baseline 40%    Time 4    Period Weeks    Status Achieved    Target Date 11/01/20      SLP SHORT TERM GOAL #7   Title Pt will answer moderate level auditory comprehension yes/no questions with 80% acc when provided mi/mod cues.    Baseline 60%    Time 4    Period Weeks    Status Achieved    Target Date 11/01/20      SLP SHORT TERM GOAL #8   Title Pt will increase reading comprehension for paragraph length material via multiple choice responses to 90% acc with min assist.    Baseline 75% for paragraphs and mi/mod assist    Time 8    Period Weeks    Status New    Target Date 12/23/20      SLP  SHORT TERM GOAL  #  9   TITLE Pt will answer basic questions related to "who, what, when, where, why" with 90% acc after listenging to short story (~4-6 sentences) and provided repetition as needed.    Baseline 70% acc    Time 8    Period Weeks    Status New    Target Date 12/23/20              SLP Long Term Goals - 10/28/20 1422       SLP LONG TERM GOAL #1   Title Pt will communicate moderate level wants/needs/thoughts to family and friends with use of multimodality communication strategies as needed.    Status On-going              Plan - 10/28/20 1119     Clinical Impression Statement Pt was accompanied to therapy by her husband. They would like to decrease sessions to once per week for 6-8 weeks due to financial burden. SLP will provide extensive HEP for her to work on between sessions. Pt completed divergent naming tasks with 85% acc with min cues. She answered open-ended questions with 90% acc when provided mod cues for personal likes and dislikes. The personalized communication template was updated and provided to Pt to practice reading aloud at home and copying high frequency words. Pt continues to make excellent progress, however mild/mod expressive and receptive aphasia persist. She will continue to benefit from skilled outpatient SLP services to increase independence with communication. It would be difficult for Pt to return to work given her current expressive and receptive aphasia. SLP also suggested that she follow up with a neurologist, as that was recommended by the neurologist in the acute setting. She will see her PCP at the end of this month. She also states that she completed the hear monitoring, but did not yet send it back in. Recommend SLP therapy 1x/week for 6-8 weeks.    Speech Therapy Frequency 1x /week    Duration 8 weeks   8 weeks   Treatment/Interventions SLP instruction and feedback;Compensatory strategies;Compensatory techniques;Patient/family  education;Language facilitation;Multimodal communcation approach;Cueing hierarchy    Potential to Achieve Goals Good    Potential Considerations Financial resources    SLP Home Exercise Plan Pt will completed HEP as assigned to facilitate carryover of treatment strategies and techniques in home environment with use of written cues as needed.    Consulted and Agree with Plan of Care Patient             Patient will benefit from skilled therapeutic intervention in order to improve the following deficits and impairments:   Aphasia  Cognitive communication deficit    Problem List Patient Active Problem List   Diagnosis Date Noted   Acute ischemic stroke ([place]) 09/16/2020   Essential hypertension 09/16/2020   Chronic back pain 09/16/2020   GERD (gastroesophageal reflux disease) 09/16/2020   Hyperlipidemia 09/16/2020   Dyspepsia    Family history of colon cancer 02/14/2017   Abdominal pain 02/14/2017   Abdominal pain, epigastric 07/11/2012   Abdominal pain, left upper quadrant 07/11/2012   Family history of malignant neoplasm of gastrointestinal tract 07/11/2012   Thank you,  Genene Churn, [place]  Genene Churn 10/28/2020, 2:23 PM  [place], [place], [address] Phone: [phone]   Fax:  [phone]   Name: Erika Ruiz MRN: [id number] Date of Birth: 30-Jan-1966

## 2020-11-11 ENCOUNTER — Ambulatory Visit (HOSPITAL_COMMUNITY): Payer: Commercial Managed Care - PPO | Admitting: Speech Pathology

## 2020-11-11 ENCOUNTER — Encounter (HOSPITAL_COMMUNITY): Payer: Self-pay | Admitting: Speech Pathology

## 2020-11-11 ENCOUNTER — Other Ambulatory Visit: Payer: Self-pay

## 2020-11-11 DIAGNOSIS — R4701 Aphasia: Secondary | ICD-10-CM | POA: Diagnosis not present

## 2020-11-11 DIAGNOSIS — R41841 Cognitive communication deficit: Secondary | ICD-10-CM

## 2020-11-11 NOTE — Therapy (Signed)
[place], [place], [address] Phone: [phone]   Fax:  [phone]  Speech Language Pathology Treatment  Patient Details  Name: Erika Ruiz MRN: [id number] Date of Birth: Nov 01, 1965 Referring Provider (SLP): Orson Eva, MD   Encounter Date: 11/11/2020   End of Session - 11/11/20 1057     Visit Number 10    Number of Visits 15    Date for SLP Re-Evaluation 12/23/20    Authorization Type [employer] PPO   $50 copay, 25th visit review   SLP Start Time 1030    SLP Stop Time  1115    SLP Time Calculation (min) 45 min    Activity Tolerance Patient tolerated treatment well             Past Medical History:  Diagnosis Date   Hyperlipemia    Hypertension    Uterine cancer ([place]) 1987   Vitamin D deficiency disease     Past Surgical History:  Procedure Laterality Date   ABDOMINAL HYSTERECTOMY     due to CA   BIOPSY  03/13/2017   Procedure: BIOPSY;  Surgeon: Danie Binder, MD;  [facility];  Service: Endoscopy;;  gastric   BREAST LUMPECTOMY Left    COLONOSCOPY WITH PROPOFOL N/A 03/13/2017   Procedure: COLONOSCOPY WITH PROPOFOL;  Surgeon: Danie Binder, MD;  [facility];  Service: Endoscopy;  Laterality: N/A;  11:00am   ESOPHAGOGASTRODUODENOSCOPY (EGD) WITH PROPOFOL N/A 03/13/2017   Procedure: ESOPHAGOGASTRODUODENOSCOPY (EGD) WITH PROPOFOL;  Surgeon: Danie Binder, MD;  [facility];  Service: Endoscopy;  Laterality: N/A;   LEFT OOPHORECTOMY     POLYPECTOMY  03/13/2017   Procedure: POLYPECTOMY;  Surgeon: Danie Binder, MD;  [facility];  Service: Endoscopy;;  colon   VAGINAL DELIVERY     X2    There were no vitals filed for this visit.   Subjective Assessment - 11/11/20 1047     Subjective "we did that"    Patient is accompained by: Family member    Currently in Pain? No/denies                   ADULT SLP TREATMENT - 11/11/20 1056        General Information   Behavior/Cognition Alert;Cooperative;Pleasant mood    Patient Positioning Upright in chair    Oral care provided N/A    HPI 55 year old female with a history of hypertension, hyperlipidemia, chronic back pain, and GERD presenting with word finding difficulty.  The patient was last known normal on 09/13/2020.  Her symptoms began with a left-sided frontal headache on 09/13/20. This persisted into 09/14/2020.  Her spouse noted the patient to be "confused" on 09/14/2020 with difficulty finishing her sentences.  The patient had some word finding difficulty which persisted into 09/15/2020 and she was admitted to [facility]. MRI brain--acute/subacute L-MCA infarct; remote bilateral infarct corona radiata,left caudate and pons. She was discharged home on 09/17/20 with referral for outpatient SLP therapy due to aphasia s/p CVA.      Treatment Provided   Treatment provided Cognitive-Linquistic      Pain Assessment   Pain Assessment No/denies pain      Cognitive-Linquistic Treatment   Treatment focused on Aphasia;Patient/family/caregiver education    Skilled Treatment SLP provided skilled treatment targeting word finding strategies, confrontation naming, and expanding upon utterances.     Assessment / Recommendations / Plan   Plan Continue with current plan  of care      Progression Toward Goals   Progression toward goals Progressing toward goals                SLP Short Term Goals - 11/11/20 1058       SLP SHORT TERM GOAL #1   Title Pt will increase naming of high and low frequency objects/pictures to 80% acc when provided with min multimodality cues.    Baseline 90% acc for high frequency words    Time 8    Period Weeks    Status On-going    Target Date 12/23/20      SLP SHORT TERM GOAL #2   Title Pt will describe objects and pictures by providing at least three salient features as judged by clinician with 90% acc when provided min cues.    Baseline 75% acc    Time 4     Period Weeks    Status Achieved    Target Date 11/01/20      SLP SHORT TERM GOAL #3   Title Pt will increase divergent naming to 8+ items per concrete category with mod cues.    Baseline 5    Time 8    Period Weeks    Status On-going    Target Date 12/23/20      SLP SHORT TERM GOAL #4   Title Pt will respond to open-ended questions using a complete sentence with 90% acc and min cues.    Baseline 80% with min cues as of 10/28/20    Time 8    Period Weeks    Status On-going    Target Date 12/23/20      SLP SHORT TERM GOAL #5   Title Pt will verbalize 7+ word sentence using subject, verb, object format when describing pictures with 90% acc with min cues    Baseline 50%    Time 4    Period Weeks    Status Achieved    Target Date 11/01/20      SLP SHORT TERM GOAL #6   Title Pt will complete basic sentence level reading comprehension tasks with 80% acc with provided mi/mod cues.    Baseline 40%    Time 4    Period Weeks    Status Achieved    Target Date 11/01/20      SLP SHORT TERM GOAL #7   Title Pt will answer moderate level auditory comprehension yes/no questions with 80% acc when provided mi/mod cues.    Baseline 60%    Time 4    Period Weeks    Status Achieved    Target Date 11/01/20      SLP SHORT TERM GOAL #8   Title Pt will increase reading comprehension for paragraph length material via multiple choice responses to 90% acc with min assist.    Baseline 75% for paragraphs and mi/mod assist    Time 8    Period Weeks    Status On-going    Target Date 12/23/20      SLP SHORT TERM GOAL  #9   TITLE Pt will answer basic questions related to "who, what, when, where, why" with 90% acc after listenging to short story (~4-6 sentences) and provided repetition as needed.    Baseline 70% acc    Time 8    Period Weeks    Status On-going    Target Date 12/23/20              SLP Long Term Goals -  11/11/20 1059       SLP LONG TERM GOAL #1   Title Pt will  communicate moderate level wants/needs/thoughts to family and friends with use of multimodality communication strategies as needed.    Status On-going              Plan - 11/11/20 1057     Clinical Impression Statement Pt was accompanied to therapy by her husband. She completed HEP as assigned and saw the neurologist last week. Pt completed confrontation naming tasks for low frequency pictures ([place], avocado, stump) with 100% acc with min cues. She completed responsive naming task with 100% acc with indirect cues. She described pictures with min cues to expand upon utterances with 100% acc with self corrections for semantic paraphasic errors (apple juice for orange juice). She independently used word finding strategies to help elicit word production 5x. Pt continues to make excellent progress, however mild/mod expressive and receptive aphasia persist.    Speech Therapy Frequency 1x /week    Duration 8 weeks   8 weeks   Treatment/Interventions SLP instruction and feedback;Compensatory strategies;Compensatory techniques;Patient/family education;Language facilitation;Multimodal communcation approach;Cueing hierarchy    Potential to Achieve Goals Good    Potential Considerations Financial resources    SLP Home Exercise Plan Pt will completed HEP as assigned to facilitate carryover of treatment strategies and techniques in home environment with use of written cues as needed.    Consulted and Agree with Plan of Care Patient             Patient will benefit from skilled therapeutic intervention in order to improve the following deficits and impairments:   Aphasia  Cognitive communication deficit    Problem List Patient Active Problem List   Diagnosis Date Noted   Acute ischemic stroke ([place]) 09/16/2020   Essential hypertension 09/16/2020   Chronic back pain 09/16/2020   GERD (gastroesophageal reflux disease) 09/16/2020   Hyperlipidemia 09/16/2020   Dyspepsia    Family history  of colon cancer 02/14/2017   Abdominal pain 02/14/2017   Abdominal pain, epigastric 07/11/2012   Abdominal pain, left upper quadrant 07/11/2012   Family history of malignant neoplasm of gastrointestinal tract 07/11/2012   Thank you,  Genene Churn, [place]  Quintin Hjort 11/11/2020, 11:03 AM  [place], [place], [address] Phone: [phone]   Fax:  [phone]   Name: THANA RAMP MRN: [id number] Date of Birth: [birth year]

## 2020-11-18 ENCOUNTER — Ambulatory Visit (HOSPITAL_COMMUNITY): Payer: Commercial Managed Care - PPO | Attending: Internal Medicine | Admitting: Speech Pathology

## 2020-11-18 ENCOUNTER — Encounter (HOSPITAL_COMMUNITY): Payer: Self-pay | Admitting: Speech Pathology

## 2020-11-18 ENCOUNTER — Other Ambulatory Visit: Payer: Self-pay

## 2020-11-18 DIAGNOSIS — R4701 Aphasia: Secondary | ICD-10-CM | POA: Diagnosis present

## 2020-11-18 DIAGNOSIS — R41841 Cognitive communication deficit: Secondary | ICD-10-CM | POA: Diagnosis present

## 2020-11-18 NOTE — Therapy (Signed)
[place], [place], [address] Phone: [phone]   Fax:  [phone]  Speech Language Pathology Treatment  Patient Details  Name: Erika Ruiz MRN: [id number] Date of Birth: Oct 12, 1965 Referring Provider (SLP): Orson Eva, MD   Encounter Date: 11/18/2020   End of Session - 11/18/20 1627     Visit Number 11    Number of Visits 15    Date for SLP Re-Evaluation 12/23/20    Authorization Type [employer] PPO   $50 copay, 25th visit review   SLP Start Time 1030    SLP Stop Time  1115    SLP Time Calculation (min) 45 min    Activity Tolerance Patient tolerated treatment well             Past Medical History:  Diagnosis Date   Hyperlipemia    Hypertension    Uterine cancer ([place]) 1987   Vitamin D deficiency disease     Past Surgical History:  Procedure Laterality Date   ABDOMINAL HYSTERECTOMY     due to CA   BIOPSY  03/13/2017   Procedure: BIOPSY;  Surgeon: Danie Binder, MD;  [facility];  Service: Endoscopy;;  gastric   BREAST LUMPECTOMY Left    COLONOSCOPY WITH PROPOFOL N/A 03/13/2017   Procedure: COLONOSCOPY WITH PROPOFOL;  Surgeon: Danie Binder, MD;  [facility];  Service: Endoscopy;  Laterality: N/A;  11:00am   ESOPHAGOGASTRODUODENOSCOPY (EGD) WITH PROPOFOL N/A 03/13/2017   Procedure: ESOPHAGOGASTRODUODENOSCOPY (EGD) WITH PROPOFOL;  Surgeon: Danie Binder, MD;  [facility];  Service: Endoscopy;  Laterality: N/A;   LEFT OOPHORECTOMY     POLYPECTOMY  03/13/2017   Procedure: POLYPECTOMY;  Surgeon: Danie Binder, MD;  [facility];  Service: Endoscopy;;  colon   VAGINAL DELIVERY     X2    There were no vitals filed for this visit.   Subjective Assessment - 11/18/20 1103     Subjective "Sometimes I have trouble praying."    Patient is accompained by: Family member    Currently in Pain? No/denies                   ADULT SLP  TREATMENT - 11/18/20 1111       General Information   Behavior/Cognition Alert;Cooperative;Pleasant mood    Patient Positioning Upright in chair    Oral care provided N/A    HPI 55 year old female with a history of hypertension, hyperlipidemia, chronic back pain, and GERD presenting with word finding difficulty.  The patient was last known normal on 09/13/2020.  Her symptoms began with a left-sided frontal headache on 09/13/20. This persisted into 09/14/2020.  Her spouse noted the patient to be "confused" on 09/14/2020 with difficulty finishing her sentences.  The patient had some word finding difficulty which persisted into 09/15/2020 and she was admitted to [facility]. MRI brain--acute/subacute L-MCA infarct; remote bilateral infarct corona radiata,left caudate and pons. She was discharged home on 09/17/20 with referral for outpatient SLP therapy due to aphasia s/p CVA.      Treatment Provided   Treatment provided Cognitive-Linquistic      Pain Assessment   Pain Assessment No/denies pain      Cognitive-Linquistic Treatment   Treatment focused on Aphasia;Patient/family/caregiver education    Skilled Treatment SLP provided skilled treatment targeting word finding strategies, confrontation naming, reading comprehension, expanding upon utterances, and providing appropriate cues via written prompts, single word sentence completion, and phonemic  cues.      Assessment / Recommendations / Plan   Plan Continue with current plan of care              SLP Education - 11/18/20 1627     Education Details Provided written expression homework    Person(s) Educated Patient;Spouse    Methods Explanation;Handout    Comprehension Verbalized understanding              SLP Short Term Goals - 11/18/20 1638       SLP SHORT TERM GOAL #1   Title Pt will increase naming of high and low frequency objects/pictures to 80% acc when provided with min multimodality cues.    Baseline 90% acc for high frequency words     Time 8    Period Weeks    Status On-going    Target Date 12/23/20      SLP SHORT TERM GOAL #2   Title Pt will describe objects and pictures by providing at least three salient features as judged by clinician with 90% acc when provided min cues.    Baseline 75% acc    Time 4    Period Weeks    Status Achieved    Target Date 11/01/20      SLP SHORT TERM GOAL #3   Title Pt will increase divergent naming to 8+ items per concrete category with mod cues.    Baseline 5    Time 8    Period Weeks    Status On-going    Target Date 12/23/20      SLP SHORT TERM GOAL #4   Title Pt will respond to open-ended questions using a complete sentence with 90% acc and min cues.    Baseline 80% with min cues as of 10/28/20    Time 8    Period Weeks    Status On-going    Target Date 12/23/20      SLP SHORT TERM GOAL #5   Title Pt will verbalize 7+ word sentence using subject, verb, object format when describing pictures with 90% acc with min cues    Baseline 50%    Time 4    Period Weeks    Status Achieved    Target Date 11/01/20      SLP SHORT TERM GOAL #6   Title Pt will complete basic sentence level reading comprehension tasks with 80% acc with provided mi/mod cues.    Baseline 40%    Time 4    Period Weeks    Status Achieved    Target Date 11/01/20      SLP SHORT TERM GOAL #7   Title Pt will answer moderate level auditory comprehension yes/no questions with 80% acc when provided mi/mod cues.    Baseline 60%    Time 4    Period Weeks    Status Achieved    Target Date 11/01/20      SLP SHORT TERM GOAL #8   Title Pt will increase reading comprehension for paragraph length material via multiple choice responses to 90% acc with min assist.    Baseline 75% for paragraphs and mi/mod assist    Time 8    Period Weeks    Status On-going    Target Date 12/23/20      SLP SHORT TERM GOAL  #9   TITLE Pt will answer basic questions related to "who, what, when, where, why" with 90% acc after  listenging to short story (~4-6 sentences) and provided repetition  as needed.    Baseline 70% acc    Time 8    Period Weeks    Status On-going    Target Date 12/23/20              SLP Long Term Goals - 11/18/20 1639       SLP LONG TERM GOAL #1   Title Pt will communicate moderate level wants/needs/thoughts to family and friends with use of multimodality communication strategies as needed.    Status On-going              Plan - 11/18/20 1631     Clinical Impression Statement Pt was accompanied to therapy by her husband. HEP was completed and Pt and spouse continue to report improvement in language at home. Pt expressed frustration that she has a difficult time praying. In session, she was asked to write down what she might typically say. She fairly easily was able to write what she routinely says, but had trouble writing and articulating novel additions (regarding prayers for her continued language progress). SLP wrote down key words (language, comprehension, word finding) and then assisted Pt in generating sentences. Oral reading continues to improve and Pt required only indirect to min cues to decrease rate when words were skipped. She answered comprehension questions regarding the text with 90% acc with cue for error awareness. Continue targeting expressive and receptive goals next session.    Speech Therapy Frequency 1x /week    Duration 4 weeks   8 weeks   Treatment/Interventions SLP instruction and feedback;Compensatory strategies;Compensatory techniques;Patient/family education;Language facilitation;Multimodal communcation approach;Cueing hierarchy    Potential to Achieve Goals Good    Potential Considerations Financial resources    SLP Home Exercise Plan Pt will completed HEP as assigned to facilitate carryover of treatment strategies and techniques in home environment with use of written cues as needed.    Consulted and Agree with Plan of Care Patient              Patient will benefit from skilled therapeutic intervention in order to improve the following deficits and impairments:   Cognitive communication deficit  Aphasia    Problem List Patient Active Problem List   Diagnosis Date Noted   Acute ischemic stroke ([place]) 09/16/2020   Essential hypertension 09/16/2020   Chronic back pain 09/16/2020   GERD (gastroesophageal reflux disease) 09/16/2020   Hyperlipidemia 09/16/2020   Dyspepsia    Family history of colon cancer 02/14/2017   Abdominal pain 02/14/2017   Abdominal pain, epigastric 07/11/2012   Abdominal pain, left upper quadrant 07/11/2012   Family history of malignant neoplasm of gastrointestinal tract 07/11/2012   Thank you,  Genene Churn, [place]  Adilenne Ashworth 11/18/2020, 4:39 PM  [place], [place], [address] Phone: [phone]   Fax:  [phone]   Name: ALIVIYAH MALANGA MRN: [id number] Date of Birth: 1966/02/25

## 2020-11-25 ENCOUNTER — Ambulatory Visit (HOSPITAL_COMMUNITY): Payer: Commercial Managed Care - PPO | Admitting: Speech Pathology

## 2020-12-02 ENCOUNTER — Other Ambulatory Visit: Payer: Self-pay

## 2020-12-02 ENCOUNTER — Ambulatory Visit (HOSPITAL_COMMUNITY): Payer: Commercial Managed Care - PPO | Admitting: Speech Pathology

## 2020-12-02 ENCOUNTER — Encounter (HOSPITAL_COMMUNITY): Payer: Self-pay | Admitting: Speech Pathology

## 2020-12-02 DIAGNOSIS — R41841 Cognitive communication deficit: Secondary | ICD-10-CM

## 2020-12-02 DIAGNOSIS — R4701 Aphasia: Secondary | ICD-10-CM

## 2020-12-02 NOTE — Therapy (Signed)
[place], [place], [address] Phone: [phone]   Fax:  [phone]  Speech Language Pathology Treatment  Patient Details  Name: Erika Ruiz MRN: [id number] Date of Birth: [birth year] Referring Provider (SLP): Orson Eva, MD   Encounter Date: 12/02/2020   End of Session - 12/02/20 1517     Visit Number 12    Number of Visits 15    Date for SLP Re-Evaluation 12/23/20    Authorization Type [employer] PPO   $50 copay, 25th visit review   SLP Start Time 1048    SLP Stop Time  1130    SLP Time Calculation (min) 42 min    Activity Tolerance Patient tolerated treatment well             Past Medical History:  Diagnosis Date   Hyperlipemia    Hypertension    Uterine cancer ([place]) 1987   Vitamin D deficiency disease     Past Surgical History:  Procedure Laterality Date   ABDOMINAL HYSTERECTOMY     due to CA   BIOPSY  03/13/2017   Procedure: BIOPSY;  Surgeon: Danie Binder, MD;  [facility];  Service: Endoscopy;;  gastric   BREAST LUMPECTOMY Left    COLONOSCOPY WITH PROPOFOL N/A 03/13/2017   Procedure: COLONOSCOPY WITH PROPOFOL;  Surgeon: Danie Binder, MD;  [facility];  Service: Endoscopy;  Laterality: N/A;  11:00am   ESOPHAGOGASTRODUODENOSCOPY (EGD) WITH PROPOFOL N/A 03/13/2017   Procedure: ESOPHAGOGASTRODUODENOSCOPY (EGD) WITH PROPOFOL;  Surgeon: Danie Binder, MD;  [facility];  Service: Endoscopy;  Laterality: N/A;   LEFT OOPHORECTOMY     POLYPECTOMY  03/13/2017   Procedure: POLYPECTOMY;  Surgeon: Danie Binder, MD;  [facility];  Service: Endoscopy;;  colon   VAGINAL DELIVERY     X2    There were no vitals filed for this visit.   Subjective Assessment - 12/02/20 1108     Subjective "I got a book from [employer]."    Patient is accompained by: Family member    Currently in Pain? No/denies                   ADULT SLP  TREATMENT - 12/02/20 1121       General Information   Behavior/Cognition Alert;Cooperative;Pleasant mood    Patient Positioning Upright in chair    Oral care provided N/A    HPI 55 year old female with a history of hypertension, hyperlipidemia, chronic back pain, and GERD presenting with word finding difficulty.  The patient was last known normal on 09/13/2020.  Her symptoms began with a left-sided frontal headache on 09/13/20. This persisted into 09/14/2020.  Her spouse noted the patient to be "confused" on 09/14/2020 with difficulty finishing her sentences.  The patient had some word finding difficulty which persisted into 09/15/2020 and she was admitted to [facility]. MRI brain--acute/subacute L-MCA infarct; remote bilateral infarct corona radiata,left caudate and pons. She was discharged home on 09/17/20 with referral for outpatient SLP therapy due to aphasia s/p CVA.      Treatment Provided   Treatment provided Cognitive-Linquistic      Pain Assessment   Pain Assessment No/denies pain      Cognitive-Linquistic Treatment   Treatment focused on Aphasia;Patient/family/caregiver education    Skilled Treatment SLP provided skilled treatment targeting word finding strategies, confrontation naming, reading comprehension, expanding upon utterances, and providing appropriate cues via written prompts, single word sentence completion,  and phonemic cues.      Assessment / Recommendations / Plan   Plan Continue with current plan of care      Progression Toward Goals   Progression toward goals Progressing toward goals              SLP Education - 12/02/20 1514     Education Details Recommended Pt try the [employer] Language Lite app at home after reviewing in session today    Person(s) Educated Patient;Spouse    Methods Demonstration;Explanation    Comprehension Verbalized understanding              SLP Short Term Goals - 12/02/20 1529       SLP SHORT TERM GOAL #1   Title Pt will increase  naming of high and low frequency objects/pictures to 80% acc when provided with min multimodality cues.    Baseline 90% acc for high frequency words    Time 8    Period Weeks    Status On-going    Target Date 12/23/20      SLP SHORT TERM GOAL #2   Title Pt will describe objects and pictures by providing at least three salient features as judged by clinician with 90% acc when provided min cues.    Baseline 75% acc    Time 4    Period Weeks    Status Achieved    Target Date 11/01/20      SLP SHORT TERM GOAL #3   Title Pt will increase divergent naming to 8+ items per concrete category with mod cues.    Baseline 5    Time 8    Period Weeks    Status On-going    Target Date 12/23/20      SLP SHORT TERM GOAL #4   Title Pt will respond to open-ended questions using a complete sentence with 90% acc and min cues.    Baseline 80% with min cues as of 10/28/20    Time 8    Period Weeks    Status On-going    Target Date 12/23/20      SLP SHORT TERM GOAL #5   Title Pt will verbalize 7+ word sentence using subject, verb, object format when describing pictures with 90% acc with min cues    Baseline 50%    Time 4    Period Weeks    Status Achieved    Target Date 11/01/20      SLP SHORT TERM GOAL #6   Title Pt will complete basic sentence level reading comprehension tasks with 80% acc with provided mi/mod cues.    Baseline 40%    Time 4    Period Weeks    Status Achieved    Target Date 11/01/20      SLP SHORT TERM GOAL #7   Title Pt will answer moderate level auditory comprehension yes/no questions with 80% acc when provided mi/mod cues.    Baseline 60%    Time 4    Period Weeks    Status Achieved    Target Date 11/01/20      SLP SHORT TERM GOAL #8   Title Pt will increase reading comprehension for paragraph length material via multiple choice responses to 90% acc with min assist.    Baseline 75% for paragraphs and mi/mod assist    Time 8    Period Weeks    Status On-going     Target Date 12/23/20      SLP SHORT TERM GOAL  #  9   TITLE Pt will answer basic questions related to "who, what, when, where, why" with 90% acc after listenging to short story (~4-6 sentences) and provided repetition as needed.    Baseline 70% acc    Time 8    Period Weeks    Status On-going    Target Date 12/23/20              SLP Long Term Goals - 12/02/20 1529       SLP LONG TERM GOAL #1   Title Pt will communicate moderate level wants/needs/thoughts to family and friends with use of multimodality communication strategies as needed.    Status On-going              Plan - 12/02/20 1518     Clinical Impression Statement Pt was accompanied to therapy by her husband. Both indicate continued improvements in language skills at home. She still sometimes defers to her husband to explain complex thoughts (they had problems with the septic tank), however SLP encouraged her to verbalize without assist first. She completed moderately complex auditory comprehension task on the [employer] app with 90% acc when allowed for repetition and slower rate. She completed paragraph reading comprehension activity with 90% acc when allowed to review the written text. She responded to open ended questions using a complete sentence with 95% acc when provided min cues. While she is making excellent progress, she is unlikely to be able to return to work in her previous capacity unless duties are greatly modified due to the severity of her language deficits. She was advised to discuss with her doctor. Continue to target goals and reasses for needs next session.    Speech Therapy Frequency 1x /week    Duration 4 weeks   8 weeks   Treatment/Interventions SLP instruction and feedback;Compensatory strategies;Compensatory techniques;Patient/family education;Language facilitation;Multimodal communcation approach;Cueing hierarchy    Potential to Achieve Goals Good    Potential Considerations Financial resources     SLP Home Exercise Plan Pt will completed HEP as assigned to facilitate carryover of treatment strategies and techniques in home environment with use of written cues as needed.    Consulted and Agree with Plan of Care Patient             Patient will benefit from skilled therapeutic intervention in order to improve the following deficits and impairments:   Aphasia  Cognitive communication deficit    Problem List Patient Active Problem List   Diagnosis Date Noted   Acute ischemic stroke ([place]) 09/16/2020   Essential hypertension 09/16/2020   Chronic back pain 09/16/2020   GERD (gastroesophageal reflux disease) 09/16/2020   Hyperlipidemia 09/16/2020   Dyspepsia    Family history of colon cancer 02/14/2017   Abdominal pain 02/14/2017   Abdominal pain, epigastric 07/11/2012   Abdominal pain, left upper quadrant 07/11/2012   Family history of malignant neoplasm of gastrointestinal tract 07/11/2012   Thank you,  Genene Churn, [place]  Conlin Brahm 12/02/2020, 3:30 PM  [place], [place], [address] Phone: [phone]   Fax:  [phone]   Name: Erika Ruiz MRN: [id number] Date of Birth: [birth year]

## 2020-12-09 ENCOUNTER — Other Ambulatory Visit: Payer: Self-pay

## 2020-12-09 ENCOUNTER — Encounter (HOSPITAL_COMMUNITY): Payer: Self-pay | Admitting: Speech Pathology

## 2020-12-09 ENCOUNTER — Ambulatory Visit (HOSPITAL_COMMUNITY): Payer: Commercial Managed Care - PPO | Admitting: Speech Pathology

## 2020-12-09 DIAGNOSIS — R41841 Cognitive communication deficit: Secondary | ICD-10-CM | POA: Diagnosis not present

## 2020-12-09 DIAGNOSIS — R4701 Aphasia: Secondary | ICD-10-CM

## 2020-12-09 NOTE — Therapy (Signed)
[place], [place], [address] Phone: [phone]   Fax:  [phone]  Speech Language Pathology Treatment  Patient Details  Name: Erika Ruiz MRN: [id number] Date of Birth: 1966/03/24 Referring Provider (SLP): Orson Eva, MD   Encounter Date: 12/09/2020   End of Session - 12/09/20 1114     Visit Number 13    Number of Visits 15    Date for SLP Re-Evaluation 12/23/20    Authorization Type [employer] PPO   $50 copay, 25th visit review   SLP Start Time 1035    SLP Stop Time  1120    SLP Time Calculation (min) 45 min    Activity Tolerance Patient tolerated treatment well             Past Medical History:  Diagnosis Date   Hyperlipemia    Hypertension    Uterine cancer ([place]) 1987   Vitamin D deficiency disease     Past Surgical History:  Procedure Laterality Date   ABDOMINAL HYSTERECTOMY     due to CA   BIOPSY  03/13/2017   Procedure: BIOPSY;  Surgeon: Danie Binder, MD;  [facility];  Service: Endoscopy;;  gastric   BREAST LUMPECTOMY Left    COLONOSCOPY WITH PROPOFOL N/A 03/13/2017   Procedure: COLONOSCOPY WITH PROPOFOL;  Surgeon: Danie Binder, MD;  [facility];  Service: Endoscopy;  Laterality: N/A;  11:00am   ESOPHAGOGASTRODUODENOSCOPY (EGD) WITH PROPOFOL N/A 03/13/2017   Procedure: ESOPHAGOGASTRODUODENOSCOPY (EGD) WITH PROPOFOL;  Surgeon: Danie Binder, MD;  [facility];  Service: Endoscopy;  Laterality: N/A;   LEFT OOPHORECTOMY     POLYPECTOMY  03/13/2017   Procedure: POLYPECTOMY;  Surgeon: Danie Binder, MD;  [facility];  Service: Endoscopy;;  colon   VAGINAL DELIVERY     X2    There were no vitals filed for this visit.   Subjective Assessment - 12/09/20 1104     Subjective "I have been doing some reading on the phone."    Patient is accompained by: Family member    Currently in Pain? No/denies               ADULT SLP  TREATMENT - 12/09/20 1107       General Information   Behavior/Cognition Alert;Cooperative;Pleasant mood    Patient Positioning Upright in chair    Oral care provided N/A    HPI 55 year old female with a history of hypertension, hyperlipidemia, chronic back pain, and GERD presenting with word finding difficulty.  The patient was last known normal on 09/13/2020.  Her symptoms began with a left-sided frontal headache on 09/13/20. This persisted into 09/14/2020.  Her spouse noted the patient to be "confused" on 09/14/2020 with difficulty finishing her sentences.  The patient had some word finding difficulty which persisted into 09/15/2020 and she was admitted to [facility]. MRI brain--acute/subacute L-MCA infarct; remote bilateral infarct corona radiata,left caudate and pons. She was discharged home on 09/17/20 with referral for outpatient SLP therapy due to aphasia s/p CVA.      Treatment Provided   Treatment provided Cognitive-Linquistic      Pain Assessment   Pain Assessment No/denies pain      Cognitive-Linquistic Treatment   Treatment focused on Aphasia;Patient/family/caregiver education    Skilled Treatment SLP provided skilled treatment targeting word finding strategies, confrontation naming, reading comprehension, expanding upon utterances, and providing appropriate cues via written prompts, single word sentence completion, and phonemic  cues.      Assessment / Recommendations / Plan   Plan Continue with current plan of care      Progression Toward Goals   Progression toward goals Progressing toward goals                SLP Short Term Goals - 12/09/20 1124       SLP SHORT TERM GOAL #1   Title Pt will increase naming of high and low frequency objects/pictures to 80% acc when provided with min multimodality cues.    Baseline 90% acc for high frequency words    Time 8    Period Weeks    Status On-going    Target Date 12/23/20      SLP SHORT TERM GOAL #2   Title Pt will describe objects and  pictures by providing at least three salient features as judged by clinician with 90% acc when provided min cues.    Baseline 75% acc    Time 4    Period Weeks    Status Achieved    Target Date 11/01/20      SLP SHORT TERM GOAL #3   Title Pt will increase divergent naming to 8+ items per concrete category with mod cues.    Baseline 5    Time 8    Period Weeks    Status On-going    Target Date 12/23/20      SLP SHORT TERM GOAL #4   Title Pt will respond to open-ended questions using a complete sentence with 90% acc and min cues.    Baseline 80% with min cues as of 10/28/20    Time 8    Period Weeks    Status On-going    Target Date 12/23/20      SLP SHORT TERM GOAL #5   Title Pt will verbalize 7+ word sentence using subject, verb, object format when describing pictures with 90% acc with min cues    Baseline 50%    Time 4    Period Weeks    Status Achieved    Target Date 11/01/20      SLP SHORT TERM GOAL #6   Title Pt will complete basic sentence level reading comprehension tasks with 80% acc with provided mi/mod cues.    Baseline 40%    Time 4    Period Weeks    Status Achieved    Target Date 11/01/20      SLP SHORT TERM GOAL #7   Title Pt will answer moderate level auditory comprehension yes/no questions with 80% acc when provided mi/mod cues.    Baseline 60%    Time 4    Period Weeks    Status Achieved    Target Date 11/01/20      SLP SHORT TERM GOAL #8   Title Pt will increase reading comprehension for paragraph length material via multiple choice responses to 90% acc with min assist.    Baseline 75% for paragraphs and mi/mod assist    Time 8    Period Weeks    Status On-going    Target Date 12/23/20      SLP SHORT TERM GOAL  #9   TITLE Pt will answer basic questions related to "who, what, when, where, why" with 90% acc after listenging to short story (~4-6 sentences) and provided repetition as needed.    Baseline 70% acc    Time 8    Period Weeks     Status On-going    Target  Date 12/23/20              SLP Long Term Goals - 12/09/20 1126       SLP LONG TERM GOAL #1   Title Pt will communicate moderate level wants/needs/thoughts to family and friends with use of multimodality communication strategies as needed.    Status On-going              Plan - 12/09/20 1116     Clinical Impression Statement Pt was accompanied to therapy by her husband. Both report ongoing progress in her expressive and receptive language skills. She has been accessing extra homework on the [employer] aphasia app. She was able to label pictures, identify verbs, and create multiple sentences with each word with min assist. She answered moderately complex auditory comprehension tasks with 95% acc when allowed for repetition. Continue to target goals and reasses for needs next session.    Speech Therapy Frequency 1x /week    Duration 4 weeks   8 weeks   Treatment/Interventions SLP instruction and feedback;Compensatory strategies;Compensatory techniques;Patient/family education;Language facilitation;Multimodal communcation approach;Cueing hierarchy    Potential to Achieve Goals Good    Potential Considerations Financial resources    SLP Home Exercise Plan Pt will completed HEP as assigned to facilitate carryover of treatment strategies and techniques in home environment with use of written cues as needed.    Consulted and Agree with Plan of Care Patient             Patient will benefit from skilled therapeutic intervention in order to improve the following deficits and impairments:   Aphasia  Cognitive communication deficit    Problem List Patient Active Problem List   Diagnosis Date Noted   Acute ischemic stroke ([place]) 09/16/2020   Essential hypertension 09/16/2020   Chronic back pain 09/16/2020   GERD (gastroesophageal reflux disease) 09/16/2020   Hyperlipidemia 09/16/2020   Dyspepsia    Family history of colon cancer 02/14/2017   Abdominal  pain 02/14/2017   Abdominal pain, epigastric 07/11/2012   Abdominal pain, left upper quadrant 07/11/2012   Family history of malignant neoplasm of gastrointestinal tract 07/11/2012   Thank you,  Genene Churn, [place]  [facility] 12/09/2020, 11:27 AM  [place], [place], [address] Phone: [phone]   Fax:  [phone]   Name: Erika Ruiz MRN: [id number] Date of Birth: 1966/02/25

## 2020-12-16 ENCOUNTER — Ambulatory Visit (HOSPITAL_COMMUNITY): Payer: Commercial Managed Care - PPO | Attending: Internal Medicine | Admitting: Speech Pathology

## 2020-12-16 ENCOUNTER — Other Ambulatory Visit: Payer: Self-pay

## 2020-12-16 ENCOUNTER — Encounter (HOSPITAL_COMMUNITY): Payer: Self-pay | Admitting: Speech Pathology

## 2020-12-16 DIAGNOSIS — R4701 Aphasia: Secondary | ICD-10-CM | POA: Insufficient documentation

## 2020-12-16 DIAGNOSIS — R41841 Cognitive communication deficit: Secondary | ICD-10-CM

## 2020-12-16 NOTE — Therapy (Signed)
[place], [place], [address] Phone: [phone]   Fax:  [phone]  Speech Language Pathology Treatment  Patient Details  Name: Erika Ruiz MRN: [id number] Date of Birth: 1965-11-12 Referring Provider (SLP): Orson Eva, MD   Encounter Date: 12/16/2020   End of Session - 12/16/20 1213     Visit Number 14    Number of Visits 16    Date for SLP Re-Evaluation 01/13/21    Authorization Type [employer] PPO   $50 copay, 25th visit review   SLP Start Time 1030    SLP Stop Time  1115    SLP Time Calculation (min) 45 min    Activity Tolerance Patient tolerated treatment well             Past Medical History:  Diagnosis Date   Hyperlipemia    Hypertension    Uterine cancer ([place]) 1987   Vitamin D deficiency disease     Past Surgical History:  Procedure Laterality Date   ABDOMINAL HYSTERECTOMY     due to CA   BIOPSY  03/13/2017   Procedure: BIOPSY;  Surgeon: Danie Binder, MD;  [facility];  Service: Endoscopy;;  gastric   BREAST LUMPECTOMY Left    COLONOSCOPY WITH PROPOFOL N/A 03/13/2017   Procedure: COLONOSCOPY WITH PROPOFOL;  Surgeon: Danie Binder, MD;  [facility];  Service: Endoscopy;  Laterality: N/A;  11:00am   ESOPHAGOGASTRODUODENOSCOPY (EGD) WITH PROPOFOL N/A 03/13/2017   Procedure: ESOPHAGOGASTRODUODENOSCOPY (EGD) WITH PROPOFOL;  Surgeon: Danie Binder, MD;  [facility];  Service: Endoscopy;  Laterality: N/A;   LEFT OOPHORECTOMY     POLYPECTOMY  03/13/2017   Procedure: POLYPECTOMY;  Surgeon: Danie Binder, MD;  [facility];  Service: Endoscopy;;  colon   VAGINAL DELIVERY     X2    There were no vitals filed for this visit.   Subjective Assessment - 12/16/20 1210     Subjective "I have been working on the app."    Patient is accompained by: Family member    Special Tests MS Aphasia screening test (MAST)    Currently in Pain?  No/denies                ADULT SLP TREATMENT - 12/16/20 1212       General Information   Behavior/Cognition Alert;Cooperative;Pleasant mood    Patient Positioning Upright in chair    Oral care provided N/A    HPI 55 year old female with a history of hypertension, hyperlipidemia, chronic back pain, and GERD presenting with word finding difficulty.  The patient was last known normal on 09/13/2020.  Her symptoms began with a left-sided frontal headache on 09/13/20. This persisted into 09/14/2020.  Her spouse noted the patient to be "confused" on 09/14/2020 with difficulty finishing her sentences.  The patient had some word finding difficulty which persisted into 09/15/2020 and she was admitted to [facility]. MRI brain--acute/subacute L-MCA infarct; remote bilateral infarct corona radiata,left caudate and pons. She was discharged home on 09/17/20 with referral for outpatient SLP therapy due to aphasia s/p CVA.      Treatment Provided   Treatment provided Cognitive-Linquistic      Pain Assessment   Pain Assessment No/denies pain      Cognitive-Linquistic Treatment   Treatment focused on Aphasia;Patient/family/caregiver education    Skilled Treatment SLP provided skilled treatment targeting word finding strategies, confrontation naming, reading comprehension, expanding upon utterances, and providing appropriate cues  via written prompts, single word sentence completion, and phonemic cues.      Assessment / Recommendations / Plan   Plan Continue with current plan of care      Progression Toward Goals   Progression toward goals Progressing toward goals                SLP Short Term Goals - 12/16/20 1214       SLP SHORT TERM GOAL #1   Title Pt will increase naming of high and low frequency objects/pictures to 80% acc when provided with min multimodality cues.    Baseline 90% acc for high frequency words    Time 8    Period Weeks    Status Achieved    Target Date 12/23/20      SLP SHORT TERM  GOAL #2   Title Pt will describe objects and pictures by providing at least three salient features as judged by clinician with 90% acc when provided min cues.    Baseline 75% acc    Time 4    Period Weeks    Status Achieved    Target Date 11/01/20      SLP SHORT TERM GOAL #3   Title Pt will increase divergent naming to 8+ items per concrete category with mod cues.    Baseline 5    Time 8    Period Weeks    Status Achieved    Target Date 12/23/20      SLP SHORT TERM GOAL #4   Title Pt will respond to open-ended questions using a complete sentence with 90% acc and min cues.    Baseline 80% with min cues as of 10/28/20    Time 8    Period Weeks    Status Achieved    Target Date 12/23/20      SLP SHORT TERM GOAL #5   Title Pt will verbalize 7+ word sentence using subject, verb, object format when describing pictures with 90% acc with min cues    Baseline 50%    Time 4    Period Weeks    Status Achieved    Target Date 11/01/20      SLP SHORT TERM GOAL #6   Title Pt will complete basic sentence level reading comprehension tasks with 80% acc with provided mi/mod cues.    Baseline 40%    Time 4    Period Weeks    Status Achieved    Target Date 11/01/20      SLP SHORT TERM GOAL #7   Title Pt will answer moderate level auditory comprehension yes/no questions with 80% acc when provided mi/mod cues.    Baseline 60%    Time 4    Period Weeks    Status Achieved    Target Date 11/01/20      SLP SHORT TERM GOAL #8   Title Pt will increase reading comprehension for paragraph length material via multiple choice responses to 90% acc with min assist.    Baseline 75% for paragraphs and mi/mod assist    Time 8    Period Weeks    Status On-going    Target Date 12/23/20      SLP SHORT TERM GOAL  #9   TITLE Pt will answer basic questions related to "who, what, when, where, why" with 90% acc after listenging to short story (~4-6 sentences) and provided repetition as needed.    Baseline  70% acc    Time 8    Period Weeks  Status On-going    Target Date 01/20/21      SLP SHORT TERM GOAL  #10   TITLE Pt will follow moderately complex auditory directions of up to 3 steps with repetition as needed with 90% acc and Pt to use compensatory strategies as needed (self written cues/repetition).    Baseline 80% mi/mod assist    Time 3    Period Weeks    Status New    Target Date 01/20/21              SLP Long Term Goals - 12/16/20 1325       SLP LONG TERM GOAL #1   Title Pt will communicate moderate level wants/needs/thoughts to family and friends with use of multimodality communication strategies as needed.    Status On-going              Plan - 12/16/20 1214     Clinical Impression Statement Pt was accompanied to therapy by her husband.  The MS Aphasia Screening tool was administered and results compared to 09/27/20 administration. Pt continues to make progress in all areas, however still has difficulty with word finding and comprehension. Pt was administered the [place] (MAST) and achieved an overall expressive score of 42/50 previously 25/50, receptive score of 44/50 previously 32/50, and a total score of 86/100 previously 57/100. In session, she wrote to dictation (phone numbers, appointment times, and grocery list) with 90% acc with cue for error awareness. She was given reading comprehension homework. Pt would like to attend two more sessions.   Speech Therapy Frequency 1x /week    Duration 4 weeks      Treatment/Interventions SLP instruction and feedback;Compensatory strategies;Compensatory techniques;Patient/family education;Language facilitation;Multimodal communcation approach;Cueing hierarchy    Potential to Achieve Goals Good    Potential Considerations Financial resources    SLP Home Exercise Plan Pt will completed HEP as assigned to facilitate carryover of treatment strategies and techniques in home environment with use of  written cues as needed.    Consulted and Agree with Plan of Care Patient             Patient will benefit from skilled therapeutic intervention in order to improve the following deficits and impairments:   Aphasia  Cognitive communication deficit    Problem List Patient Active Problem List   Diagnosis Date Noted   Acute ischemic stroke ([place]) 09/16/2020   Essential hypertension 09/16/2020   Chronic back pain 09/16/2020   GERD (gastroesophageal reflux disease) 09/16/2020   Hyperlipidemia 09/16/2020   Dyspepsia    Family history of colon cancer 02/14/2017   Abdominal pain 02/14/2017   Abdominal pain, epigastric 07/11/2012   Abdominal pain, left upper quadrant 07/11/2012   Family history of malignant neoplasm of gastrointestinal tract 07/11/2012   Thank you,  Genene Churn, CCC-SLP [phone]  Daichi Moris 12/16/2020, 1:35 PM  [place], [place], [address] Phone: [phone]   Fax:  [phone]   Name: Erika Ruiz MRN: [id number] Date of Birth: 05-17-1965

## 2020-12-30 ENCOUNTER — Encounter (HOSPITAL_COMMUNITY): Payer: Self-pay | Admitting: Speech Pathology

## 2020-12-30 ENCOUNTER — Ambulatory Visit (HOSPITAL_COMMUNITY): Payer: Commercial Managed Care - PPO | Admitting: Speech Pathology

## 2020-12-30 ENCOUNTER — Other Ambulatory Visit: Payer: Self-pay

## 2020-12-30 DIAGNOSIS — R4701 Aphasia: Secondary | ICD-10-CM | POA: Diagnosis not present

## 2020-12-30 DIAGNOSIS — R41841 Cognitive communication deficit: Secondary | ICD-10-CM

## 2020-12-30 NOTE — Therapy (Signed)
[place], [place], [address] Phone: [phone]   Fax:  [phone]  Speech Language Pathology Treatment  Patient Details  Name: Erika Ruiz MRN: [id number] Date of Birth: 10-Nov-1965 Referring Provider (SLP): Orson Eva, MD   Encounter Date: 12/30/2020   End of Session - 12/30/20 1421     Visit Number 15    Number of Visits 16    Date for SLP Re-Evaluation 01/13/21    Authorization Type [employer] PPO   $50 copay, 25th visit review   SLP Start Time 1030    SLP Stop Time  1115    SLP Time Calculation (min) 45 min    Activity Tolerance Patient tolerated treatment well             Past Medical History:  Diagnosis Date   Hyperlipemia    Hypertension    Uterine cancer ([place]) 1987   Vitamin D deficiency disease     Past Surgical History:  Procedure Laterality Date   ABDOMINAL HYSTERECTOMY     due to CA   BIOPSY  03/13/2017   Procedure: BIOPSY;  Surgeon: Danie Binder, MD;  [facility];  Service: Endoscopy;;  gastric   BREAST LUMPECTOMY Left    COLONOSCOPY WITH PROPOFOL N/A 03/13/2017   Procedure: COLONOSCOPY WITH PROPOFOL;  Surgeon: Danie Binder, MD;  [facility];  Service: Endoscopy;  Laterality: N/A;  11:00am   ESOPHAGOGASTRODUODENOSCOPY (EGD) WITH PROPOFOL N/A 03/13/2017   Procedure: ESOPHAGOGASTRODUODENOSCOPY (EGD) WITH PROPOFOL;  Surgeon: Danie Binder, MD;  [facility];  Service: Endoscopy;  Laterality: N/A;   LEFT OOPHORECTOMY     POLYPECTOMY  03/13/2017   Procedure: POLYPECTOMY;  Surgeon: Danie Binder, MD;  [facility];  Service: Endoscopy;;  colon   VAGINAL DELIVERY     X2    There were no vitals filed for this visit.   Subjective Assessment - 12/30/20 1050     Subjective "I saw the neurologist."    Patient is accompained by: Family member    Special Tests [place] Aphasia screening test (MAST)    Currently in Pain? No/denies                    ADULT SLP TREATMENT - 12/30/20 1101       General Information   Behavior/Cognition Alert;Cooperative;Pleasant mood    Patient Positioning Upright in chair    Oral care provided N/A    HPI 55 year old female with a history of hypertension, hyperlipidemia, chronic back pain, and GERD presenting with word finding difficulty.  The patient was last known normal on 09/13/2020.  Her symptoms began with a left-sided frontal headache on 09/13/20. This persisted into 09/14/2020.  Her spouse noted the patient to be "confused" on 09/14/2020 with difficulty finishing her sentences.  The patient had some word finding difficulty which persisted into 09/15/2020 and she was admitted to [facility]. MRI brain--acute/subacute L-MCA infarct; remote bilateral infarct corona radiata,left caudate and pons. She was discharged home on 09/17/20 with referral for outpatient SLP therapy due to aphasia s/p CVA.      Treatment Provided   Treatment provided Cognitive-Linquistic      Pain Assessment   Pain Assessment No/denies pain      Cognitive-Linquistic Treatment   Treatment focused on Aphasia;Patient/family/caregiver education    Skilled Treatment SLP provided skilled treatment targeting word finding strategies, confrontation naming, reading comprehension, expanding upon utterances, and providing appropriate cues  via written prompts, single word sentence completion, and phonemic cues.      Assessment / Recommendations / Plan   Plan Continue with current plan of care      Progression Toward Goals   Progression toward goals Progressing toward goals              SLP Education - 12/30/20 1420     Education Details Provided extensive HEP for word finding and reading comprehension    Person(s) Educated Patient;Spouse    Methods Explanation;Handout    Comprehension Verbalized understanding              SLP Short Term Goals - 12/30/20 1437       SLP SHORT TERM GOAL #1   Title Pt will increase  naming of high and low frequency objects/pictures to 80% acc when provided with min multimodality cues.    Baseline 90% acc for high frequency words    Time 8    Period Weeks    Status Achieved    Target Date 12/23/20      SLP SHORT TERM GOAL #2   Title Pt will describe objects and pictures by providing at least three salient features as judged by clinician with 90% acc when provided min cues.    Baseline 75% acc    Time 4    Period Weeks    Status Achieved    Target Date 11/01/20      SLP SHORT TERM GOAL #3   Title Pt will increase divergent naming to 8+ items per concrete category with mod cues.    Baseline 5    Time 8    Period Weeks    Status Achieved    Target Date 12/23/20      SLP SHORT TERM GOAL #4   Title Pt will respond to open-ended questions using a complete sentence with 90% acc and min cues.    Baseline 80% with min cues as of 10/28/20    Time 8    Period Weeks    Status Achieved    Target Date 12/23/20      SLP SHORT TERM GOAL #5   Title Pt will verbalize 7+ word sentence using subject, verb, object format when describing pictures with 90% acc with min cues    Baseline 50%    Time 4    Period Weeks    Status Achieved    Target Date 11/01/20      SLP SHORT TERM GOAL #6   Title Pt will complete basic sentence level reading comprehension tasks with 80% acc with provided mi/mod cues.    Baseline 40%    Time 4    Period Weeks    Status Achieved    Target Date 11/01/20      SLP SHORT TERM GOAL #7   Title Pt will answer moderate level auditory comprehension yes/no questions with 80% acc when provided mi/mod cues.    Baseline 60%    Time 4    Period Weeks    Status Achieved    Target Date 11/01/20      SLP SHORT TERM GOAL #8   Title Pt will increase reading comprehension for paragraph length material via multiple choice responses to 90% acc with min assist.    Baseline 75% for paragraphs and mi/mod assist    Time 8    Period Weeks    Status On-going     Target Date 01/27/21      SLP SHORT TERM GOAL  #  9   TITLE Pt will answer basic questions related to "who, what, when, where, why" with 90% acc after listenging to short story (~4-6 sentences) and provided repetition as needed.    Baseline 70% acc    Time 8    Period Weeks    Status On-going    Target Date 01/27/21      SLP SHORT TERM GOAL  #10   TITLE Pt will follow moderately complex auditory directions of up to 3 steps with repetition as needed with 90% acc and Pt to use compensatory strategies as needed (self written cues/repetition).    Baseline 80% mi/mod assist    Time 3    Period Weeks    Status On-going    Target Date 01/27/21              SLP Long Term Goals - 12/30/20 1438       SLP LONG TERM GOAL #1   Title Pt will communicate moderate level wants/needs/thoughts to family and friends with use of multimodality communication strategies as needed.    Status On-going              Plan - 12/30/20 1427     Clinical Impression Statement Pt was accompanied to therapy by her husband. She stated that she saw the neurologist the other day and expressed frustration about her continued deficits with "understanding". In session, she completed an auditory comprehension task involving SLP reading 2-3 step directions and Pt executing task with pencil and paper. She completed with 100% acc when SLP read each direction 2-3 times and when provided moderate cues. She completed the same task later in the session but via reading comprehension and only required min cues. SLP reiterated the importance of repetition and written cues for moderately complex auditory tasks. This is especially important for her medical team to understand, as she will need written summaries of visits and new information shared. She was given HEP to continue with over the next month. Pt and spouse would like additional therapy sessions and were added onto the schedule through September 14.    Speech Therapy  Frequency 1x /week    Duration 4 weeks   8 weeks   Treatment/Interventions SLP instruction and feedback;Compensatory strategies;Compensatory techniques;Patient/family education;Language facilitation;Multimodal communcation approach;Cueing hierarchy    Potential to Achieve Goals Good    Potential Considerations Financial resources    SLP Home Exercise Plan Pt will completed HEP as assigned to facilitate carryover of treatment strategies and techniques in home environment with use of written cues as needed.    Consulted and Agree with Plan of Care Patient             Patient will benefit from skilled therapeutic intervention in order to improve the following deficits and impairments:   Aphasia  Cognitive communication deficit    Problem List Patient Active Problem List   Diagnosis Date Noted   Acute ischemic stroke ([place]) 09/16/2020   Essential hypertension 09/16/2020   Chronic back pain 09/16/2020   GERD (gastroesophageal reflux disease) 09/16/2020   Hyperlipidemia 09/16/2020   Dyspepsia    Family history of colon cancer 02/14/2017   Abdominal pain 02/14/2017   Abdominal pain, epigastric 07/11/2012   Abdominal pain, left upper quadrant 07/11/2012   Family history of malignant neoplasm of gastrointestinal tract 07/11/2012   Thank you,  Genene Churn, [place]  Jozalynn Noyce 12/30/2020, 2:39 PM  [place], [place], [address] Phone: [phone]  Fax:  [phone]   Name: ELLIZA DENNLER MRN: [id number] Date of Birth: 01-26-1966

## 2021-01-06 ENCOUNTER — Ambulatory Visit (HOSPITAL_COMMUNITY): Payer: Commercial Managed Care - PPO | Admitting: Speech Pathology

## 2021-01-06 ENCOUNTER — Encounter (HOSPITAL_COMMUNITY): Payer: Self-pay | Admitting: Speech Pathology

## 2021-01-06 ENCOUNTER — Other Ambulatory Visit: Payer: Self-pay

## 2021-01-06 DIAGNOSIS — R4701 Aphasia: Secondary | ICD-10-CM | POA: Diagnosis not present

## 2021-01-06 DIAGNOSIS — R41841 Cognitive communication deficit: Secondary | ICD-10-CM

## 2021-01-06 NOTE — Therapy (Signed)
[place], [place], [address] Phone: [phone]   Fax:  [phone]  Speech Language Pathology Treatment  Patient Details  Name: Erika Ruiz MRN: [id number] Date of Birth: 08/14/1965 Referring Provider (SLP): Orson Eva, MD   Encounter Date: 01/06/2021   End of Session - 01/06/21 1049     Visit Number 16    Number of Visits 17    Date for SLP Re-Evaluation 01/13/21    Authorization Type [employer] PPO   $50 copay, 25th visit review   Authorization Time Period through 01/13/2021    SLP Start Time 1030    SLP Stop Time  1115    SLP Time Calculation (min) 45 min    Activity Tolerance Patient tolerated treatment well             Past Medical History:  Diagnosis Date   Hyperlipemia    Hypertension    Uterine cancer ([place]) 1987   Vitamin D deficiency disease     Past Surgical History:  Procedure Laterality Date   ABDOMINAL HYSTERECTOMY     due to CA   BIOPSY  03/13/2017   Procedure: BIOPSY;  Surgeon: Danie Binder, MD;  [facility];  Service: Endoscopy;;  gastric   BREAST LUMPECTOMY Left    COLONOSCOPY WITH PROPOFOL N/A 03/13/2017   Procedure: COLONOSCOPY WITH PROPOFOL;  Surgeon: Danie Binder, MD;  [facility];  Service: Endoscopy;  Laterality: N/A;  11:00am   ESOPHAGOGASTRODUODENOSCOPY (EGD) WITH PROPOFOL N/A 03/13/2017   Procedure: ESOPHAGOGASTRODUODENOSCOPY (EGD) WITH PROPOFOL;  Surgeon: Danie Binder, MD;  [facility];  Service: Endoscopy;  Laterality: N/A;   LEFT OOPHORECTOMY     POLYPECTOMY  03/13/2017   Procedure: POLYPECTOMY;  Surgeon: Danie Binder, MD;  [facility];  Service: Endoscopy;;  colon   VAGINAL DELIVERY     X2    There were no vitals filed for this visit.   Subjective Assessment - 01/06/21 1031     Subjective "I just feel tired."    Currently in Pain? No/denies                   ADULT SLP TREATMENT -  01/06/21 1032       General Information   Behavior/Cognition Alert;Cooperative;Pleasant mood    Patient Positioning Upright in chair    Oral care provided N/A    HPI 55 year old female with a history of hypertension, hyperlipidemia, chronic back pain, and GERD presenting with word finding difficulty.  The patient was last known normal on 09/13/2020.  Her symptoms began with a left-sided frontal headache on 09/13/20. This persisted into 09/14/2020.  Her spouse noted the patient to be "confused" on 09/14/2020 with difficulty finishing her sentences.  The patient had some word finding difficulty which persisted into 09/15/2020 and she was admitted to [facility]. MRI brain--acute/subacute L-MCA infarct; remote bilateral infarct corona radiata,left caudate and pons. She was discharged home on 09/17/20 with referral for outpatient SLP therapy due to aphasia s/p CVA.      Treatment Provided   Treatment provided Cognitive-Linquistic      Pain Assessment   Pain Assessment No/denies pain      Cognitive-Linquistic Treatment   Treatment focused on Aphasia;Patient/family/caregiver education    Skilled Treatment SLP provided skilled treatment targeting word finding strategies, confrontation naming, reading comprehension, expanding upon utterances, and providing appropriate cues via written prompts, single word sentence completion, and phonemic cues.  Assessment / Recommendations / Plan   Plan Continue with current plan of care      Progression Toward Goals   Progression toward goals Progressing toward goals                SLP Short Term Goals - 01/06/21 1055       SLP SHORT TERM GOAL #1   Title Pt will increase naming of high and low frequency objects/pictures to 80% acc when provided with min multimodality cues.    Baseline 90% acc for high frequency words    Time 8    Period Weeks    Status Achieved    Target Date 12/23/20      SLP SHORT TERM GOAL #2   Title Pt will describe objects and pictures by  providing at least three salient features as judged by clinician with 90% acc when provided min cues.    Baseline 75% acc    Time 4    Period Weeks    Status Achieved    Target Date 11/01/20      SLP SHORT TERM GOAL #3   Title Pt will increase divergent naming to 8+ items per concrete category with mod cues.    Baseline 5    Time 8    Period Weeks    Status Achieved    Target Date 12/23/20      SLP SHORT TERM GOAL #4   Title Pt will respond to open-ended questions using a complete sentence with 90% acc and min cues.    Baseline 80% with min cues as of 10/28/20    Time 8    Period Weeks    Status Achieved    Target Date 12/23/20      SLP SHORT TERM GOAL #5   Title Pt will verbalize 7+ word sentence using subject, verb, object format when describing pictures with 90% acc with min cues    Baseline 50%    Time 4    Period Weeks    Status Achieved    Target Date 11/01/20      SLP SHORT TERM GOAL #6   Title Pt will complete basic sentence level reading comprehension tasks with 80% acc with provided mi/mod cues.    Baseline 40%    Time 4    Period Weeks    Status Achieved    Target Date 11/01/20      SLP SHORT TERM GOAL #7   Title Pt will answer moderate level auditory comprehension yes/no questions with 80% acc when provided mi/mod cues.    Baseline 60%    Time 4    Period Weeks    Status Achieved    Target Date 11/01/20      SLP SHORT TERM GOAL #8   Title Pt will increase reading comprehension for paragraph length material via multiple choice responses to 90% acc with min assist.    Baseline 75% for paragraphs and mi/mod assist    Time 8    Period Weeks    Status On-going    Target Date 01/27/21      SLP SHORT TERM GOAL  #9   TITLE Pt will answer basic questions related to "who, what, when, where, why" with 90% acc after listenging to short story (~4-6 sentences) and provided repetition as needed.    Baseline 70% acc    Time 8    Period Weeks    Status On-going     Target Date 01/27/21  SLP SHORT TERM GOAL  #10   TITLE Pt will follow moderately complex auditory directions of up to 3 steps with repetition as needed with 90% acc and Pt to use compensatory strategies as needed (self written cues/repetition).    Baseline 80% mi/mod assist    Time 3    Period Weeks    Status On-going    Target Date 01/27/21              SLP Long Term Goals - 01/06/21 1122       SLP LONG TERM GOAL #1   Title Pt will communicate moderate level wants/needs/thoughts to family and friends with use of multimodality communication strategies as needed.    Status On-going              Plan - 01/06/21 1055     Clinical Impression Statement Pt was accompanied to therapy by her husband. HEP was reviewed. Pt required assistance for some tasks (identifying synonyms in sentences) and completed naming to description tasks independently. Initially, we were focused on expressive communication due to severity of deficits and greater need to increase communication independence. Her ability to express her wants and needs has improved tremendously, however she continues to struggle with auditory comprehension. She has become more withdrawn from conversations with people she does not know well due to feeling insecure about her language skills. SLP encouraged her to communicate with others as much as she can and to use written cues to assist her. SLP reiterated the importance of repetition and written cues for moderately complex auditory tasks. This is especially important for her medical team to understand, as she will need written summaries of visits and new information shared. She was given HEP to continue with over the next month.    Speech Therapy Frequency 1x /week    Duration 4 weeks   8 weeks   Treatment/Interventions SLP instruction and feedback;Compensatory strategies;Compensatory techniques;Patient/family education;Language facilitation;Multimodal communcation  approach;Cueing hierarchy    Potential to Achieve Goals Good    Potential Considerations Financial resources    SLP Home Exercise Plan Pt will completed HEP as assigned to facilitate carryover of treatment strategies and techniques in home environment with use of written cues as needed.    Consulted and Agree with Plan of Care Patient             Patient will benefit from skilled therapeutic intervention in order to improve the following deficits and impairments:   Aphasia  Cognitive communication deficit    Problem List Patient Active Problem List   Diagnosis Date Noted   Acute ischemic stroke ([place]) 09/16/2020   Essential hypertension 09/16/2020   Chronic back pain 09/16/2020   GERD (gastroesophageal reflux disease) 09/16/2020   Hyperlipidemia 09/16/2020   Dyspepsia    Family history of colon cancer 02/14/2017   Abdominal pain 02/14/2017   Abdominal pain, epigastric 07/11/2012   Abdominal pain, left upper quadrant 07/11/2012   Family history of malignant neoplasm of gastrointestinal tract 07/11/2012   Thank you,  Genene Churn, [place]  [facility] 01/06/2021, 12:39 PM  [place], [place], [address] Phone: [phone]   Fax:  [phone]   Name: Erika Ruiz MRN: [id number] Date of Birth: [birth year]

## 2021-01-12 ENCOUNTER — Ambulatory Visit (HOSPITAL_COMMUNITY): Payer: Commercial Managed Care - PPO | Admitting: Speech Pathology

## 2021-01-13 ENCOUNTER — Encounter (HOSPITAL_COMMUNITY): Payer: Self-pay | Admitting: Speech Pathology

## 2021-01-13 ENCOUNTER — Other Ambulatory Visit: Payer: Self-pay

## 2021-01-13 ENCOUNTER — Ambulatory Visit (HOSPITAL_COMMUNITY): Payer: Commercial Managed Care - PPO | Attending: Internal Medicine | Admitting: Speech Pathology

## 2021-01-13 DIAGNOSIS — R41841 Cognitive communication deficit: Secondary | ICD-10-CM

## 2021-01-13 DIAGNOSIS — R4701 Aphasia: Secondary | ICD-10-CM

## 2021-01-13 NOTE — Therapy (Signed)
[place], [place], [address] Phone: [phone]   Fax:  [phone]  Speech Language Pathology Treatment  Patient Details  Name: Erika Ruiz MRN: [id number] Date of Birth: 1965/10/28 Referring Provider (SLP): Orson Eva, MD   Encounter Date: 01/13/2021   End of Session - 01/13/21 1249     Visit Number 17    Number of Visits 22    Date for SLP Re-Evaluation 02/24/21    Authorization Type [employer] PPO   $50 copay, 25th visit review   Authorization Time Period through 02/24/21    SLP Start Time 1215    SLP Stop Time  1300    SLP Time Calculation (min) 45 min    Activity Tolerance Patient tolerated treatment well             Past Medical History:  Diagnosis Date   Hyperlipemia    Hypertension    Uterine cancer ([place]) 1987   Vitamin D deficiency disease     Past Surgical History:  Procedure Laterality Date   ABDOMINAL HYSTERECTOMY     due to CA   BIOPSY  03/13/2017   Procedure: BIOPSY;  Surgeon: Danie Binder, MD;  [facility];  Service: Endoscopy;;  gastric   BREAST LUMPECTOMY Left    COLONOSCOPY WITH PROPOFOL N/A 03/13/2017   Procedure: COLONOSCOPY WITH PROPOFOL;  Surgeon: Danie Binder, MD;  [facility];  Service: Endoscopy;  Laterality: N/A;  11:00am   ESOPHAGOGASTRODUODENOSCOPY (EGD) WITH PROPOFOL N/A 03/13/2017   Procedure: ESOPHAGOGASTRODUODENOSCOPY (EGD) WITH PROPOFOL;  Surgeon: Danie Binder, MD;  [facility];  Service: Endoscopy;  Laterality: N/A;   LEFT OOPHORECTOMY     POLYPECTOMY  03/13/2017   Procedure: POLYPECTOMY;  Surgeon: Danie Binder, MD;  [facility];  Service: Endoscopy;;  colon   VAGINAL DELIVERY     X2    There were no vitals filed for this visit.   Subjective Assessment - 01/13/21 1224     Subjective "I saw my doctor."    Patient is accompained by: Family member    Currently in Pain? No/denies               ADULT SLP TREATMENT - 01/13/21 1234       General Information   Behavior/Cognition Alert;Cooperative;Pleasant mood    Patient Positioning Upright in chair    Oral care provided N/A    HPI 55 year old female with a history of hypertension, hyperlipidemia, chronic back pain, and GERD presenting with word finding difficulty.  The patient was last known normal on 09/13/2020.  Her symptoms began with a left-sided frontal headache on 09/13/20. This persisted into 09/14/2020.  Her spouse noted the patient to be "confused" on 09/14/2020 with difficulty finishing her sentences.  The patient had some word finding difficulty which persisted into 09/15/2020 and she was admitted to [facility]. MRI brain--acute/subacute L-MCA infarct; remote bilateral infarct corona radiata,left caudate and pons. She was discharged home on 09/17/20 with referral for outpatient SLP therapy due to aphasia s/p CVA.      Treatment Provided   Treatment provided Cognitive-Linquistic      Pain Assessment   Pain Assessment No/denies pain      Cognitive-Linquistic Treatment   Treatment focused on Aphasia;Patient/family/caregiver education    Skilled Treatment SLP provided skilled treatment targeting word finding strategies, confrontation naming, reading comprehension, expanding upon utterances, and providing appropriate cues via written prompts, single word sentence completion,  and phonemic cues.      Assessment / Recommendations / Plan   Plan Continue with current plan of care      Progression Toward Goals   Progression toward goals Progressing toward goals              SLP Education - 01/13/21 1244     Education Details provided additional HEP for word finding and reading comprehension    Person(s) Educated Patient;Spouse    Methods Explanation    Comprehension Verbalized understanding              SLP Short Term Goals - 01/13/21 1319       SLP SHORT TERM GOAL #1   Title Pt will increase naming of high and low  frequency objects/pictures to 80% acc when provided with min multimodality cues.    Baseline 90% acc for high frequency words    Time 8    Period Weeks    Status Achieved    Target Date 12/23/20      SLP SHORT TERM GOAL #2   Title Pt will describe objects and pictures by providing at least three salient features as judged by clinician with 90% acc when provided min cues.    Baseline 75% acc    Time 4    Period Weeks    Status Achieved    Target Date 11/01/20      SLP SHORT TERM GOAL #3   Title Pt will increase divergent naming to 8+ items per concrete category with mod cues.    Baseline 5    Time 8    Period Weeks    Status Achieved    Target Date 12/23/20      SLP SHORT TERM GOAL #4   Title Pt will respond to open-ended questions using a complete sentence with 90% acc and min cues.    Baseline 80% with min cues as of 10/28/20    Time 8    Period Weeks    Status Achieved    Target Date 12/23/20      SLP SHORT TERM GOAL #5   Title Pt will verbalize 7+ word sentence using subject, verb, object format when describing pictures with 90% acc with min cues    Baseline 50%    Time 4    Period Weeks    Status Achieved    Target Date 11/01/20      SLP SHORT TERM GOAL #6   Title Pt will complete basic sentence level reading comprehension tasks with 80% acc with provided mi/mod cues.    Baseline 40%    Time 4    Period Weeks    Status Achieved    Target Date 11/01/20      SLP SHORT TERM GOAL #7   Title Pt will comlete moderate level synonym and antonym tasks with 90% acc when provided with mi/mod cues.    Baseline 70% mod assist    Time 4    Period Weeks    Status New    Target Date 02/24/21      SLP SHORT TERM GOAL #8   Title Pt will increase reading comprehension for paragraph length material via multiple choice responses to 90% acc with min assist.    Baseline 75% for paragraphs and mi/mod assist    Time 8    Period Weeks    Status On-going    Target Date 02/24/21       SLP SHORT TERM GOAL  #9  TITLE Pt will answer basic questions related to "who, what, when, where, why" with 90% acc after listenging to short story (~4-6 sentences) and provided repetition as needed.    Baseline 70% acc    Time 8    Period Weeks    Status On-going    Target Date 02/24/21      SLP SHORT TERM GOAL  #10   TITLE Pt will follow moderately complex auditory directions of up to 3 steps with repetition as needed with 90% acc and Pt to use compensatory strategies as needed (self written cues/repetition).    Baseline 80% mi/mod assist    Time 3    Period Weeks    Status On-going    Target Date 02/24/21              SLP Long Term Goals - 01/06/21 1122       SLP LONG TERM GOAL #1   Title Pt will communicate moderate level wants/needs/thoughts to family and friends with use of multimodality communication strategies as needed.    Status On-going              Plan - 01/13/21 1301     Clinical Impression Statement Pt was accompanied to therapy by her husband. She continues to be frustrated about her comprehension deficits. She benefits from written cues and repetition. She frequently looks to her husband to assist her in responses. She completed HEP with some missing responses due to difficulty of task. In session, she completed auditory comprehension exercise using pencil and paper with 75% acc with frequent repetition of tasks.    Speech Therapy Frequency 1x /week    Duration --   5 weeks   Treatment/Interventions SLP instruction and feedback;Compensatory strategies;Compensatory techniques;Patient/family education;Language facilitation;Multimodal communcation approach;Cueing hierarchy    Potential to Achieve Goals Good    Potential Considerations Financial resources    SLP Home Exercise Plan Pt will completed HEP as assigned to facilitate carryover of treatment strategies and techniques in home environment with use of written cues as needed.    Consulted and Agree  with Plan of Care Patient             Patient will benefit from skilled therapeutic intervention in order to improve the following deficits and impairments:   Aphasia  Cognitive communication deficit    Problem List Patient Active Problem List   Diagnosis Date Noted   Acute ischemic stroke ([place]) 09/16/2020   Essential hypertension 09/16/2020   Chronic back pain 09/16/2020   GERD (gastroesophageal reflux disease) 09/16/2020   Hyperlipidemia 09/16/2020   Dyspepsia    Family history of colon cancer 02/14/2017   Abdominal pain 02/14/2017   Abdominal pain, epigastric 07/11/2012   Abdominal pain, left upper quadrant 07/11/2012   Family history of malignant neoplasm of gastrointestinal tract 07/11/2012   Thank you,  Genene Churn, [place]  Landry Kamath 01/13/2021, 1:47 PM  [place], [place], [address] Phone: [phone]   Fax:  [phone]   Name: BRE GETCHELL MRN: [id number] Date of Birth: [birth year]

## 2021-01-26 ENCOUNTER — Ambulatory Visit (HOSPITAL_COMMUNITY): Payer: Commercial Managed Care - PPO | Admitting: Speech Pathology

## 2021-01-27 ENCOUNTER — Ambulatory Visit (HOSPITAL_COMMUNITY): Payer: Commercial Managed Care - PPO | Admitting: Speech Pathology

## 2021-01-27 ENCOUNTER — Other Ambulatory Visit: Payer: Self-pay

## 2021-01-27 ENCOUNTER — Encounter (HOSPITAL_COMMUNITY): Payer: Self-pay | Admitting: Speech Pathology

## 2021-01-27 DIAGNOSIS — R4701 Aphasia: Secondary | ICD-10-CM

## 2021-01-27 DIAGNOSIS — R41841 Cognitive communication deficit: Secondary | ICD-10-CM

## 2021-01-27 NOTE — Therapy (Signed)
[place] [address] [place], [place], [address] Phone: [phone]   Fax:  [phone]  Speech Language Pathology Treatment  Patient Details  Name: Erika Ruiz MRN: [id number] Date of Birth: [birth year] Referring Provider (SLP): Orson Eva, MD   Encounter Date: 01/27/2021   End of Session - 01/27/21 1228     Visit Number 18    Number of Visits 22    Date for SLP Re-Evaluation 02/24/21    Authorization Type [employer] PPO   $50 copay, 25th visit review   Authorization Time Period through 02/24/21    SLP Start Time 1215    SLP Stop Time  1300    SLP Time Calculation (min) 45 min    Activity Tolerance Patient tolerated treatment well             Past Medical History:  Diagnosis Date   Hyperlipemia    Hypertension    Uterine cancer ([place]) 1987   Vitamin D deficiency disease     Past Surgical History:  Procedure Laterality Date   ABDOMINAL HYSTERECTOMY     due to CA   BIOPSY  03/13/2017   Procedure: BIOPSY;  Surgeon: Danie Binder, MD;  [facility];  Service: Endoscopy;;  gastric   BREAST LUMPECTOMY Left    COLONOSCOPY WITH PROPOFOL N/A 03/13/2017   Procedure: COLONOSCOPY WITH PROPOFOL;  Surgeon: Danie Binder, MD;  [facility];  Service: Endoscopy;  Laterality: N/A;  11:00am   ESOPHAGOGASTRODUODENOSCOPY (EGD) WITH PROPOFOL N/A 03/13/2017   Procedure: ESOPHAGOGASTRODUODENOSCOPY (EGD) WITH PROPOFOL;  Surgeon: Danie Binder, MD;  [facility];  Service: Endoscopy;  Laterality: N/A;   LEFT OOPHORECTOMY     POLYPECTOMY  03/13/2017   Procedure: POLYPECTOMY;  Surgeon: Danie Binder, MD;  [facility];  Service: Endoscopy;;  colon   VAGINAL DELIVERY     X2    There were no vitals filed for this visit.   Subjective Assessment - 01/27/21 1222     Subjective "Is there anything?"    Currently in Pain? No/denies                   ADULT SLP TREATMENT -  01/27/21 0001       General Information   Behavior/Cognition Alert;Cooperative;Pleasant mood    Patient Positioning Upright in chair    Oral care provided N/A    HPI 55 year old female with a history of hypertension, hyperlipidemia, chronic back pain, and GERD presenting with word finding difficulty.  The patient was last known normal on 09/13/2020.  Her symptoms began with a left-sided frontal headache on 09/13/20. This persisted into 09/14/2020.  Her spouse noted the patient to be "confused" on 09/14/2020 with difficulty finishing her sentences.  The patient had some word finding difficulty which persisted into 09/15/2020 and she was admitted to [facility]. MRI brain--acute/subacute L-MCA infarct; remote bilateral infarct corona radiata,left caudate and pons. She was discharged home on 09/17/20 with referral for outpatient SLP therapy due to aphasia s/p CVA.      Treatment Provided   Treatment provided Cognitive-Linquistic      Pain Assessment   Pain Assessment No/denies pain      Cognitive-Linquistic Treatment   Treatment focused on Aphasia;Patient/family/caregiver education    Skilled Treatment SLP provided skilled treatment targeting word finding strategies, confrontation naming, reading comprehension, expanding upon utterances, and providing appropriate cues via written prompts, single word sentence completion, and phonemic cues.  Assessment / Recommendations / Plan   Plan Continue with current plan of care      Progression Toward Goals   Progression toward goals Progressing toward goals                SLP Short Term Goals - 01/27/21 1229       SLP SHORT TERM GOAL #1   Title Pt will increase naming of high and low frequency objects/pictures to 80% acc when provided with min multimodality cues.    Baseline 90% acc for high frequency words    Time 8    Period Weeks    Status Achieved    Target Date 12/23/20      SLP SHORT TERM GOAL #2   Title Pt will describe objects and pictures by  providing at least three salient features as judged by clinician with 90% acc when provided min cues.    Baseline 75% acc    Time 4    Period Weeks    Status Achieved    Target Date 11/01/20      SLP SHORT TERM GOAL #3   Title Pt will increase divergent naming to 8+ items per concrete category with mod cues.    Baseline 5    Time 8    Period Weeks    Status Achieved    Target Date 12/23/20      SLP SHORT TERM GOAL #4   Title Pt will respond to open-ended questions using a complete sentence with 90% acc and min cues.    Baseline 80% with min cues as of 10/28/20    Time 8    Period Weeks    Status Achieved    Target Date 12/23/20      SLP SHORT TERM GOAL #5   Title Pt will verbalize 7+ word sentence using subject, verb, object format when describing pictures with 90% acc with min cues    Baseline 50%    Time 4    Period Weeks    Status Achieved    Target Date 11/01/20      SLP SHORT TERM GOAL #6   Title Pt will complete basic sentence level reading comprehension tasks with 80% acc with provided mi/mod cues.    Baseline 40%    Time 4    Period Weeks    Status Achieved    Target Date 11/01/20      SLP SHORT TERM GOAL #7   Title Pt will comlete moderate level synonym and antonym tasks with 90% acc when provided with mi/mod cues.    Baseline 70% mod assist    Time 4    Period Weeks    Status On-going    Target Date 02/24/21      SLP SHORT TERM GOAL #8   Title Pt will increase reading comprehension for paragraph length material via multiple choice responses to 90% acc with min assist.    Baseline 75% for paragraphs and mi/mod assist    Time 8    Period Weeks    Status On-going    Target Date 02/24/21      SLP SHORT TERM GOAL  #9   TITLE Pt will answer basic questions related to "who, what, when, where, why" with 90% acc after listenging to short story (~4-6 sentences) and provided repetition as needed.    Baseline 70% acc    Time 8    Period Weeks    Status  On-going    Target Date 02/24/21  SLP SHORT TERM GOAL  #10   TITLE Pt will follow moderately complex auditory directions of up to 3 steps with repetition as needed with 90% acc and Pt to use compensatory strategies as needed (self written cues/repetition).    Baseline 80% mi/mod assist    Time 3    Period Weeks    Status On-going    Target Date 02/24/21              SLP Long Term Goals - 01/27/21 1237       SLP LONG TERM GOAL #1   Title Pt will communicate moderate level wants/needs/thoughts to family and friends with use of multimodality communication strategies as needed.    Status On-going              Plan - 01/27/21 1229     Clinical Impression Statement Pt was accompanied to therapy by her husband. Treatment consisted of answering auditory comprehension yes/no questions (100% acc with min cues), responsive naming (95% with min cues), auditory comprehension for short paragraphs (90% min cues), and implementation of self generated cues for word finding. She was able to self cue by writing the first letter at times. Pt with improved confidence noted today. Continue plan of care and completion of home program.    Speech Therapy Frequency 1x /week    Duration --   5 weeks   Treatment/Interventions SLP instruction and feedback;Compensatory strategies;Compensatory techniques;Patient/family education;Language facilitation;Multimodal communcation approach;Cueing hierarchy    Potential to Achieve Goals Good    Potential Considerations Financial resources    SLP Home Exercise Plan Pt will completed HEP as assigned to facilitate carryover of treatment strategies and techniques in home environment with use of written cues as needed.    Consulted and Agree with Plan of Care Patient             Patient will benefit from skilled therapeutic intervention in order to improve the following deficits and impairments:   Aphasia  Cognitive communication deficit    Problem  List Patient Active Problem List   Diagnosis Date Noted   Acute ischemic stroke ([place]) 09/16/2020   Essential hypertension 09/16/2020   Chronic back pain 09/16/2020   GERD (gastroesophageal reflux disease) 09/16/2020   Hyperlipidemia 09/16/2020   Dyspepsia    Family history of colon cancer 02/14/2017   Abdominal pain 02/14/2017   Abdominal pain, epigastric 07/11/2012   Abdominal pain, left upper quadrant 07/11/2012   Family history of malignant neoplasm of gastrointestinal tract 07/11/2012   Thank you,  Genene Churn, [place]  Danay Mckellar 01/27/2021, 12:40 PM  [place], [place], [address] Phone: [phone]   Fax:  [phone]   Name: Erika Ruiz MRN: [id number] Date of Birth: [birth year]

## 2021-01-28 ENCOUNTER — Other Ambulatory Visit: Payer: Self-pay | Admitting: Family Medicine

## 2021-01-28 DIAGNOSIS — Z1231 Encounter for screening mammogram for malignant neoplasm of breast: Secondary | ICD-10-CM

## 2021-02-02 ENCOUNTER — Ambulatory Visit: Payer: Self-pay | Admitting: Neurology

## 2021-02-03 ENCOUNTER — Encounter (HOSPITAL_COMMUNITY): Payer: Self-pay | Admitting: Speech Pathology

## 2021-02-03 ENCOUNTER — Other Ambulatory Visit: Payer: Self-pay

## 2021-02-03 ENCOUNTER — Ambulatory Visit (HOSPITAL_COMMUNITY): Payer: Commercial Managed Care - PPO | Admitting: Speech Pathology

## 2021-02-03 DIAGNOSIS — R4701 Aphasia: Secondary | ICD-10-CM | POA: Diagnosis not present

## 2021-02-03 DIAGNOSIS — R41841 Cognitive communication deficit: Secondary | ICD-10-CM

## 2021-02-03 NOTE — Therapy (Signed)
[place], [place], [address] Phone: [phone]   Fax:  [phone]  Speech Language Pathology Treatment  Patient Details  Name: Erika Ruiz MRN: [id number] Date of Birth: April 16, 1966 Referring Provider (SLP): Orson Eva, MD  Encounter Date: 02/03/2021   End of Session - 02/03/21 1048     Visit Number 19    Number of Visits 22    Date for SLP Re-Evaluation 02/24/21    Authorization Type [employer] PPO   $50 copay, 25th visit review   Authorization Time Period through 02/24/21    SLP Start Time 1030    SLP Stop Time  1115    SLP Time Calculation (min) 45 min    Activity Tolerance Patient tolerated treatment well             Past Medical History:  Diagnosis Date   Hyperlipemia    Hypertension    Uterine cancer ([place]) 1987   Vitamin D deficiency disease     Past Surgical History:  Procedure Laterality Date   ABDOMINAL HYSTERECTOMY     due to CA   BIOPSY  03/13/2017   Procedure: BIOPSY;  Surgeon: Danie Binder, MD;  [facility];  Service: Endoscopy;;  gastric   BREAST LUMPECTOMY Left    COLONOSCOPY WITH PROPOFOL N/A 03/13/2017   Procedure: COLONOSCOPY WITH PROPOFOL;  Surgeon: Danie Binder, MD;  [facility];  Service: Endoscopy;  Laterality: N/A;  11:00am   ESOPHAGOGASTRODUODENOSCOPY (EGD) WITH PROPOFOL N/A 03/13/2017   Procedure: ESOPHAGOGASTRODUODENOSCOPY (EGD) WITH PROPOFOL;  Surgeon: Danie Binder, MD;  [facility];  Service: Endoscopy;  Laterality: N/A;   LEFT OOPHORECTOMY     POLYPECTOMY  03/13/2017   Procedure: POLYPECTOMY;  Surgeon: Danie Binder, MD;  [facility];  Service: Endoscopy;;  colon   VAGINAL DELIVERY     X2    There were no vitals filed for this visit.   Subjective Assessment - 02/03/21 1041     Subjective "I am not sure what that means."    Patient is accompained by: Family member    Currently in Pain? No/denies               ADULT SLP TREATMENT - 02/03/21 1044       General Information   Behavior/Cognition Alert;Cooperative;Pleasant mood    Patient Positioning Upright in chair    Oral care provided N/A    HPI 55 year old female with a history of hypertension, hyperlipidemia, chronic back pain, and GERD presenting with word finding difficulty.  The patient was last known normal on 09/13/2020.  Her symptoms began with a left-sided frontal headache on 09/13/20. This persisted into 09/14/2020.  Her spouse noted the patient to be "confused" on 09/14/2020 with difficulty finishing her sentences.  The patient had some word finding difficulty which persisted into 09/15/2020 and she was admitted to [facility]. MRI brain--acute/subacute L-MCA infarct; remote bilateral infarct corona radiata,left caudate and pons. She was discharged home on 09/17/20 with referral for outpatient SLP therapy due to aphasia s/p CVA.      Treatment Provided   Treatment provided Cognitive-Linquistic      Pain Assessment   Pain Assessment No/denies pain      Cognitive-Linquistic Treatment   Treatment focused on Aphasia;Patient/family/caregiver education    Skilled Treatment SLP provided skilled treatment targeting word finding strategies, confrontation naming, reading comprehension, expanding upon utterances, and providing appropriate cues via written prompts, single word  sentence completion, and phonemic cues.      Assessment / Recommendations / Plan   Plan Continue with current plan of care      Progression Toward Goals   Progression toward goals Progressing toward goals                SLP Short Term Goals - 02/03/21 1052       SLP SHORT TERM GOAL #1   Title Pt will increase naming of high and low frequency objects/pictures to 80% acc when provided with min multimodality cues.    Baseline 90% acc for high frequency words    Time 8    Period Weeks    Status Achieved    Target Date 12/23/20      SLP SHORT TERM GOAL #2    Title Pt will describe objects and pictures by providing at least three salient features as judged by clinician with 90% acc when provided min cues.    Baseline 75% acc    Time 4    Period Weeks    Status Achieved    Target Date 11/01/20      SLP SHORT TERM GOAL #3   Title Pt will increase divergent naming to 8+ items per concrete category with mod cues.    Baseline 5    Time 8    Period Weeks    Status Achieved    Target Date 12/23/20      SLP SHORT TERM GOAL #4   Title Pt will respond to open-ended questions using a complete sentence with 90% acc and min cues.    Baseline 80% with min cues as of 10/28/20    Time 8    Period Weeks    Status Achieved    Target Date 12/23/20      SLP SHORT TERM GOAL #5   Title Pt will verbalize 7+ word sentence using subject, verb, object format when describing pictures with 90% acc with min cues    Baseline 50%    Time 4    Period Weeks    Status Achieved    Target Date 11/01/20      SLP SHORT TERM GOAL #6   Title Pt will complete basic sentence level reading comprehension tasks with 80% acc with provided mi/mod cues.    Baseline 40%    Time 4    Period Weeks    Status Achieved    Target Date 11/01/20      SLP SHORT TERM GOAL #7   Title Pt will comlete moderate level synonym and antonym tasks with 90% acc when provided with mi/mod cues.    Baseline 70% mod assist    Time 4    Period Weeks    Status On-going    Target Date 02/24/21      SLP SHORT TERM GOAL #8   Title Pt will increase reading comprehension for paragraph length material via multiple choice responses to 90% acc with min assist.    Baseline 75% for paragraphs and mi/mod assist    Time 8    Period Weeks    Status On-going    Target Date 02/24/21      SLP SHORT TERM GOAL  #9   TITLE Pt will answer basic questions related to "who, what, when, where, why" with 90% acc after listenging to short story (~4-6 sentences) and provided repetition as needed.    Baseline 70% acc     Time 8    Period Weeks  Status On-going    Target Date 02/24/21      SLP SHORT TERM GOAL  #10   TITLE Pt will follow moderately complex auditory directions of up to 3 steps with repetition as needed with 90% acc and Pt to use compensatory strategies as needed (self written cues/repetition).    Baseline 80% mi/mod assist    Time 3    Period Weeks    Status On-going    Target Date 02/24/21              SLP Long Term Goals - 02/03/21 1053       SLP LONG TERM GOAL #1   Title Pt will communicate moderate level wants/needs/thoughts to family and friends with use of multimodality communication strategies as needed.    Status On-going              Plan - 02/03/21 1049     Clinical Impression Statement Pt was accompanied to therapy by her husband. Treatment consisted of providing synonyms and generating sentences with verbal mediation and written strategies. She completed responsive naming questions with 95% acc. SLP orally read an article to use for discussion and auditory comprehension.  Continue plan of care and completion of home program.    Speech Therapy Frequency 1x /week    Duration --   5 weeks   Treatment/Interventions SLP instruction and feedback;Compensatory strategies;Compensatory techniques;Patient/family education;Language facilitation;Multimodal communcation approach;Cueing hierarchy    Potential to Achieve Goals Good    Potential Considerations Financial resources    SLP Home Exercise Plan Pt will completed HEP as assigned to facilitate carryover of treatment strategies and techniques in home environment with use of written cues as needed.    Consulted and Agree with Plan of Care Patient             Patient will benefit from skilled therapeutic intervention in order to improve the following deficits and impairments:   Aphasia  Cognitive communication deficit    Problem List Patient Active Problem List   Diagnosis Date Noted   Acute ischemic  stroke ([place]) 09/16/2020   Essential hypertension 09/16/2020   Chronic back pain 09/16/2020   GERD (gastroesophageal reflux disease) 09/16/2020   Hyperlipidemia 09/16/2020   Dyspepsia    Family history of colon cancer 02/14/2017   Abdominal pain 02/14/2017   Abdominal pain, epigastric 07/11/2012   Abdominal pain, left upper quadrant 07/11/2012   Family history of malignant neoplasm of gastrointestinal tract 07/11/2012   Thank you,  Genene Churn, [place]  Genene Churn 02/03/2021, 2:27 PM  [place], [place], [address] Phone: [phone]   Fax:  [phone]   Name: BRODI NERY MRN: [id number] Date of Birth: [birth year]

## 2021-02-10 ENCOUNTER — Ambulatory Visit (HOSPITAL_COMMUNITY): Payer: Commercial Managed Care - PPO | Admitting: Speech Pathology

## 2021-02-17 ENCOUNTER — Other Ambulatory Visit: Payer: Self-pay

## 2021-02-17 ENCOUNTER — Ambulatory Visit (HOSPITAL_COMMUNITY): Payer: Commercial Managed Care - PPO | Attending: Internal Medicine | Admitting: Speech Pathology

## 2021-02-17 ENCOUNTER — Encounter (HOSPITAL_COMMUNITY): Payer: Self-pay | Admitting: Speech Pathology

## 2021-02-17 DIAGNOSIS — R4701 Aphasia: Secondary | ICD-10-CM | POA: Diagnosis not present

## 2021-02-17 DIAGNOSIS — R41841 Cognitive communication deficit: Secondary | ICD-10-CM | POA: Diagnosis present

## 2021-02-17 NOTE — Therapy (Signed)
[place], [place], [address] Phone: [phone]   Fax:  [phone]  Speech Language Pathology Treatment  Patient Details  Name: Erika Ruiz MRN: [id number] Date of Birth: [birth year] Referring Provider (SLP): Orson Eva, MD   Encounter Date: 02/17/2021   End of Session - 02/17/21 1220     Visit Number 20    Number of Visits 22    Date for SLP Re-Evaluation 02/24/21    Authorization Type [employer] PPO   $50 copay, 25th visit review   Authorization Time Period through 02/24/21    SLP Start Time 1100    SLP Stop Time  1145    SLP Time Calculation (min) 45 min    Activity Tolerance Patient tolerated treatment well             Past Medical History:  Diagnosis Date   Hyperlipemia    Hypertension    Uterine cancer ([place]) 1987   Vitamin D deficiency disease     Past Surgical History:  Procedure Laterality Date   ABDOMINAL HYSTERECTOMY     due to CA   BIOPSY  03/13/2017   Procedure: BIOPSY;  Surgeon: Danie Binder, MD;  [facility];  Service: Endoscopy;;  gastric   BREAST LUMPECTOMY Left    COLONOSCOPY WITH PROPOFOL N/A 03/13/2017   Procedure: COLONOSCOPY WITH PROPOFOL;  Surgeon: Danie Binder, MD;  [facility];  Service: Endoscopy;  Laterality: N/A;  11:00am   ESOPHAGOGASTRODUODENOSCOPY (EGD) WITH PROPOFOL N/A 03/13/2017   Procedure: ESOPHAGOGASTRODUODENOSCOPY (EGD) WITH PROPOFOL;  Surgeon: Danie Binder, MD;  [facility];  Service: Endoscopy;  Laterality: N/A;   LEFT OOPHORECTOMY     POLYPECTOMY  03/13/2017   Procedure: POLYPECTOMY;  Surgeon: Danie Binder, MD;  [facility];  Service: Endoscopy;;  colon   VAGINAL DELIVERY     X2    There were no vitals filed for this visit.   Subjective Assessment - 02/17/21 1214     Subjective "I can't afford all the bills."    Currently in Pain? No/denies              ADULT SLP TREATMENT -  02/17/21 1215       General Information   Behavior/Cognition Alert;Cooperative;Pleasant mood    Patient Positioning Upright in chair    Oral care provided N/A    HPI 55 year old female with a history of hypertension, hyperlipidemia, chronic back pain, and GERD presenting with word finding difficulty.  The patient was last known normal on 09/13/2020.  Her symptoms began with a left-sided frontal headache on 09/13/20. This persisted into 09/14/2020.  Her spouse noted the patient to be "confused" on 09/14/2020 with difficulty finishing her sentences.  The patient had some word finding difficulty which persisted into 09/15/2020 and she was admitted to [facility]. MRI brain--acute/subacute L-MCA infarct; remote bilateral infarct corona radiata,left caudate and pons. She was discharged home on 09/17/20 with referral for outpatient SLP therapy due to aphasia s/p CVA.      Treatment Provided   Treatment provided Cognitive-Linquistic      Pain Assessment   Pain Assessment No/denies pain      Cognitive-Linquistic Treatment   Treatment focused on Aphasia;Patient/family/caregiver education    Skilled Treatment SLP provided skilled treatment targeting auditory and reading comprehension, expanding verbal utterances, and Pt/caregiver education regarding post discharge plans.      Assessment / Recommendations / Plan   Plan Discharge  SLP treatment due to (comment)   Goals partially achieved     Progression Toward Goals   Progression toward goals Goals met, education completed, patient discharged from [facility] - 02/17/21 1219     Education Details Plan for d/c, additional HEP provided, and Pt encouraged to pursue disability due to severity of language deficits    Person(s) Educated Patient;Spouse    Methods Explanation;Handout    Comprehension Verbalized understanding              SLP Short Term Goals - 02/17/21 1221       SLP SHORT TERM GOAL #1   Title Pt will increase naming of  high and low frequency objects/pictures to 80% acc when provided with min multimodality cues.    Baseline 90% acc for high frequency words    Time 8    Period Weeks    Status Achieved    Target Date 12/23/20      SLP SHORT TERM GOAL #2   Title Pt will describe objects and pictures by providing at least three salient features as judged by clinician with 90% acc when provided min cues.    Baseline 75% acc    Time 4    Period Weeks    Status Achieved    Target Date 11/01/20      SLP SHORT TERM GOAL #3   Title Pt will increase divergent naming to 8+ items per concrete category with mod cues.    Baseline 5    Time 8    Period Weeks    Status Achieved    Target Date 12/23/20      SLP SHORT TERM GOAL #4   Title Pt will respond to open-ended questions using a complete sentence with 90% acc and min cues.    Baseline 80% with min cues as of 10/28/20    Time 8    Period Weeks    Status Achieved    Target Date 12/23/20      SLP SHORT TERM GOAL #5   Title Pt will verbalize 7+ word sentence using subject, verb, object format when describing pictures with 90% acc with min cues    Baseline 50%    Time 4    Period Weeks    Status Achieved    Target Date 11/01/20      SLP SHORT TERM GOAL #6   Title Pt will complete basic sentence level reading comprehension tasks with 80% acc with provided mi/mod cues.    Baseline 40%    Time 4    Period Weeks    Status Achieved    Target Date 11/01/20      SLP SHORT TERM GOAL #7   Title Pt will comlete moderate level synonym and antonym tasks with 90% acc when provided with mi/mod cues.    Baseline 70% mod assist    Time 4    Period Weeks    Status Achieved    Target Date 02/24/21      SLP SHORT TERM GOAL #8   Title Pt will increase reading comprehension for paragraph length material via multiple choice responses to 90% acc with min assist.    Baseline 75% for paragraphs and mi/mod assist    Time 8    Period Weeks    Status Achieved     Target Date 02/24/21      SLP SHORT TERM GOAL  #9  TITLE Pt will answer basic questions related to "who, what, when, where, why" with 90% acc after listenging to short story (~4-6 sentences) and provided repetition as needed.    Baseline 70% acc    Time 8    Period Weeks    Status Partially Met    Target Date 02/24/21      SLP SHORT TERM GOAL  #10   TITLE Pt will follow moderately complex auditory directions of up to 3 steps with repetition as needed with 90% acc and Pt to use compensatory strategies as needed (self written cues/repetition).    Baseline 80% mi/mod assist    Time 3    Period Weeks    Status Achieved    Target Date 02/24/21              SLP Long Term Goals - 02/17/21 1223       SLP LONG TERM GOAL #1   Title Pt will communicate moderate level wants/needs/thoughts to family and friends with use of multimodality communication strategies as needed.    Status Achieved              Plan - 02/17/21 1220     Clinical Impression Statement Pt was accompanied to therapy by her husband. She expressed frustration over not being approved for disability and concern for lack of income. SLP encouraged her to work with her husband to appeal. She is also worried about affording therapy and would like to be discharged. She has made tremendous improvement, however continued to be limited by the severity of her auditory comprehension deficits. She is now able to orally read short paragraphs and answer comprehension questions with 90% acc. Auditory comprehension of a similar story, yields ~70% accuracy. While she has difficulty organizing her thoughts for complex verbal expression tasks, she is able to answer basic/mod questions with use of strategies. Pt has aphasia language programs on her ipad/phone and continues to use at home. In addition, SLP provided story length reading comprehension tasks to complete at home post discharge. 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 She would have difficulty returning to any work that requires communication (computer work, talking on the phone, following directions, and giving directions to others). Pt will be discharged from therapy at this time to HEP. Pt and spouse are in agreement with plan of care.     Duration --   5 weeks   Treatment/Interventions SLP instruction and feedback;Compensatory strategies;Compensatory techniques;Patient/family education;Language facilitation;Multimodal communcation approach;Cueing hierarchy    Potential to Achieve Goals Good    Potential Considerations Financial resources    SLP Home Exercise Plan Pt will completed HEP as assigned to facilitate carryover of treatment strategies and techniques in home environment with use of written cues as needed.    Consulted and Agree with Plan of Care Patient             Patient will benefit from skilled therapeutic intervention in order to improve the following deficits and impairments:   Aphasia  Cognitive communication deficit    Problem List Patient Active Problem List   Diagnosis Date Noted   Acute ischemic stroke ([place]) 09/16/2020   Essential hypertension 09/16/2020   Chronic back pain 09/16/2020   GERD (gastroesophageal reflux disease) 09/16/2020   Hyperlipidemia 09/16/2020   Dyspepsia    Family history of colon cancer 02/14/2017   Abdominal pain 02/14/2017   Abdominal  pain, epigastric 07/11/2012   Abdominal pain, left upper quadrant 07/11/2012   Family history of malignant neoplasm of gastrointestinal tract 07/11/2012   SPEECH THERAPY DISCHARGE SUMMARY  Visits from Start of Care: 20  Current functional level related to goals / functional outcomes: See above   Remaining deficits: See above, mild to mild/mod mixed aphasia   Education / Equipment: HEP provided    Patient agrees to discharge. Patient goals were partially met. Patient is being discharged due to financial reasons.Lujean Rave you,  Genene Churn, [place]  [facility] 02/17/2021, 12:23 PM  [place] [address] [place], [place], [address] Phone: [phone]   Fax:  [phone]   Name: Erika Ruiz MRN: [id number] Date of Birth: 23-Mar-1966

## 2021-02-24 ENCOUNTER — Ambulatory Visit (HOSPITAL_COMMUNITY): Payer: Commercial Managed Care - PPO | Admitting: Speech Pathology

## 2021-03-04 ENCOUNTER — Ambulatory Visit: Payer: Commercial Managed Care - PPO

## 2021-03-22 ENCOUNTER — Ambulatory Visit: Payer: Commercial Managed Care - PPO | Admitting: Family Medicine

## 2021-04-06 ENCOUNTER — Ambulatory Visit
Admission: RE | Admit: 2021-04-06 | Discharge: 2021-04-06 | Disposition: A | Discharge status: Home / Self Care | Payer: Commercial Managed Care - PPO | Source: Ambulatory Visit | Attending: Family Medicine | Admitting: Family Medicine

## 2021-04-06 DIAGNOSIS — Z1231 Encounter for screening mammogram for malignant neoplasm of breast: Secondary | ICD-10-CM

## 2021-04-22 ENCOUNTER — Ambulatory Visit: Payer: Commercial Managed Care - PPO | Admitting: Nurse Practitioner

## 2021-08-02 ENCOUNTER — Ambulatory Visit: Payer: Commercial Managed Care - PPO | Admitting: Nurse Practitioner

## 2022-01-24 DIAGNOSIS — I6609 Occlusion and stenosis of unspecified middle cerebral artery: Secondary | ICD-10-CM | POA: Diagnosis not present

## 2022-01-24 DIAGNOSIS — I69320 Aphasia following cerebral infarction: Secondary | ICD-10-CM | POA: Diagnosis not present

## 2022-01-24 DIAGNOSIS — R5382 Chronic fatigue, unspecified: Secondary | ICD-10-CM | POA: Diagnosis not present

## 2022-01-24 DIAGNOSIS — I10 Essential (primary) hypertension: Secondary | ICD-10-CM | POA: Diagnosis not present

## 2022-01-28 DIAGNOSIS — J309 Allergic rhinitis, unspecified: Secondary | ICD-10-CM | POA: Diagnosis not present

## 2022-01-28 DIAGNOSIS — I10 Essential (primary) hypertension: Secondary | ICD-10-CM | POA: Diagnosis not present

## 2022-01-28 DIAGNOSIS — Z20822 Contact with and (suspected) exposure to covid-19: Secondary | ICD-10-CM | POA: Diagnosis not present

## 2022-03-14 ENCOUNTER — Encounter: Payer: Self-pay | Admitting: *Deleted

## 2022-03-21 ENCOUNTER — Ambulatory Visit: Payer: Medicaid Other | Admitting: Nurse Practitioner

## 2022-03-24 ENCOUNTER — Ambulatory Visit (INDEPENDENT_AMBULATORY_CARE_PROVIDER_SITE_OTHER): Payer: 59 | Admitting: Nurse Practitioner

## 2022-03-24 ENCOUNTER — Encounter: Payer: Self-pay | Admitting: Nurse Practitioner

## 2022-03-24 VITALS — BP 190/92 | HR 82 | Temp 98.6°F | Ht 66.0 in | Wt 192.0 lb

## 2022-03-24 DIAGNOSIS — E782 Mixed hyperlipidemia: Secondary | ICD-10-CM | POA: Diagnosis not present

## 2022-03-24 DIAGNOSIS — I10 Essential (primary) hypertension: Secondary | ICD-10-CM | POA: Diagnosis not present

## 2022-03-24 DIAGNOSIS — Z0001 Encounter for general adult medical examination with abnormal findings: Secondary | ICD-10-CM | POA: Diagnosis not present

## 2022-03-24 DIAGNOSIS — K219 Gastro-esophageal reflux disease without esophagitis: Secondary | ICD-10-CM | POA: Diagnosis not present

## 2022-03-24 DIAGNOSIS — Z23 Encounter for immunization: Secondary | ICD-10-CM

## 2022-03-24 DIAGNOSIS — Z0000 Encounter for general adult medical examination without abnormal findings: Secondary | ICD-10-CM

## 2022-03-24 MED ORDER — TELMISARTAN 80 MG PO TABS: 80.0000 mg | ORAL_TABLET | Freq: Every day | ORAL | 0 refills | Status: DC

## 2022-03-24 NOTE — Assessment & Plan Note (Signed)
Hypertension not well controlled, patient is currently on 60 mg telmisartan.  I increased dose to 80 mg tablet by mouth daily.  Keep blood pressure log daily for 1 week, follow-up in 2 to 4 weeks.  If blood pressure is not maintained I will add hydrochlorothiazide.  Completed labs CBC, CMP lipid panel results pending.

## 2022-03-24 NOTE — Progress Notes (Signed)
New Patient Note  RE: Erika Ruiz MRN: [id number] DOB: [birth year] Date of Office Visit: 03/24/2022  Chief Complaint: Establish Care and Hypertension  History of Present Illness: Hypertension, follow-up  BP Readings from Last 3 Encounters:  03/24/22 (!) 190/92  09/17/20 (!) 145/76  03/13/17 (!) 149/91   Wt Readings from Last 3 Encounters:  03/24/22 192 lb (87.1 kg)  09/15/20 180 lb (81.6 kg)  03/13/17 195 lb (88.5 kg)     She was last seen for hypertension 6 months ago.  BP at that visit was 190/92. Management since that visit includes telmisartan 60 mg tablet by mouth daily..  She reports good compliance with treatment. She is not having side effects.  She is following a Low fat diet. She is not exercising. She does not smoke.  Use of agents associated with hypertension: none.   Outside blood pressures are .  Patient did not bring a blood pressure log to clinic today Symptoms: No chest pain No chest pressure  No palpitations No syncope  No dyspnea No orthopnea  No paroxysmal nocturnal dyspnea No lower extremity edema   Pertinent labs Lab Results  Component Value Date   CHOL 259 (H) 09/16/2020   HDL 36 (L) 09/16/2020   LDLCALC 176 (H) 09/16/2020   TRIG 235 (H) 09/16/2020   CHOLHDL 7.2 09/16/2020   Lab Results  Component Value Date   NA 137 09/15/2020   K 4.1 09/15/2020   CREATININE 0.83 09/15/2020   GFRNONAA >60 09/15/2020   GLUCOSE 124 (H) 09/15/2020     The ASCVD Risk score (Arnett DK, et al., 2019) failed to calculate for the following reasons:   The patient has a prior MI or stroke diagnosis  Lipid/Cholesterol, Follow-up  Last lipid panel Other pertinent labs  Lab Results  Component Value Date   CHOL 259 (H) 09/16/2020   HDL 36 (L) 09/16/2020   LDLCALC 176 (H) 09/16/2020   TRIG 235 (H) 09/16/2020   CHOLHDL 7.2 09/16/2020   Lab Results  Component Value Date   ALT 30 09/15/2020   AST 19 09/15/2020   PLT 318 09/15/2020     She was  last seen for this 6 months ago.  Management since that visit includes Zetia 10 mg tablet by mouth daily..  She reports good compliance with treatment. She is not having side effects.   Symptoms: No chest pain No chest pressure/discomfort  No dyspnea No lower extremity edema  No numbness or tingling of extremity No orthopnea  No palpitations No paroxysmal nocturnal dyspnea  No speech difficulty No syncope   Current diet: well balanced Current exercise: none  The ASCVD Risk score (Arnett DK, et al., 2019) failed to calculate for the following reasons:   The patient has a prior MI or stroke diagnosis  GERD, Follow up:  The patient was last seen for GERD 6 months ago. Changes made since that visit include none.  She reports good compliance with treatment. She is not having side effects. .  She IS experiencing  no new symptoms . She is NOT experiencing cough, deep pressure at base of neck, difficulty swallowing, dysphagia, early satiety, or fullness after meals  -----------------------------------------------------------------------------------------   Assessment and Plan: Erika Ruiz is a 56 y.o. female with: Essential hypertension Hypertension not well controlled, patient is currently on 60 mg telmisartan.  I increased dose to 80 mg tablet by mouth daily.  Keep blood pressure log daily for 1 week, follow-up in 2 to 4 weeks.  If blood  pressure is not maintained I will add hydrochlorothiazide.  Completed labs CBC, CMP lipid panel results pending.  Hyperlipidemia Completed labs results pending no new symptoms of hyperlipidemia.  GERD (gastroesophageal reflux disease) No new or worsening symptoms of GERD.  Return in about 6 months (around 09/22/2022) for chronic disease management.   Diagnostics:   Past Medical History: Patient Active Problem List   Diagnosis Date Noted   Acute ischemic stroke ([place]) 09/16/2020   Essential hypertension 09/16/2020   Chronic back pain 09/16/2020    GERD (gastroesophageal reflux disease) 09/16/2020   Hyperlipidemia 09/16/2020   Dyspepsia    Family history of colon cancer 02/14/2017   Abdominal pain 02/14/2017   Abdominal pain, epigastric 07/11/2012   Abdominal pain, left upper quadrant 07/11/2012   Family history of malignant neoplasm of gastrointestinal tract 07/11/2012   Past Medical History:  Diagnosis Date   Hyperlipemia    Hypertension    Uterine cancer ([place]) 1987   Vitamin D deficiency disease    Past Surgical History: Past Surgical History:  Procedure Laterality Date   ABDOMINAL HYSTERECTOMY     due to CA   BIOPSY  03/13/2017   Procedure: BIOPSY;  Surgeon: Danie Binder, MD;  [facility];  Service: Endoscopy;;  gastric   BREAST LUMPECTOMY Left    COLONOSCOPY WITH PROPOFOL N/A 03/13/2017   Procedure: COLONOSCOPY WITH PROPOFOL;  Surgeon: Danie Binder, MD;  Location: [facility];  Service: Endoscopy;  Laterality: N/A;  11:00am   ESOPHAGOGASTRODUODENOSCOPY (EGD) WITH PROPOFOL N/A 03/13/2017   Procedure: ESOPHAGOGASTRODUODENOSCOPY (EGD) WITH PROPOFOL;  Surgeon: Danie Binder, MD;  [facility];  Service: Endoscopy;  Laterality: N/A;   LEFT OOPHORECTOMY     POLYPECTOMY  03/13/2017   Procedure: POLYPECTOMY;  Surgeon: Danie Binder, MD;  [facility];  Service: Endoscopy;;  colon   VAGINAL DELIVERY     X2   Medication List:  Current Outpatient Medications  Medication Sig Dispense Refill   aspirin EC 81 MG EC tablet Take 1 tablet (81 mg total) by mouth daily. Swallow whole. X 90 days 30 tablet 11   cetirizine (ZYRTEC) 10 MG chewable tablet Chew 10 mg by mouth daily.     ezetimibe (ZETIA) 10 MG tablet Take 10 mg by mouth daily.     telmisartan (MICARDIS) 80 MG tablet Take 1 tablet (80 mg total) by mouth daily. 90 tablet 0   Oxycodone HCl 20 MG TABS Take 20 mg by mouth daily as needed (for pain).  (Patient not taking: Reported on 03/24/2022)  0   No current  facility-administered medications for this visit.   Allergies: Allergies  Allergen Reactions   Sulfa Antibiotics Rash   Social History: Social History   Socioeconomic History   Marital status: Married    Spouse name: Not on file   Number of children: 2   Years of education: Not on file   Highest education level: Not on file  Occupational History   Occupation: Market researcher    Employer: [employer]  Tobacco Use   Smoking status: Never   Smokeless tobacco: Never  Vaping Use   Vaping Use: Never used  Substance and Sexual Activity   Alcohol use: No    Alcohol/week: 0.0 standard drinks of alcohol   Drug use: No   Sexual activity: Yes  Other Topics Concern   Not on file  Social History Narrative   Not on file   Social Determinants of Health  Financial Resource Strain: Not on file  Food Insecurity: Not on file  Transportation Needs: Not on file  Physical Activity: Not on file  Stress: Not on file  Social Connections: Not on file       Family History: Family History  Problem Relation Age of Onset   Ovarian cancer Paternal Aunt 23   Colon cancer Sister 48   Colon polyps Brother    Diabetes Father    Heart disease Father    Kidney disease Father    Breast cancer Mother          Review of Systems  Constitutional: Negative.   HENT: Negative.    Eyes: Negative.   Respiratory: Negative.    Cardiovascular: Negative.   Gastrointestinal: Negative.   Genitourinary: Negative.   Musculoskeletal:  Negative for gait problem.  Skin:  Negative for rash.  Neurological:        Aphasia, history of stroke 09/16/2020  All other systems reviewed and are negative.  Objective: BP (!) 190/92   Pulse 82   Temp 98.6 F (37 C)   Ht '5\' 6"'$  (1.676 m)   Wt 192 lb (87.1 kg)   SpO2 100%   BMI 30.99 kg/m  Body mass index is 30.99 kg/m. Physical Exam Vitals and nursing note reviewed.  Constitutional:      Appearance: Normal appearance.  HENT:     Right Ear:  External ear normal.     Left Ear: External ear normal.     Nose: Nose normal.     Mouth/Throat:     Mouth: Mucous membranes are moist.     Pharynx: Oropharynx is clear.  Eyes:     Conjunctiva/sclera: Conjunctivae normal.  Cardiovascular:     Pulses: Normal pulses.     Heart sounds: Normal heart sounds.  Pulmonary:     Effort: Pulmonary effort is normal.     Breath sounds: Normal breath sounds.  Abdominal:     General: Bowel sounds are normal.  Skin:    General: Skin is warm.  Neurological:     General: No focal deficit present.     Mental Status: She is alert and oriented to person, place, and time. Mental status is at baseline.     Comments: Aphasia. History of stroke 2022    The plan was reviewed with the patient/family, and all questions/concerned were addressed.  It was my pleasure to see Erika Ruiz today and participate in her care. Please feel free to contact me with any questions or concerns.  Sincerely,  Jac Canavan NP [place]

## 2022-03-24 NOTE — Assessment & Plan Note (Signed)
No new or worsening symptoms of GERD.

## 2022-03-24 NOTE — Patient Instructions (Signed)
Please schedule patient for complete physical Follow up for hypertension 2-4 weeks (changed medication dose) Schedule patient Mammogram [facility] Bus Follow up 6 months chronic disease management  Hypertension, Adult Hypertension is another name for high blood pressure. High blood pressure forces your heart to work harder to pump blood. This can cause problems over time. There are two numbers in a blood pressure reading. There is a top number (systolic) over a bottom number (diastolic). It is best to have a blood pressure that is below 120/80. What are the causes? The cause of this condition is not known. Some other conditions can lead to high blood pressure. What increases the risk? Some lifestyle factors can make you more likely to develop high blood pressure: Smoking. Not getting enough exercise or physical activity. Being overweight. Having too much fat, sugar, calories, or salt (sodium) in your diet. Drinking too much alcohol. Other risk factors include: Having any of these conditions: Heart disease. Diabetes. High cholesterol. Kidney disease. Obstructive sleep apnea. Having a family history of high blood pressure and high cholesterol. Age. The risk increases with age. Stress. What are the signs or symptoms? High blood pressure may not cause symptoms. Very high blood pressure (hypertensive crisis) may cause: Headache. Fast or uneven heartbeats (palpitations). Shortness of breath. Nosebleed. Vomiting or feeling like you may vomit (nauseous). Changes in how you see. Very bad chest pain. Feeling dizzy. Seizures. How is this treated? This condition is treated by making healthy lifestyle changes, such as: Eating healthy foods. Exercising more. Drinking less alcohol. Your doctor may prescribe medicine if lifestyle changes do not help enough and if: Your top number is above 130. Your bottom number is above 80. Your personal target blood pressure may vary. Follow these  instructions at home: Eating and drinking  If told, follow the DASH eating plan. To follow this plan: Fill one half of your plate at each meal with fruits and vegetables. Fill one fourth of your plate at each meal with whole grains. Whole grains include whole-wheat pasta, brown rice, and whole-grain bread. Eat or drink low-fat dairy products, such as skim milk or low-fat yogurt. Fill one fourth of your plate at each meal with low-fat (lean) proteins. Low-fat proteins include fish, chicken without skin, eggs, beans, and tofu. Avoid fatty meat, cured and processed meat, or chicken with skin. Avoid pre-made or processed food. Limit the amount of salt in your diet to less than 1,500 mg each day. Do not drink alcohol if: Your doctor tells you not to drink. You are pregnant, may be pregnant, or are planning to become pregnant. If you drink alcohol: Limit how much you have to: 0-1 drink a day for women. 0-2 drinks a day for men. Know how much alcohol is in your drink. In the [place] one drink equals one 12 oz bottle of beer (355 mL), one 5 oz glass of wine (148 mL), or one 1 oz glass of hard liquor (44 mL). Lifestyle  Work with your doctor to stay at a healthy weight or to lose weight. Ask your doctor what the best weight is for you. Get at least 30 minutes of exercise that causes your heart to beat faster (aerobic exercise) most days of the week. This may include walking, swimming, or biking. Get at least 30 minutes of exercise that strengthens your muscles (resistance exercise) at least 3 days a week. This may include lifting weights or doing Pilates. Do not smoke or use any products that contain nicotine or tobacco.  If you need help quitting, ask your doctor. Check your blood pressure at home as told by your doctor. Keep all follow-up visits. Medicines Take over-the-counter and prescription medicines only as told by your doctor. Follow directions carefully. Do not skip doses of blood pressure  medicine. The medicine does not work as well if you skip doses. Skipping doses also puts you at risk for problems. Ask your doctor about side effects or reactions to medicines that you should watch for. Contact a doctor if: You think you are having a reaction to the medicine you are taking. You have headaches that keep coming back. You feel dizzy. You have swelling in your ankles. You have trouble with your vision. Get help right away if: You get a very bad headache. You start to feel mixed up (confused). You feel weak or numb. You feel faint. You have very bad pain in your: Chest. Belly (abdomen). You vomit more than once. You have trouble breathing. These symptoms may be an emergency. Get help right away. Call 911. Do not wait to see if the symptoms will go away. Do not drive yourself to the hospital. Summary Hypertension is another name for high blood pressure. High blood pressure forces your heart to work harder to pump blood. For most people, a normal blood pressure is less than 120/80. Making healthy choices can help lower blood pressure. If your blood pressure does not get lower with healthy choices, you may need to take medicine. This information is not intended to replace advice given to you by your health care provider. Make sure you discuss any questions you have with your health care provider. Document Revised: 02/17/2021 Document Reviewed: 02/17/2021 Elsevier Patient Education  [place].

## 2022-03-24 NOTE — Assessment & Plan Note (Signed)
Completed labs results pending no new symptoms of hyperlipidemia.

## 2022-03-25 LAB — CMP14+EGFR
ALT: 42 IU/L — ABNORMAL HIGH (ref 0–32)
AST: 23 IU/L (ref 0–40)
Albumin/Globulin Ratio: 1.8 (ref 1.2–2.2)
Albumin: 4.6 g/dL (ref 3.8–4.9)
Alkaline Phosphatase: 123 IU/L — ABNORMAL HIGH (ref 44–121)
BUN/Creatinine Ratio: 12 (ref 9–23)
BUN: 9 mg/dL (ref 6–24)
Bilirubin Total: 0.5 mg/dL (ref 0.0–1.2)
CO2: 26 mmol/L (ref 20–29)
Calcium: 9.5 mg/dL (ref 8.7–10.2)
Chloride: 104 mmol/L (ref 96–106)
Creatinine, Ser: 0.73 mg/dL (ref 0.57–1.00)
Globulin, Total: 2.6 g/dL (ref 1.5–4.5)
Glucose: 90 mg/dL (ref 70–99)
Potassium: 4.4 mmol/L (ref 3.5–5.2)
Sodium: 141 mmol/L (ref 134–144)
Total Protein: 7.2 g/dL (ref 6.0–8.5)
eGFR: 97 mL/min/{1.73_m2} (ref >59)

## 2022-03-25 LAB — CBC WITH DIFFERENTIAL/PLATELET
Basophils Absolute: 0 10*3/uL (ref 0.0–0.2)
Basos: 1 %
EOS (ABSOLUTE): 0.1 10*3/uL (ref 0.0–0.4)
Eos: 1 %
Hematocrit: 43.7 % (ref 34.0–46.6)
Hemoglobin: 13.9 g/dL (ref 11.1–15.9)
Immature Grans (Abs): 0 10*3/uL (ref 0.0–0.1)
Immature Granulocytes: 0 %
Lymphocytes Absolute: 2.2 10*3/uL (ref 0.7–3.1)
Lymphs: 36 %
MCH: 28.5 pg (ref 26.6–33.0)
MCHC: 31.8 g/dL (ref 31.5–35.7)
MCV: 90 fL (ref 79–97)
Monocytes Absolute: 0.5 10*3/uL (ref 0.1–0.9)
Monocytes: 8 %
Neutrophils Absolute: 3.2 10*3/uL (ref 1.4–7.0)
Neutrophils: 54 %
Platelets: 298 10*3/uL (ref 150–450)
RBC: 4.87 x10E6/uL (ref 3.77–5.28)
RDW: 13.3 % (ref 11.7–15.4)
WBC: 6 10*3/uL (ref 3.4–10.8)

## 2022-03-25 LAB — LIPID PANEL
Chol/HDL Ratio: 4.9 ratio — ABNORMAL HIGH (ref 0.0–4.4)
Cholesterol, Total: 181 mg/dL (ref 100–199)
HDL: 37 mg/dL — ABNORMAL LOW (ref >39)
LDL Chol Calc (NIH): 111 mg/dL — ABNORMAL HIGH (ref 0–99)
Triglycerides: 185 mg/dL — ABNORMAL HIGH (ref 0–149)
VLDL Cholesterol Cal: 33 mg/dL (ref 5–40)

## 2022-03-26 ENCOUNTER — Other Ambulatory Visit: Payer: Self-pay | Admitting: Nurse Practitioner

## 2022-03-26 MED ORDER — OMEGA-3 FATTY ACIDS 1000 MG PO CAPS: 2.0000 g | ORAL_CAPSULE | Freq: Every day | ORAL | 2 refills | Status: DC

## 2022-04-04 ENCOUNTER — Other Ambulatory Visit: Payer: Self-pay | Admitting: Nurse Practitioner

## 2022-04-04 DIAGNOSIS — Z23 Encounter for immunization: Secondary | ICD-10-CM

## 2022-04-04 DIAGNOSIS — I10 Essential (primary) hypertension: Secondary | ICD-10-CM

## 2022-04-04 DIAGNOSIS — K219 Gastro-esophageal reflux disease without esophagitis: Secondary | ICD-10-CM

## 2022-04-04 DIAGNOSIS — E782 Mixed hyperlipidemia: Secondary | ICD-10-CM

## 2022-04-04 DIAGNOSIS — Z0000 Encounter for general adult medical examination without abnormal findings: Secondary | ICD-10-CM

## 2022-04-10 ENCOUNTER — Ambulatory Visit
Admission: RE | Admit: 2022-04-10 | Discharge: 2022-04-10 | Disposition: A | Discharge status: Home / Self Care | Payer: 59 | Source: Ambulatory Visit | Attending: Nurse Practitioner | Admitting: Nurse Practitioner

## 2022-04-10 DIAGNOSIS — Z1231 Encounter for screening mammogram for malignant neoplasm of breast: Secondary | ICD-10-CM | POA: Diagnosis not present

## 2022-04-10 DIAGNOSIS — Z0000 Encounter for general adult medical examination without abnormal findings: Secondary | ICD-10-CM

## 2022-05-26 DIAGNOSIS — N3001 Acute cystitis with hematuria: Secondary | ICD-10-CM | POA: Diagnosis not present

## 2022-05-26 DIAGNOSIS — R109 Unspecified abdominal pain: Secondary | ICD-10-CM | POA: Diagnosis not present

## 2022-05-30 ENCOUNTER — Encounter: Payer: Self-pay | Admitting: *Deleted

## 2022-06-06 ENCOUNTER — Other Ambulatory Visit: Payer: Self-pay | Admitting: *Deleted

## 2022-06-07 MED ORDER — EZETIMIBE 10 MG PO TABS: 10.0000 mg | ORAL_TABLET | Freq: Every day | ORAL | 1 refills | Status: DC

## 2022-06-28 DIAGNOSIS — J329 Chronic sinusitis, unspecified: Secondary | ICD-10-CM | POA: Diagnosis not present

## 2022-07-21 ENCOUNTER — Emergency Department (HOSPITAL_COMMUNITY): Payer: Medicaid Other

## 2022-07-21 ENCOUNTER — Encounter (HOSPITAL_COMMUNITY): Payer: Self-pay | Admitting: Emergency Medicine

## 2022-07-21 ENCOUNTER — Emergency Department (HOSPITAL_COMMUNITY)
Admission: EM | Admit: 2022-07-21 | Discharge: 2022-07-21 | Disposition: A | Discharge status: Home / Self Care | Payer: Medicaid Other | Attending: Emergency Medicine | Admitting: Emergency Medicine

## 2022-07-21 ENCOUNTER — Other Ambulatory Visit: Payer: Self-pay

## 2022-07-21 DIAGNOSIS — I10 Essential (primary) hypertension: Secondary | ICD-10-CM | POA: Insufficient documentation

## 2022-07-21 DIAGNOSIS — Z7982 Long term (current) use of aspirin: Secondary | ICD-10-CM | POA: Insufficient documentation

## 2022-07-21 DIAGNOSIS — R42 Dizziness and giddiness: Secondary | ICD-10-CM | POA: Insufficient documentation

## 2022-07-21 DIAGNOSIS — Z79899 Other long term (current) drug therapy: Secondary | ICD-10-CM | POA: Insufficient documentation

## 2022-07-21 DIAGNOSIS — Z8673 Personal history of transient ischemic attack (TIA), and cerebral infarction without residual deficits: Secondary | ICD-10-CM | POA: Insufficient documentation

## 2022-07-21 DIAGNOSIS — R519 Headache, unspecified: Secondary | ICD-10-CM | POA: Diagnosis not present

## 2022-07-21 DIAGNOSIS — I6381 Other cerebral infarction due to occlusion or stenosis of small artery: Secondary | ICD-10-CM | POA: Diagnosis not present

## 2022-07-21 LAB — COMPREHENSIVE METABOLIC PANEL
ALT: 47 U/L — ABNORMAL HIGH (ref 0–44)
AST: 32 U/L (ref 15–41)
Albumin: 4.5 g/dL (ref 3.5–5.0)
Alkaline Phosphatase: 109 U/L (ref 38–126)
Anion gap: 9 (ref 5–15)
BUN: 11 mg/dL (ref 6–20)
CO2: 25 mmol/L (ref 22–32)
Calcium: 9.3 mg/dL (ref 8.9–10.3)
Chloride: 104 mmol/L (ref 98–111)
Creatinine, Ser: 0.76 mg/dL (ref 0.44–1.00)
GFR, Estimated: >60 mL/min (ref >60)
Glucose, Bld: 119 mg/dL — ABNORMAL HIGH (ref 70–99)
Potassium: 3.5 mmol/L (ref 3.5–5.1)
Sodium: 138 mmol/L (ref 135–145)
Total Bilirubin: 0.4 mg/dL (ref 0.3–1.2)
Total Protein: 8.4 g/dL — ABNORMAL HIGH (ref 6.5–8.1)

## 2022-07-21 LAB — DIFFERENTIAL
Abs Immature Granulocytes: 0.02 10*3/uL (ref 0.00–0.07)
Basophils Absolute: 0 10*3/uL (ref 0.0–0.1)
Basophils Relative: 1 %
Eosinophils Absolute: 0.1 10*3/uL (ref 0.0–0.5)
Eosinophils Relative: 2 %
Immature Granulocytes: 0 %
Lymphocytes Relative: 42 %
Lymphs Abs: 3.6 10*3/uL (ref 0.7–4.0)
Monocytes Absolute: 0.8 10*3/uL (ref 0.1–1.0)
Monocytes Relative: 9 %
Neutro Abs: 3.9 10*3/uL (ref 1.7–7.7)
Neutrophils Relative %: 46 %

## 2022-07-21 LAB — PROTIME-INR
INR: 1 (ref 0.8–1.2)
Prothrombin Time: 12.6 seconds (ref 11.4–15.2)

## 2022-07-21 LAB — CBC
HCT: 45.5 % (ref 36.0–46.0)
Hemoglobin: 15.2 g/dL — ABNORMAL HIGH (ref 12.0–15.0)
MCH: 30.3 pg (ref 26.0–34.0)
MCHC: 33.4 g/dL (ref 30.0–36.0)
MCV: 90.8 fL (ref 80.0–100.0)
Platelets: 304 10*3/uL (ref 150–400)
RBC: 5.01 MIL/uL (ref 3.87–5.11)
RDW: 13.1 % (ref 11.5–15.5)
WBC: 8.5 10*3/uL (ref 4.0–10.5)
nRBC: 0 % (ref 0.0–0.2)

## 2022-07-21 LAB — POC URINE PREG, ED: Preg Test, Ur: NEGATIVE

## 2022-07-21 LAB — APTT: aPTT: 29 seconds (ref 24–36)

## 2022-07-21 LAB — GLUCOSE, CAPILLARY: Glucose-Capillary: 118 mg/dL — ABNORMAL HIGH (ref 70–99)

## 2022-07-21 LAB — ETHANOL: Alcohol, Ethyl (B): <10 mg/dL (ref <10)

## 2022-07-21 MED ORDER — SODIUM CHLORIDE 0.9% FLUSH: 3.0000 mL | Freq: Once | INTRAVENOUS | Status: DC

## 2022-07-21 NOTE — ED Triage Notes (Signed)
Pt via POV with husband who states pt had a stroke about a year ago with residual slurred speech. Today pt noted that she felt dizzy and had a headache. HTN noted in triage BP 202/116. No new neuro deficits per pt and spouse.

## 2022-07-21 NOTE — ED Provider Notes (Signed)
[facility] Provider Note   CSN: [id number] Arrival date & time: 07/21/22  1835     History  Chief Complaint  Patient presents with   Dizziness   Headache   HPI EVENY Ruiz is a 57 y.o. female with history of ischemic stroke 2 years ago, hyperlipidemia and hypertension presenting for dizziness.  Symptoms started around 1230 this afternoon. She was sitting down on started to have a mild headache along with dizziness.  Denies loss of balance or room spinning sensation. Denies improvement with change of position. States her symptoms were persistent throughout the afternoon she took a Dramamine and symptoms improved. States at this time she is neither dizzy or having headache.  States that given her stroke 2 years ago she did want to be evaluated which prompted her presentation today. Denies chest pain shortness of breath.  States she does take telmisartan for blood pressure and to take her medication today. Endorses intermittent dry cough but no recent URI.    Dizziness Associated symptoms: headaches   Headache Associated symptoms: dizziness        Home Medications Prior to Admission medications   Medication Sig Start Date End Date Taking? Authorizing Provider  aspirin EC 81 MG EC tablet Take 1 tablet (81 mg total) by mouth daily. Swallow whole. X 90 days 09/18/20   Orson Eva, MD  cetirizine (ZYRTEC) 10 MG chewable tablet Chew 10 mg by mouth daily.    [provider]  ezetimibe (ZETIA) 10 MG tablet Take 1 tablet (10 mg total) by mouth daily. 06/07/22   Gwenlyn Perking, FNP  fish oil-omega-3 fatty acids 1000 MG capsule Take 2 capsules (2 g total) by mouth daily. 03/26/22   Ivy Lynn, NP  Oxycodone HCl 20 MG TABS Take 20 mg by mouth daily as needed (for pain).  Patient not taking: Reported on 03/24/2022 02/09/17   [provider]  telmisartan (MICARDIS) 80 MG tablet Take 1 tablet (80 mg total) by mouth daily.  03/24/22   Ivy Lynn, NP      Allergies    Sulfa antibiotics    Review of Systems   Review of Systems  Neurological:  Positive for dizziness and headaches.    Physical Exam   Vitals:   07/21/22 1851 07/21/22 2019  BP: (!) 202/116 (!) 168/93  Pulse: (!) 108 89  Resp: 18 16  Temp: 98.3 F (36.8 C)   SpO2: 98% 97%    CONSTITUTIONAL:  well-appearing, NAD NEURO:  GCS 15. Speech is goal oriented. No deficits appreciated to CN III-XII; symmetric eyebrow raise, no facial drooping, tongue midline. Patient has equal grip strength bilaterally with 5/5 strength against resistance in all major muscle groups bilaterally. Sensation to light touch intact. Patient moves extremities without ataxia. Normal finger-nose-finger. Patient ambulatory with steady gait. EYES:  eyes equal and reactive ENT/NECK:  Supple, no stridor  CARDIO:  regular rate and rhythm, appears well-perfused  PULM:  No respiratory distress, CTAB GI/GU:  non-distended MSK/SPINE:  No gross deformities, no edema, moves all extremities  SKIN:  no rash, atraumatic   *Additional and/or pertinent findings included in MDM below    ED Results / Procedures / Treatments   Labs (all labs ordered are listed, but only abnormal results are displayed) Labs Reviewed  CBC - Abnormal; Notable for the following components:      Result Value   Hemoglobin 15.2 (*)    All other components within normal limits  COMPREHENSIVE METABOLIC PANEL - Abnormal; Notable for the following components:   Glucose, Bld 119 (*)    Total Protein 8.4 (*)    ALT 47 (*)    All other components within normal limits  GLUCOSE, CAPILLARY - Abnormal; Notable for the following components:   Glucose-Capillary 118 (*)    All other components within normal limits  PROTIME-INR  APTT  DIFFERENTIAL  ETHANOL  CBG MONITORING, ED  POC URINE PREG, ED    EKG EKG Interpretation  Date/Time:  Friday July 21 2022 19:05:42 EST Ventricular Rate:  114 PR  Interval:  158 QRS Duration: 86 QT Interval:  344 QTC Calculation: 474 R Axis:   92 Text Interpretation: Sinus tachycardia Rightward axis Borderline ECG When compared with ECG of 15-Sep-2020 21:48, No significant change since last tracing Confirmed by Aletta Edouard [phone]) on 07/21/2022 7:19:53 PM  Radiology CT HEAD WO CONTRAST  Result Date: 07/21/2022 CLINICAL DATA:  History of stroke about a year ago with residual slurred speech, presenting with dizziness and headache. EXAM: CT HEAD WITHOUT CONTRAST TECHNIQUE: Contiguous axial images were obtained from the base of the skull through the vertex without intravenous contrast. RADIATION DOSE REDUCTION: This exam was performed according to the departmental dose-optimization program which includes automated exposure control, adjustment of the mA and/or kV according to patient size and/or use of iterative reconstruction technique. COMPARISON:  Sep 15, 2020 FINDINGS: Brain: There is mild, stable cerebral atrophy with widening of the extra-axial spaces and ventricular dilatation. This is mildly asymmetric in nature, slightly more prominent throughout the left hemisphere. There are areas of decreased attenuation within the white matter tracts of the supratentorial brain, consistent with microvascular disease changes. A chronic left posterior parietal lobe infarct is noted. A chronic left basal ganglia lacunar infarct is also seen. Vascular: No hyperdense vessel or unexpected calcification. Skull: Normal. Negative for fracture or focal lesion. Sinuses/Orbits: No acute finding. Other: None. IMPRESSION: 1. No acute intracranial abnormality. 2. Chronic left posterior parietal lobe and left basal ganglia infarcts. 3. Generalized cerebral atrophy with widening of the extra-axial spaces and ventricular dilatation, slightly more prominent throughout the left hemisphere. Electronically Signed   By: Virgina Norfolk M.D.   On: 07/21/2022 19:38    Procedures Procedures     Medications Ordered in ED Medications  sodium chloride flush (NS) 0.9 % injection 3 mL (has no administration in time range)    ED Course/ Medical Decision Making/ A&P                             Medical Decision Making Amount and/or Complexity of Data Reviewed Labs: ordered. Radiology: ordered.   Initial Impression and Ddx 57 year old female who is well-appearing and hemodynamically stable presenting for dizziness.  Exam unremarkable.  DDx includes stroke, arrhythmia, peripheral vertigo, hypertensive emergency and metabolic derangement. Patient PMH that increases complexity of ED encounter: Ischemic stroke 2 years ago, hypertension and hyperlipidemia ,  Interpretation of Diagnostics I independent reviewed and interpreted the labs as followed: Hyperglycemia  - I independently visualized the following imaging with scope of interpretation limited to determining acute life threatening conditions related to emergency care: CT head, which revealed no acute intracranial pathology  -I personally reviewed and interpreted EKG which revealed sinus tachycardia  Patient Reassessment and Ultimate Disposition/Management On initial assessment patient stated that her symptoms completely abated before she arrived.  Patient was primarily concerned given her history of stroke that she may be having another  one.  We discussed her stroke symptoms previously which were facial droop, aphasia and severe headache.  Given that she does not have symptoms at this time, CT was unremarkable and there was no focal neurodeficit on exam felt overall that stroke was unlikely at this time.  Dizziness possibly peripheral given that it was improved with Dramamine.  Reassessed patient a couple hours later and patient continued to deny headache or dizziness or any visual disturbance.  Walked around the room and she had no complications with a normal gait.  Discharged with normal vitals.  Advised her to follow-up with her  PCP.  Discussed return precautions.  Also considered hypertensive emergency but unlikely given no shortness of breath, chest pain or abdominal pain.  Blood pressure also improved without intervention.  Advised her to follow-up with her PCP with her elevated blood pressure and to continue to her take her BP meds as prescribed in the meantime.  Patient management required discussion with the following services or consulting groups:  None  Complexity of Problems Addressed Acute complicated illness or Injury  Additional Data Reviewed and Analyzed Further history obtained from: Past medical history and medications listed in the EMR, Prior ED visit notes, and Recent discharge summary  Patient Encounter Risk Assessment None         Final Clinical Impression(s) / ED Diagnoses Final diagnoses:  Dizziness    Rx / DC Orders ED Discharge Orders     None         Harriet Pho, PA-C 07/21/22 2231    Hayden Rasmussen, MD 07/22/22 1049

## 2022-07-21 NOTE — Discharge Instructions (Addendum)
Evaluation for your dizziness and headache was overall reassuring.  Given how clinically well you are at this time and that your symptoms have improved with Dramamine it is unlikely that you are having a stroke also your CT scan was negative.  Nevertheless I do feel like you should follow-up with your PCP.  If you have new facial droop, slurred speech, worsening headache, extremity weakness or numbness or any other concerning symptom please return the emergency department further evaluation.

## 2022-07-25 ENCOUNTER — Ambulatory Visit (INDEPENDENT_AMBULATORY_CARE_PROVIDER_SITE_OTHER): Payer: Medicaid Other | Admitting: Family Medicine

## 2022-07-25 ENCOUNTER — Encounter: Payer: Self-pay | Admitting: Family Medicine

## 2022-07-25 VITALS — BP 145/73 | HR 74 | Temp 98.6°F | Ht 66.0 in | Wt 192.6 lb

## 2022-07-25 DIAGNOSIS — Z8673 Personal history of transient ischemic attack (TIA), and cerebral infarction without residual deficits: Secondary | ICD-10-CM | POA: Diagnosis not present

## 2022-07-25 DIAGNOSIS — I6930 Unspecified sequelae of cerebral infarction: Secondary | ICD-10-CM | POA: Insufficient documentation

## 2022-07-25 DIAGNOSIS — I10 Essential (primary) hypertension: Secondary | ICD-10-CM

## 2022-07-25 NOTE — Progress Notes (Signed)
Subjective: CC: ER follow-up PCP: Patient, No Pcp Per PF:[id number] B Ying is a 57 y.o. female presenting to clinic today for:  1.  ER follow-up Patient here for ER follow-up.  She is accompanied today by her spouse.  She apparently had dizziness and headaches and was quite worried that she may have had a recurrent stroke.  She had CVA about a year and a half ago and presented somewhat similarly.  They evaluated her for this and did not find any recurrent stroke.  Her blood pressures were elevated there but she notes they are always elevated when she is in a medical facility.  She monitors these very closely at home and they typically run 120s to 140s over 70s.  She has a reliable blood pressure monitor as she is brought this to previous visits to ensure that it is appropriately calibrated.  She has not had any recurrent dizziness or headaches.  She needs a referral to a new neurologist as her previous one left from [place].  She has ongoing residual issues from her previous stroke which is predominantly difficulty with finding words.   ROS: Per HPI  Allergies  Allergen Reactions   Sulfa Antibiotics Rash   Past Medical History:  Diagnosis Date   Hyperlipemia    Hypertension    Uterine cancer ([place]) 1987   Vitamin D deficiency disease     Current Outpatient Medications:    aspirin EC 81 MG EC tablet, Take 1 tablet (81 mg total) by mouth daily. Swallow whole. X 90 days, Disp: 30 tablet, Rfl: 11   cetirizine (ZYRTEC) 10 MG chewable tablet, Chew 10 mg by mouth daily., Disp: , Rfl:    ezetimibe (ZETIA) 10 MG tablet, Take 1 tablet (10 mg total) by mouth daily., Disp: 90 tablet, Rfl: 1   telmisartan (MICARDIS) 80 MG tablet, Take 1 tablet (80 mg total) by mouth daily., Disp: 90 tablet, Rfl: 0 Social History   Socioeconomic History   Marital status: Married    Spouse name: Not on file   Number of children: 2   Years of education: Not on file   Highest education level: Not on file   Occupational History   Occupation: Market researcher    Employer: [employer]  Tobacco Use   Smoking status: Never   Smokeless tobacco: Never  Vaping Use   Vaping Use: Never used  Substance and Sexual Activity   Alcohol use: No    Alcohol/week: 0.0 standard drinks of alcohol   Drug use: No   Sexual activity: Yes  Other Topics Concern   Not on file  Social History Narrative   Not on file   Social Determinants of Health   Financial Resource Strain: Not on file  Food Insecurity: Not on file  Transportation Needs: Not on file  Physical Activity: Not on file  Stress: Not on file  Social Connections: Not on file  Intimate Partner Violence: Not on file   Family History  Problem Relation Age of Onset   Ovarian cancer Paternal Aunt 68   Colon cancer Sister 47   Colon polyps Brother    Diabetes Father    Heart disease Father    Kidney disease Father    Breast cancer Mother     Objective: Office vital signs reviewed. BP (!) 145/73 Comment: home bp  Pulse 74   Temp 98.6 F (37 C)   Ht '5\' 6"'$  (1.676 m)   Wt 192 lb 9.6 oz (87.4 kg)   SpO2  96%   BMI 31.09 kg/m   Physical Examination:  General: Awake, alert, nontoxic female, No acute distress HEENT: Sclera white.  Moist mucous membranes.  EOMI.  PERRLA. Cardio: regular rate and rhythm, S1S2 heard, no murmurs appreciated Pulm: clear to auscultation bilaterally, no wheezes, rhonchi or rales; normal work of breathing on room air NEURO: Some visible word finding difficulty but no other focal neurologic deficits appreciated  Assessment/ Plan: 57 y.o. female   White coat syndrome with hypertension  History of stroke - Plan: Ambulatory referral to Neurology  Blood pressure is certainly elevated.  I discussed with both she and her husband that if blood pressures remain above [id number] systolic that I would recommend that they advance her blood pressure medication regimen.  They will continue to monitor this very closely and  have follow-up to establish care with new PCP on May 10.  If blood pressure remains above 140 at home they will contact us sooner than that visit to augment medication regimen  Referral to new neurologist placed.  No orders of the defined types were placed in this encounter.  No orders of the defined types were placed in this encounter.    Janora Norlander, DO [place] [phone]

## 2022-09-04 ENCOUNTER — Other Ambulatory Visit: Payer: Self-pay | Admitting: Family Medicine

## 2022-09-22 ENCOUNTER — Ambulatory Visit: Payer: 59 | Admitting: Nurse Practitioner

## 2022-09-22 ENCOUNTER — Encounter: Payer: Self-pay | Admitting: Family Medicine

## 2022-09-22 ENCOUNTER — Ambulatory Visit: Payer: Medicaid Other | Admitting: Family Medicine

## 2022-09-22 VITALS — BP 127/68 | HR 73 | Temp 97.9°F | Ht 66.0 in | Wt 192.4 lb

## 2022-09-22 DIAGNOSIS — R5383 Other fatigue: Secondary | ICD-10-CM

## 2022-09-22 DIAGNOSIS — E782 Mixed hyperlipidemia: Secondary | ICD-10-CM

## 2022-09-22 DIAGNOSIS — Z1159 Encounter for screening for other viral diseases: Secondary | ICD-10-CM

## 2022-09-22 DIAGNOSIS — E6609 Other obesity due to excess calories: Secondary | ICD-10-CM

## 2022-09-22 DIAGNOSIS — Z6831 Body mass index (BMI) 31.0-31.9, adult: Secondary | ICD-10-CM

## 2022-09-22 DIAGNOSIS — F419 Anxiety disorder, unspecified: Secondary | ICD-10-CM | POA: Diagnosis not present

## 2022-09-22 DIAGNOSIS — E559 Vitamin D deficiency, unspecified: Secondary | ICD-10-CM | POA: Diagnosis not present

## 2022-09-22 DIAGNOSIS — Z8673 Personal history of transient ischemic attack (TIA), and cerebral infarction without residual deficits: Secondary | ICD-10-CM

## 2022-09-22 DIAGNOSIS — I10 Essential (primary) hypertension: Secondary | ICD-10-CM | POA: Diagnosis not present

## 2022-09-22 LAB — CMP14+EGFR
ALT: 39 IU/L — ABNORMAL HIGH (ref 0–32)
AST: 26 IU/L (ref 0–40)
Albumin/Globulin Ratio: 1.7 (ref 1.2–2.2)
Albumin: 4.4 g/dL (ref 3.8–4.9)
Alkaline Phosphatase: 124 IU/L — ABNORMAL HIGH (ref 44–121)
BUN/Creatinine Ratio: 15 (ref 9–23)
BUN: 12 mg/dL (ref 6–24)
Bilirubin Total: 0.5 mg/dL (ref 0.0–1.2)
CO2: 22 mmol/L (ref 20–29)
Calcium: 9.5 mg/dL (ref 8.7–10.2)
Chloride: 102 mmol/L (ref 96–106)
Creatinine, Ser: 0.82 mg/dL (ref 0.57–1.00)
Globulin, Total: 2.6 g/dL (ref 1.5–4.5)
Glucose: 103 mg/dL — ABNORMAL HIGH (ref 70–99)
Potassium: 4.4 mmol/L (ref 3.5–5.2)
Sodium: 141 mmol/L (ref 134–144)
Total Protein: 7 g/dL (ref 6.0–8.5)
eGFR: 84 mL/min/{1.73_m2} (ref >59)

## 2022-09-22 MED ORDER — BUSPIRONE HCL 5 MG PO TABS: 5.0000 mg | ORAL_TABLET | Freq: Two times a day (BID) | ORAL | 3 refills | Status: DC

## 2022-09-22 NOTE — Progress Notes (Signed)
Established Patient Office Visit  Subjective   Patient ID: Erika Ruiz, female    DOB: 10-06-1965  Age: 57 y.o. MRN: [id number]  Chief Complaint  Patient presents with   Medical Management of Chronic Issues   Hypertension   Hyperlipidemia    HPI  HTN Complaint with meds - Yes Current Medications - telmisartan Checking BP at home ranging 120s/60s Pertinent ROS:  Headache - No Fatigue - see below Visual Disturbances - No Chest pain - No Dyspnea - No Palpitations - No LE edema - No  2. HLD Previously on a statin, but had joint pain and myalgias. She was switched to zetia years ago for this. She is fasting today for labs.   3. Fatigue She reports feeling generally tired most days. She only sleeps abut 5 hours a night. She has trouble sleep due to her anxiety. She has not tried any OTC sleep aids or medications for anxiety before.      09/22/2022    8:26 AM 07/25/2022    2:03 PM 03/24/2022   11:33 AM  Depression screen PHQ 2/9  Decreased Interest 0 0 0  Down, Depressed, Hopeless 0 0 0  PHQ - 2 Score 0 0 0  Altered sleeping 0 0 0  Tired, decreased energy 3 3 0  Change in appetite 3 0 0  Feeling bad or failure about yourself  0 2 0  Trouble concentrating 2 1 0  Moving slowly or fidgety/restless 0 1 0  Suicidal thoughts 0 0 0  PHQ-9 Score 8 7 0  Difficult doing work/chores Not difficult at all Somewhat difficult Not difficult at all      09/22/2022    8:26 AM 07/25/2022    2:03 PM 03/24/2022   11:33 AM  GAD 7 : Generalized Anxiety Score  Nervous, Anxious, on Edge 0 0 0  Control/stop worrying 3 1 0  Worry too much - different things 3 3 0  Trouble relaxing 3 0 0  Restless 0 0 0  Easily annoyed or irritable 3 3 0  Afraid - awful might happen 0 0 0  Total GAD 7 Score 12 7 0  Anxiety Difficulty Not difficult at all Somewhat difficult Not difficult at all       Past Medical History:  Diagnosis Date   Hyperlipemia    Hypertension    Uterine cancer  ([facility]) 1987   Vitamin D deficiency disease       ROS As per HPI.   Objective:     BP 127/68 Comment: at home reading per pt  Pulse 73   Temp 97.9 F (36.6 C) (Temporal)   Ht 5\' 6"  (1.676 m)   Wt 192 lb 6 oz (87.3 kg)   SpO2 96%   BMI 31.05 kg/m    Physical Exam Vitals and nursing note reviewed.  Constitutional:      General: She is not in acute distress.    Appearance: She is obese. She is not ill-appearing, toxic-appearing or diaphoretic.  HENT:     Head: Normocephalic and atraumatic.     Mouth/Throat:     Mouth: Mucous membranes are moist.     Pharynx: Oropharynx is clear.  Eyes:     Extraocular Movements: Extraocular movements intact.     Pupils: Pupils are equal, round, and reactive to light.  Neck:     Vascular: No carotid bruit.  Cardiovascular:     Rate and Rhythm: Normal rate and regular rhythm.  Heart sounds: Normal heart sounds. No murmur heard. Pulmonary:     Effort: Pulmonary effort is normal. No respiratory distress.     Breath sounds: Normal breath sounds.  Musculoskeletal:     Cervical back: Neck supple. No rigidity.     Right lower leg: No edema.  Skin:    General: Skin is warm.  Neurological:     General: No focal deficit present.     Mental Status: She is alert and oriented to person, place, and time.  Psychiatric:        Mood and Affect: Mood normal.        Behavior: Behavior normal.        Thought Content: Thought content normal.        Judgment: Judgment normal.      No results found for any visits on 09/22/22.    The ASCVD Risk score (Arnett DK, et al., 2019) failed to calculate for the following reasons:   The patient has a prior MI or stroke diagnosis    Assessment & Plan:   Erika Ruiz was seen today for medical management of chronic issues, hypertension and hyperlipidemia.  Diagnoses and all orders for this visit:  Primary hypertension Well controlled on current regimen.  -     CMP14+EGFR  Mixed  hyperlipidemia Fasting panel pending. On zetia. ? Statin intolerance -     Lipid panel  Class 1 obesity due to excess calories with serious comorbidity and body mass index (BMI) of 31.0 to 31.9 in adult Diet and exercise.   History of stroke Has appt with neurology for follow up in a few days. Will discuss statin pending labs results.   Other fatigue Discussed likely due to poor sleep due to anxiety. Will work to control anxiety.  -     Vitamin B12 -     TSH  Anxiety Start buspar as below. Follow up in 6 weeks to recheck.  -     busPIRone (BUSPAR) 5 MG tablet; Take 1 tablet (5 mg total) by mouth 2 (two) times daily.  Vitamin D deficiency Not currently on supplement. Labs pending.  -     Vitamin D, 25-hydroxy  Need for hepatitis C screening test -     Hepatitis C antibody  Return in about 6 weeks (around 11/03/2022) for medication follow up.   The patient indicates understanding of these issues and agrees with the plan.  Gabriel Earing, FNP

## 2022-09-22 NOTE — Patient Instructions (Signed)
Managing Anxiety, Adult After being diagnosed with anxiety, you may be relieved to know why you have felt or behaved a certain way. You may also feel overwhelmed about the treatment ahead and what it will mean for your life. With care and support, you can manage your anxiety. How to manage lifestyle changes Understanding the difference between stress and anxiety Although stress can play a role in anxiety, it is not the same as anxiety. Stress is your body's reaction to life changes and events, both good and bad. Stress is often caused by something external, such as a deadline, test, or competition. It normally goes away after the event has ended and will last just a few hours. But, stress can be ongoing and can lead to more than just stress. Anxiety is caused by something internal, such as imagining a terrible outcome or worrying that something will go wrong that will greatly upset you. Anxiety often does not go away even after the event is over, and it can become a long-term (chronic) worry. Lowering stress and anxiety Talk with your health care provider or a counselor to learn more about lowering anxiety and stress. They may suggest tension-reduction techniques, such as: Music. Spend time creating or listening to music that you enjoy and that inspires you. Mindfulness-based meditation. Practice being aware of your normal breaths while not trying to control your breathing. It can be done while sitting or walking. Centering prayer. Focus on a word, phrase, or sacred image that means something to you and brings you peace. Deep breathing. Expand your stomach and inhale slowly through your nose. Hold your breath for 3-5 seconds. Then breathe out slowly, letting your stomach muscles relax. Self-talk. Learn to notice and spot thought patterns that lead to anxiety reactions. Change those patterns to thoughts that feel peaceful. Muscle relaxation. Take time to tense muscles and then relax them. Choose a  tension-reduction technique that fits your lifestyle and personality. These techniques take time and practice. Set aside 5-15 minutes a day to do them. Specialized therapists can offer counseling and training in these techniques. The training to help with anxiety may be covered by some insurance plans. Other things you can do to manage stress and anxiety include: Keeping a stress diary. This can help you learn what triggers your reaction and then learn ways to manage your response. Thinking about how you react to certain situations. You may not be able to control everything, but you can control your response. Making time for activities that help you relax and not feeling guilty about spending your time in this way. Doing visual imagery. This involves imagining or creating mental pictures to help you relax. Practicing yoga. Through yoga poses, you can lower tension and relax.  Medicines Medicines for anxiety include: Antidepressant medicines. These are usually prescribed for long-term daily control. Anti-anxiety medicines. These may be added in severe cases, especially when panic attacks occur. When used together, medicines, psychotherapy, and tension-reduction techniques may be the most effective treatment. Relationships Relationships can play a big part in helping you recover. Spend more time connecting with trusted friends and family members. Think about going to couples counseling if you have a partner, taking family education classes, or going to family therapy. Therapy can help you and others better understand your anxiety. How to recognize changes in your anxiety Everyone responds differently to treatment for anxiety. Recovery from anxiety happens when symptoms lessen and stop interfering with your daily life at home or work. This may mean that you   will start to: Have better concentration and focus. Worry will interfere less in your daily thinking. Sleep better. Be less irritable. Have more  energy. Have improved memory. Try to recognize when your condition is getting worse. Contact your provider if your symptoms interfere with home or work and you feel like your condition is not improving. Follow these instructions at home: Activity Exercise. Adults should: Exercise for at least 150 minutes each week. The exercise should increase your heart rate and make you sweat (moderate-intensity exercise). Do strengthening exercises at least twice a week. Get the right amount and quality of sleep. Most adults need 7-9 hours of sleep each night. Lifestyle  Eat a healthy diet that includes plenty of vegetables, fruits, whole grains, low-fat dairy products, and lean protein. Do not eat a lot of foods that are high in fats, added sugars, or salt (sodium). Make choices that simplify your life. Do not use any products that contain nicotine or tobacco. These products include cigarettes, chewing tobacco, and vaping devices, such as e-cigarettes. If you need help quitting, ask your provider. Avoid caffeine, alcohol, and certain over-the-counter cold medicines. These may make you feel worse. Ask your pharmacist which medicines to avoid. General instructions Take over-the-counter and prescription medicines only as told by your provider. Keep all follow-up visits. This is to make sure you are managing your anxiety well or if you need more support. Where to find support You can get help and support from: Self-help groups. Online and community organizations. A trusted spiritual leader. Couples counseling. Family education classes. Family therapy. Where to find more information You may find that joining a support group helps you deal with your anxiety. The following sources can help you find counselors or support groups near you: Mental Health America: mentalhealthamerica.net Anxiety and Depression Association of America (ADAA): adaa.org National Alliance on Mental Illness (NAMI): nami.org Contact  a health care provider if: You have a hard time staying focused or finishing tasks. You spend many hours a day feeling worried about everyday life. You are very tired because you cannot stop worrying. You start to have headaches or often feel tense. You have chronic nausea or diarrhea. Get help right away if: Your heart feels like it is racing. You have shortness of breath. You have thoughts of hurting yourself or others. Get help right away if you feel like you may hurt yourself or others, or have thoughts about taking your own life. Go to your nearest emergency room or: Call 911. Call the National Suicide Prevention Lifeline at 1-800-273-8255 or 988. This is open 24 hours a day. Text the Crisis Text Line at 741741. This information is not intended to replace advice given to you by your health care provider. Make sure you discuss any questions you have with your health care provider. Document Revised: 02/07/2022 Document Reviewed: 08/22/2020 Elsevier Patient Education  2023 [employer]  

## 2022-09-23 LAB — VITAMIN B12: Vitamin B-12: 276 pg/mL (ref 232–1245)

## 2022-09-23 LAB — VITAMIN D 25 HYDROXY (VIT D DEFICIENCY, FRACTURES): Vit D, 25-Hydroxy: 19.6 ng/mL — ABNORMAL LOW (ref 30.0–100.0)

## 2022-09-23 LAB — LIPID PANEL
Chol/HDL Ratio: 5.1 ratio — ABNORMAL HIGH (ref 0.0–4.4)
Cholesterol, Total: 189 mg/dL (ref 100–199)
HDL: 37 mg/dL — ABNORMAL LOW (ref >39)
LDL Chol Calc (NIH): 118 mg/dL — ABNORMAL HIGH (ref 0–99)
Triglycerides: 194 mg/dL — ABNORMAL HIGH (ref 0–149)
VLDL Cholesterol Cal: 34 mg/dL (ref 5–40)

## 2022-09-23 LAB — TSH: TSH: 2.49 u[IU]/mL (ref 0.450–4.500)

## 2022-09-23 LAB — HEPATITIS C ANTIBODY: Hep C Virus Ab: NONREACTIVE

## 2022-09-25 ENCOUNTER — Other Ambulatory Visit: Payer: Self-pay | Admitting: Family Medicine

## 2022-09-25 DIAGNOSIS — E559 Vitamin D deficiency, unspecified: Secondary | ICD-10-CM

## 2022-09-25 DIAGNOSIS — E782 Mixed hyperlipidemia: Secondary | ICD-10-CM

## 2022-09-25 MED ORDER — ROSUVASTATIN CALCIUM 20 MG PO TABS: 20.0000 mg | ORAL_TABLET | Freq: Every day | ORAL | 3 refills | Status: DC

## 2022-09-25 MED ORDER — VITAMIN D (ERGOCALCIFEROL) 1.25 MG (50000 UNIT) PO CAPS: 50000.0000 [IU] | ORAL_CAPSULE | ORAL | 0 refills | Status: DC

## 2022-09-26 ENCOUNTER — Encounter: Payer: Self-pay | Admitting: Diagnostic Neuroimaging

## 2022-09-26 ENCOUNTER — Ambulatory Visit: Payer: Medicaid Other | Admitting: Diagnostic Neuroimaging

## 2022-09-26 VITALS — Ht 66.0 in | Wt 192.0 lb

## 2022-09-26 DIAGNOSIS — I63412 Cerebral infarction due to embolism of left middle cerebral artery: Secondary | ICD-10-CM

## 2022-09-26 NOTE — Progress Notes (Signed)
[facility]  PATIENT: Erika Ruiz DOB: [birth year]  REFERRING CLINICIAN: Delynn Flavin M, DO HISTORY FROM: PATIENT AND HUSBAND REASON FOR VISIT: NEW CONSULT   HISTORICAL  CHIEF COMPLAINT:  Chief Complaint  Patient presents with   Dizziness    Rm 7 with spouse Darrel  Pt is well, spouse reports she mostly has dizziness with transitioning from sitting to standing. Symptoms have been post stroke 2 years.     HISTORY OF PRESENT ILLNESS:   57 year old female here for evaluation of dizziness and headache.  2022 patient had onset of aphasia and headache.  Patient presented to hospital for evaluation.  She was diagnosed with left temporal ischemic infarction.  Stroke workup was completed.  She was treated with medical management.  Symptoms have been stable since that time.  Continues to have intermittent headaches and dizziness.  07/21/2022 had increasing headaches and dizziness and hypertension went to the hospital for evaluation.  Followed up with PCP and then referred here for further evaluation.   REVIEW OF SYSTEMS: Full 14 system review of systems performed and negative with exception of: as per HPI.  ALLERGIES: Allergies  Allergen Reactions   Sulfa Antibiotics Rash    HOME MEDICATIONS: Outpatient Medications Prior to Visit  Medication Sig Dispense Refill   aspirin EC 81 MG EC tablet Take 1 tablet (81 mg total) by mouth daily. Swallow whole. X 90 days 30 tablet 11   busPIRone (BUSPAR) 5 MG tablet Take 1 tablet (5 mg total) by mouth 2 (two) times daily. 180 tablet 3   cetirizine (ZYRTEC) 10 MG chewable tablet Chew 10 mg by mouth daily.     ezetimibe (ZETIA) 10 MG tablet TAKE ONE (1) TABLET BY MOUTH EVERY DAY 90 tablet 0   rosuvastatin (CRESTOR) 20 MG tablet Take 1 tablet (20 mg total) by mouth daily. 90 tablet 3   telmisartan (MICARDIS) 80 MG tablet Take 1 tablet (80 mg total) by mouth daily. 90 tablet 0   Vitamin D, Ergocalciferol, (DRISDOL)  1.25 MG (50000 UNIT) CAPS capsule Take 1 capsule (50,000 Units total) by mouth every 7 (seven) days. 8 capsule 0   No facility-administered medications prior to visit.    PAST MEDICAL HISTORY: Past Medical History:  Diagnosis Date   Hyperlipemia    Hypertension    Uterine cancer (HCC) 1987   Vitamin D deficiency disease     PAST SURGICAL HISTORY: Past Surgical History:  Procedure Laterality Date   ABDOMINAL HYSTERECTOMY     due to CA   BIOPSY  03/13/2017   Procedure: BIOPSY;  Surgeon: West Bali, MD;  [facility];  Service: Endoscopy;;  gastric   BREAST EXCISIONAL BIOPSY Left    BREAST LUMPECTOMY Left    COLONOSCOPY WITH PROPOFOL N/A 03/13/2017   Procedure: COLONOSCOPY WITH PROPOFOL;  Surgeon: West Bali, MD;  [facility];  Service: Endoscopy;  Laterality: N/A;  11:00am   ESOPHAGOGASTRODUODENOSCOPY (EGD) WITH PROPOFOL N/A 03/13/2017   Procedure: ESOPHAGOGASTRODUODENOSCOPY (EGD) WITH PROPOFOL;  Surgeon: West Bali, MD;  [facility];  Service: Endoscopy;  Laterality: N/A;   LEFT OOPHORECTOMY     POLYPECTOMY  03/13/2017   Procedure: POLYPECTOMY;  Surgeon: West Bali, MD;  [facility];  Service: Endoscopy;;  colon   VAGINAL DELIVERY     X2    FAMILY HISTORY: Family History  Problem Relation Age of Onset   Ovarian cancer Paternal Aunt 57   Colon cancer Sister 41  Colon polyps Brother    Diabetes Father    Heart disease Father    Kidney disease Father    Breast cancer Mother     SOCIAL HISTORY: Social History   Socioeconomic History   Marital status: Married    Spouse name: Not on file   Number of children: 2   Years of education: Not on file   Highest education level: Not on file  Occupational History   Occupation: Cytogeneticist    Employer: [employer]  Tobacco Use   Smoking status: Never   Smokeless tobacco: Never  Vaping Use   Vaping Use: Never used  Substance and Sexual Activity    Alcohol use: No    Alcohol/week: 0.0 standard drinks of alcohol   Drug use: No   Sexual activity: Yes  Other Topics Concern   Not on file  Social History Narrative   Not on file   Social Determinants of Health   Financial Resource Strain: Not on file  Food Insecurity: Not on file  Transportation Needs: Not on file  Physical Activity: Not on file  Stress: Not on file  Social Connections: Not on file  Intimate Partner Violence: Not on file     PHYSICAL EXAM  GENERAL EXAM/CONSTITUTIONAL: Vitals:  Vitals:   09/26/22 1122  Weight: 192 lb (87.1 kg)  Height: 5\' 6"  (1.676 m)   Body mass index is 30.99 kg/m. Wt Readings from Last 3 Encounters:  09/26/22 192 lb (87.1 kg)  09/22/22 192 lb 6 oz (87.3 kg)  07/25/22 192 lb 9.6 oz (87.4 kg)  Orthostatic VS for the past 24 hrs (Last 3 readings):  BP- Lying Pulse- Lying BP- Standing at 0 minutes Pulse- Standing at 0 minutes BP- Standing at 3 minutes Pulse- Standing at 3 minutes  09/26/22 1123 (!) 190/97 79 (!) 204/100 77 (!) 192/98 75    Patient is in no distress; well developed, nourished and groomed; neck is supple  CARDIOVASCULAR: Examination of carotid arteries is normal; no carotid bruits Regular rate and rhythm, no murmurs Examination of peripheral vascular system by observation and palpation is normal  EYES: Ophthalmoscopic exam of optic discs and posterior segments is normal; no papilledema or hemorrhages No results found.  MUSCULOSKELETAL: Gait, strength, tone, movements noted in Neurologic exam below  NEUROLOGIC: MENTAL STATUS:      No data to display         awake, alert, oriented to person recent and remote memory intact normal attention and concentration DECR FLUENCY, comprehension intact fund of knowledge appropriate  CRANIAL NERVE:  2nd - no papilledema on fundoscopic exam 2nd, 3rd, 4th, 6th - pupils equal and reactive to light, visual fields full to confrontation, extraocular muscles intact, no  nystagmus 5th - facial sensation symmetric 7th - facial strength symmetric 8th - hearing intact 9th - palate elevates symmetrically, uvula midline 11th - shoulder shrug symmetric 12th - tongue protrusion midline  MOTOR:  normal bulk and tone, full strength in the BUE, BLE; except slight decr in left arm and leg  SENSORY:  normal and symmetric to light touch, temperature, vibration  COORDINATION:  finger-nose-finger, fine finger movements normal  REFLEXES:  deep tendon reflexes present and symmetric  GAIT/STATION:  narrow based gait     DIAGNOSTIC DATA (LABS, IMAGING, TESTING) - I reviewed patient records, labs, notes, testing and imaging myself where available.  Lab Results  Component Value Date   WBC 8.5 07/21/2022   HGB 15.2 (H) 07/21/2022   HCT 45.5 07/21/2022  MCV 90.8 07/21/2022   PLT 304 07/21/2022      Component Value Date/Time   NA 141 09/22/2022 0859   K 4.4 09/22/2022 0859   CL 102 09/22/2022 0859   CO2 22 09/22/2022 0859   GLUCOSE 103 (H) 09/22/2022 0859   GLUCOSE 119 (H) 07/21/2022 1913   BUN 12 09/22/2022 0859   CREATININE 0.82 09/22/2022 0859   CREATININE 0.87 02/14/2017 1539   CALCIUM 9.5 09/22/2022 0859   PROT 7.0 09/22/2022 0859   ALBUMIN 4.4 09/22/2022 0859   AST 26 09/22/2022 0859   ALT 39 (H) 09/22/2022 0859   ALKPHOS 124 (H) 09/22/2022 0859   BILITOT 0.5 09/22/2022 0859   GFRNONAA >60 07/21/2022 1913   GFRNONAA 78 02/14/2017 1539   GFRAA 90 02/14/2017 1539   Lab Results  Component Value Date   CHOL 189 09/22/2022   HDL 37 (L) 09/22/2022   LDLCALC 118 (H) 09/22/2022   TRIG 194 (H) 09/22/2022   CHOLHDL 5.1 (H) 09/22/2022   Lab Results  Component Value Date   HGBA1C 5.7 (H) 09/16/2020   Lab Results  Component Value Date   VITAMINB12 276 09/22/2022   Lab Results  Component Value Date   TSH 2.490 09/22/2022    09/16/20 MRI brain  1. Acute/subacute infarct the posterior left MCA territory, corresponding to hypodensity  seen on recent head CT. 2. 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 3. 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  09/16/20 MRA head Unchanged short segment occlusion with reconstitution or high-grade stenosis of proximal inferiorly directed branch of the left M1 MCA. No new vascular findings.  09/16/20 CTA head / neck 1. Short segment occlusion of the inferior division left MCA M2 segment. No other intracranial arterial occlusion or high-grade stenosis. 2. Normal CTA of the neck. 3. Normal CT perfusion study.  09/16/20 TTE  1. Left ventricular ejection fraction, by estimation, is 65 to 70%.     ASSESSMENT AND PLAN  57 y.o. year old female here with:   Dx:  1. Cerebrovascular accident (CVA) due to embolism of left middle cerebral artery (HCC)      PLAN:  LEFT MCA EMBOLIC STROKE, UNKNOWN SOURCE (2022) - refer for implanted loop recorder - check TCD bubble study - continue aspirin 81, zetia, crestor, telmisartan --> needs better BP control  HEADACHES / DIZZINESS --> could be due to hypertension, fatigue, anxiety issues - monitor; per PCP  Orders Placed This Encounter  Procedures   Ambulatory referral to Cardiac Electrophysiology   VAS [place] TRANSCRANIAL DOPPLER W BUBBLES   Return for pending if symptoms worsen or fail to improve, pending test results.    Suanne Marker, MD 09/26/2022, 12:00 PM Certified in Neurology, Neurophysiology and Neuroimaging  [facility] [address], [address] [place], [place] [address] [phone]

## 2022-09-26 NOTE — Patient Instructions (Addendum)
  LEFT MCA EMBOLIC STROKE, UNKNOWN SOURCE (2022) - refer for implanted loop recorder - check TCD bubble study - continue aspirin 81, zetia, crestor, telmisartan --> needs better BP control  HEADACHES / DIZZINESS --> could be due to hypertension, fatigue, anxiety issues - monitor; per PCP

## 2022-10-06 ENCOUNTER — Ambulatory Visit (HOSPITAL_COMMUNITY)
Admission: RE | Admit: 2022-10-06 | Discharge: 2022-10-06 | Disposition: A | Discharge status: Home / Self Care | Payer: Medicaid Other | Source: Ambulatory Visit | Attending: Diagnostic Neuroimaging | Admitting: Diagnostic Neuroimaging

## 2022-10-06 DIAGNOSIS — I63412 Cerebral infarction due to embolism of left middle cerebral artery: Secondary | ICD-10-CM | POA: Diagnosis not present

## 2022-10-06 NOTE — Progress Notes (Signed)
TCD with bubbles has been completed. Preliminary results can be found in CV Proc through chart review.   10/06/22 2:38 PM Olen Cordial RVT

## 2022-10-30 ENCOUNTER — Encounter: Payer: Self-pay | Admitting: Neurology

## 2022-10-31 ENCOUNTER — Ambulatory Visit: Payer: Medicaid Other | Admitting: Internal Medicine

## 2022-11-09 ENCOUNTER — Ambulatory Visit (INDEPENDENT_AMBULATORY_CARE_PROVIDER_SITE_OTHER): Payer: Medicaid Other | Admitting: Family Medicine

## 2022-11-09 VITALS — BP 133/78 | HR 88 | Temp 98.1°F | Resp 20 | Ht 66.0 in | Wt 194.0 lb

## 2022-11-09 DIAGNOSIS — I10 Essential (primary) hypertension: Secondary | ICD-10-CM | POA: Diagnosis not present

## 2022-11-09 DIAGNOSIS — I63412 Cerebral infarction due to embolism of left middle cerebral artery: Secondary | ICD-10-CM

## 2022-11-09 DIAGNOSIS — E782 Mixed hyperlipidemia: Secondary | ICD-10-CM | POA: Diagnosis not present

## 2022-11-09 DIAGNOSIS — E559 Vitamin D deficiency, unspecified: Secondary | ICD-10-CM

## 2022-11-09 DIAGNOSIS — R7303 Prediabetes: Secondary | ICD-10-CM | POA: Insufficient documentation

## 2022-11-09 DIAGNOSIS — F419 Anxiety disorder, unspecified: Secondary | ICD-10-CM

## 2022-11-09 MED ORDER — VITAMIN D (ERGOCALCIFEROL) 1.25 MG (50000 UNIT) PO CAPS: 50000.0000 [IU] | ORAL_CAPSULE | ORAL | 0 refills | Status: DC

## 2022-11-09 NOTE — Progress Notes (Signed)
Established Patient Office Visit  Subjective   Patient ID: Erika Ruiz, female    DOB: 1965-07-01  Age: 57 y.o. MRN: [id number]  Chief Complaint  Patient presents with   Follow-up    HPI  HTN Complaint with meds - Yes Current Medications - telmisartan Checking BP at home ranging 120s/60s Pertinent ROS:  Headache - No Fatigue - still feeling fatigued.  Visual Disturbances - No Chest pain - No Dyspnea - No Palpitations - No LE edema - No  2. HLD She stopped taking crestor due to muscle pain. She is still taking zetia.   3. Anxiety She is not taking buspar. She decided not to try it. Feels like symptoms are better now.      11/09/2022    2:14 PM 09/22/2022    8:26 AM 07/25/2022    2:03 PM  Depression screen PHQ 2/9  Decreased Interest 1 0 0  Down, Depressed, Hopeless 0 0 0  PHQ - 2 Score 1 0 0  Altered sleeping 0 0 0  Tired, decreased energy 3 3 3   Change in appetite 0 3 0  Feeling bad or failure about yourself  0 0 2  Trouble concentrating 2 2 1   Moving slowly or fidgety/restless 2 0 1  Suicidal thoughts 0 0 0  PHQ-9 Score 8 8 7   Difficult doing work/chores Somewhat difficult Not difficult at all Somewhat difficult      11/09/2022    2:15 PM 09/22/2022    8:26 AM 07/25/2022    2:03 PM 03/24/2022   11:33 AM  GAD 7 : Generalized Anxiety Score  Nervous, Anxious, on Edge 1 0 0 0  Control/stop worrying 0 3 1 0  Worry too much - different things 0 3 3 0  Trouble relaxing 0 3 0 0  Restless 0 0 0 0  Easily annoyed or irritable 2 3 3  0  Afraid - awful might happen 1 0 0 0  Total GAD 7 Score 4 12 7  0  Anxiety Difficulty Not difficult at all Not difficult at all Somewhat difficult Not difficult at all       Past Medical History:  Diagnosis Date   Hyperlipemia    Hypertension    Uterine cancer (HCC) 1987   Vitamin D deficiency disease       ROS As per HPI.   Objective:     BP 133/78   Pulse 88   Temp 98.1 F (36.7 C) (Temporal)   Resp 20    Ht 5\' 6"  (1.676 m)   Wt 194 lb (88 kg)   SpO2 97%   BMI 31.31 kg/m  BP Readings from Last 3 Encounters:  11/09/22 133/78  09/22/22 127/68  07/25/22 (!) 145/73    Physical Exam Vitals and nursing note reviewed.  Constitutional:      General: She is not in acute distress.    Appearance: She is obese. She is not ill-appearing, toxic-appearing or diaphoretic.  HENT:     Head: Normocephalic and atraumatic.     Mouth/Throat:     Mouth: Mucous membranes are moist.     Pharynx: Oropharynx is clear.  Eyes:     Extraocular Movements: Extraocular movements intact.     Pupils: Pupils are equal, round, and reactive to light.  Neck:     Vascular: No carotid bruit.  Cardiovascular:     Rate and Rhythm: Normal rate and regular rhythm.     Heart sounds: Normal heart sounds. No murmur heard.  Pulmonary:     Effort: Pulmonary effort is normal. No respiratory distress.     Breath sounds: Normal breath sounds.  Musculoskeletal:     Cervical back: Neck supple. No rigidity.     Right lower leg: No edema.  Skin:    General: Skin is warm.  Neurological:     General: No focal deficit present.     Mental Status: She is alert and oriented to person, place, and time.  Psychiatric:        Mood and Affect: Mood normal.        Behavior: Behavior normal.        Thought Content: Thought content normal.        Judgment: Judgment normal.      No results found for any visits on 11/09/22.    The ASCVD Risk score (Arnett DK, et al., 2019) failed to calculate for the following reasons:   The patient has a prior MI or stroke diagnosis    Assessment & Plan:   Erika Ruiz was seen today for follow-up.  Diagnoses and all orders for this visit:  Primary hypertension Discussed goal of <130/80. Discussed lifestyles management with diet, exercise, and weight loss.   Mixed hyperlipidemia She stopped crestor due to side effects. Does not wish to try another statin. Continue zetia. Diet, exercise, weight  loss. Recent LDL118, not at goal.   Vitamin D deficiency -     Vitamin D, Ergocalciferol, (DRISDOL) 1.25 MG (50000 UNIT) CAPS capsule; Take 1 capsule (50,000 Units total) by mouth every 7 (seven) days.  Anxiety Well controlled without medication.   Cerebrovascular accident (CVA) due to embolism of left middle cerebral artery (HCC) Discussed prevention. Continue aspirin. Declined statin.    Return in about 6 months (around 05/11/2023) for chronic follow up.   The patient indicates understanding of these issues and agrees with the plan.  Gabriel Earing, FNP

## 2022-11-09 NOTE — Patient Instructions (Signed)
I have sent in a weekly supplement for you to take for the next 8 weeks. After that, take an daily OTC vitamin D supplement with 1500-2000 IU a day.

## 2022-12-08 ENCOUNTER — Other Ambulatory Visit: Payer: Self-pay | Admitting: Family Medicine

## 2022-12-12 ENCOUNTER — Other Ambulatory Visit (HOSPITAL_COMMUNITY): Payer: Self-pay

## 2022-12-12 ENCOUNTER — Telehealth: Payer: Self-pay

## 2022-12-12 ENCOUNTER — Other Ambulatory Visit: Payer: Self-pay | Admitting: Family Medicine

## 2022-12-12 DIAGNOSIS — I10 Essential (primary) hypertension: Secondary | ICD-10-CM

## 2022-12-12 NOTE — Telephone Encounter (Signed)
Pharmacy Patient Advocate Encounter   Received notification from CoverMyMeds that prior authorization for Telmisartan 80MG  tablets is required/requested.   Insurance verification completed.   The patient is insured through [facility] .   Per test claim: PA required; PA submitted to [facility] via CoverMyMeds Key/confirmation #/EOC [id number] Status is pending

## 2022-12-13 NOTE — Telephone Encounter (Signed)
Pharmacy Patient Advocate Encounter  Received notification from [facility] that Prior Authorization for Telmisartan 80MG  tablets has been DENIED. Please advise how you'd like to proceed. Full denial letter will be uploaded to the media tab. See denial reason below.  PA #/Case ID/Reference #: [id number]  Denial reason: No documentation of failure or contraindication to two preferred drugs. Preferred drugs: Irbesartan, Losartan, Olmesartan, Valsartan

## 2022-12-14 MED ORDER — VALSARTAN 80 MG PO TABS: 80.0000 mg | ORAL_TABLET | Freq: Every day | ORAL | 3 refills | Status: DC

## 2022-12-14 NOTE — Addendum Note (Signed)
Addended by: Gabriel Earing on: 12/14/2022 07:53 AM   Modules accepted: Orders

## 2022-12-14 NOTE — Telephone Encounter (Signed)
Pt aware. Apt scheduled for BP check

## 2022-12-14 NOTE — Telephone Encounter (Signed)
lmtcb

## 2022-12-14 NOTE — Telephone Encounter (Signed)
Telmisartan not covered by insurance. I have sent in valsartan instead. Please have her come in for a BP check with triage in 2 weeks after change.

## 2022-12-28 ENCOUNTER — Ambulatory Visit: Payer: Medicaid Other

## 2023-01-03 ENCOUNTER — Encounter: Payer: Self-pay | Admitting: Orthopedic Surgery

## 2023-01-03 ENCOUNTER — Other Ambulatory Visit (INDEPENDENT_AMBULATORY_CARE_PROVIDER_SITE_OTHER): Payer: Medicaid Other

## 2023-01-03 ENCOUNTER — Ambulatory Visit: Payer: Medicaid Other | Admitting: Orthopedic Surgery

## 2023-01-03 VITALS — BP 168/100 | HR 74 | Ht 66.0 in | Wt 195.0 lb

## 2023-01-03 DIAGNOSIS — M151 Heberden's nodes (with arthropathy): Secondary | ICD-10-CM

## 2023-01-03 DIAGNOSIS — M79641 Pain in right hand: Secondary | ICD-10-CM

## 2023-01-03 MED ORDER — GABAPENTIN 100 MG PO CAPS: 100.0000 mg | ORAL_CAPSULE | Freq: Three times a day (TID) | ORAL | 0 refills | Status: DC

## 2023-01-03 NOTE — Progress Notes (Signed)
New Patient Visit  Assessment: Erika Ruiz is a 57 y.o. female with the following: 1. Pain in right hand   Plan: MARJO BIETZ has pain and burning in the right long finger.  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  She is taking Tylenol appropriately.  She does have a history of stroke, and is taking an aspirin, but I do think it would be okay for her to try ibuprofen occasionally.  Because she is having burning sensations, I recommended gabapentin.  We briefly discussed proceeding with surgery, which could include a DIP joint fusion.   We can also consider a referral to a hand specialist.  She states her understanding.  She will let me know if she has any further issues  Follow-up: Return if symptoms worsen or fail to improve.  Subjective:  Chief Complaint  Patient presents with   Hand Pain    Multiple fingers but R hand middle finger is the worse for 2 mos.     History of Present Illness: Erika Ruiz is a 57 y.o. female who presents for evaluation of hand pain.  She has progressively worsening pain in the long finger, as well as the small finger of the right hand.  She describes the pain as a burning type sensation.  No specific injury.  She does note that her mother has chronic deformities of both hands.  She is noticing some deformities in the long and small finger as well.  She has been taking Tylenol.  She has a history of stroke, and is currently on aspirin.  No bracing.  No injections.   Review of Systems: No fevers or chills No numbness or tingling No chest pain No shortness of breath No bowel or bladder dysfunction No GI distress No headaches   Medical History:  Past Medical History:  Diagnosis Date   Hyperlipemia    Hypertension    Uterine cancer (HCC) 1987   Vitamin D deficiency disease     Past Surgical History:  Procedure Laterality Date   ABDOMINAL HYSTERECTOMY      due to CA   BIOPSY  03/13/2017   Procedure: BIOPSY;  Surgeon: West Bali, MD;  [facility];  Service: Endoscopy;;  gastric   BREAST EXCISIONAL BIOPSY Left    BREAST LUMPECTOMY Left    COLONOSCOPY WITH PROPOFOL N/A 03/13/2017   Procedure: COLONOSCOPY WITH PROPOFOL;  Surgeon: West Bali, MD;  [facility];  Service: Endoscopy;  Laterality: N/A;  11:00am   ESOPHAGOGASTRODUODENOSCOPY (EGD) WITH PROPOFOL N/A 03/13/2017   Procedure: ESOPHAGOGASTRODUODENOSCOPY (EGD) WITH PROPOFOL;  Surgeon: West Bali, MD;  [facility];  Service: Endoscopy;  Laterality: N/A;   LEFT OOPHORECTOMY     POLYPECTOMY  03/13/2017   Procedure: POLYPECTOMY;  Surgeon: West Bali, MD;  [facility];  Service: Endoscopy;;  colon   VAGINAL DELIVERY     X2    Family History  Problem Relation Age of Onset   Ovarian cancer Paternal Aunt 48   Colon cancer Sister 26   Colon polyps Brother    Diabetes Father    Heart disease Father    Kidney disease Father    Breast cancer Mother    Social History   Tobacco Use   Smoking status: Never   Smokeless tobacco: Never  Vaping Use   Vaping status: Never Used  Substance Use Topics  Alcohol use: No    Alcohol/week: 0.0 standard drinks of alcohol   Drug use: No    Allergies  Allergen Reactions   Sulfa Antibiotics Rash    Current Meds  Medication Sig   gabapentin (NEURONTIN) 100 MG capsule Take 1 capsule (100 mg total) by mouth 3 (three) times daily.   telmisartan (MICARDIS) 80 MG tablet Take 80 mg by mouth daily.    Objective: BP (!) 168/100   Pulse 74   Ht 5\' 6"  (1.676 m)   Wt 195 lb (88.5 kg)   BMI 31.47 kg/m   Physical Exam:  General: Alert and oriented. and No acute distress. Gait: Normal gait.  Evaluation of the right hand demonstrates obvious deformity to the DIP joint of the long finger, as well as the small finger.  She does have some tenderness to palpation in this area.   Restricted flexion and extension at the DIP joint of the fingers.  Sensation intact to the distal aspect of the long finger.  Fingers warm well-perfused.  IMAGING: I personally ordered and reviewed the following images  X-rays of the right hand were obtained in clinic today.  No acute injuries are noted.  At the DIP joint of the long finger, there is complete loss of joint space.  There is some subchondral sclerosis.  There are associated osteophytes.  Similarly, on the small finger, DIP joint, there is loss of joint space and associated osteophytes.  No bony lesions.  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   New Medications:  Meds ordered this encounter  Medications   gabapentin (NEURONTIN) 100 MG capsule    Sig: Take 1 capsule (100 mg total) by mouth 3 (three) times daily.    Dispense:  90 capsule    Refill:  0      Oliver Barre, MD  01/03/2023 3:15 PM

## 2023-01-19 DIAGNOSIS — H5213 Myopia, bilateral: Secondary | ICD-10-CM | POA: Diagnosis not present

## 2023-02-21 ENCOUNTER — Ambulatory Visit (INDEPENDENT_AMBULATORY_CARE_PROVIDER_SITE_OTHER): Payer: Medicaid Other

## 2023-02-21 DIAGNOSIS — Z23 Encounter for immunization: Secondary | ICD-10-CM

## 2023-03-16 ENCOUNTER — Other Ambulatory Visit: Payer: Self-pay | Admitting: Family Medicine

## 2023-03-16 ENCOUNTER — Encounter: Payer: Self-pay | Admitting: General Practice

## 2023-03-16 DIAGNOSIS — Z1231 Encounter for screening mammogram for malignant neoplasm of breast: Secondary | ICD-10-CM

## 2023-04-23 ENCOUNTER — Ambulatory Visit
Admission: RE | Admit: 2023-04-23 | Discharge: 2023-04-23 | Disposition: A | Discharge status: Home / Self Care | Payer: No Typology Code available for payment source | Source: Ambulatory Visit | Attending: Family Medicine | Admitting: Family Medicine

## 2023-04-23 DIAGNOSIS — Z1231 Encounter for screening mammogram for malignant neoplasm of breast: Secondary | ICD-10-CM

## 2023-05-14 ENCOUNTER — Ambulatory Visit: Payer: No Typology Code available for payment source | Admitting: Family Medicine

## 2023-05-14 ENCOUNTER — Encounter: Payer: Self-pay | Admitting: Family Medicine

## 2023-05-14 VITALS — BP 128/70 | HR 88 | Temp 97.8°F | Ht 66.0 in | Wt 195.4 lb

## 2023-05-14 DIAGNOSIS — E559 Vitamin D deficiency, unspecified: Secondary | ICD-10-CM | POA: Diagnosis not present

## 2023-05-14 DIAGNOSIS — I10 Essential (primary) hypertension: Secondary | ICD-10-CM | POA: Diagnosis not present

## 2023-05-14 DIAGNOSIS — E538 Deficiency of other specified B group vitamins: Secondary | ICD-10-CM

## 2023-05-14 DIAGNOSIS — E6609 Other obesity due to excess calories: Secondary | ICD-10-CM

## 2023-05-14 DIAGNOSIS — I63412 Cerebral infarction due to embolism of left middle cerebral artery: Secondary | ICD-10-CM

## 2023-05-14 DIAGNOSIS — E66811 Obesity, class 1: Secondary | ICD-10-CM | POA: Diagnosis not present

## 2023-05-14 DIAGNOSIS — F419 Anxiety disorder, unspecified: Secondary | ICD-10-CM

## 2023-05-14 DIAGNOSIS — E782 Mixed hyperlipidemia: Secondary | ICD-10-CM | POA: Diagnosis not present

## 2023-05-14 DIAGNOSIS — R7303 Prediabetes: Secondary | ICD-10-CM | POA: Diagnosis not present

## 2023-05-14 DIAGNOSIS — Z6831 Body mass index (BMI) 31.0-31.9, adult: Secondary | ICD-10-CM | POA: Diagnosis not present

## 2023-05-14 LAB — CMP14+EGFR
ALT: 39 [IU]/L — ABNORMAL HIGH (ref 0–32)
AST: 25 [IU]/L (ref 0–40)
Albumin: 4.2 g/dL (ref 3.8–4.9)
Alkaline Phosphatase: 128 [IU]/L — ABNORMAL HIGH (ref 44–121)
BUN/Creatinine Ratio: 17 (ref 9–23)
BUN: 13 mg/dL (ref 6–24)
Bilirubin Total: 0.3 mg/dL (ref 0.0–1.2)
CO2: 23 mmol/L (ref 20–29)
Calcium: 9.2 mg/dL (ref 8.7–10.2)
Chloride: 104 mmol/L (ref 96–106)
Creatinine, Ser: 0.78 mg/dL (ref 0.57–1.00)
Globulin, Total: 2.5 g/dL (ref 1.5–4.5)
Glucose: 139 mg/dL — ABNORMAL HIGH (ref 70–99)
Potassium: 4.1 mmol/L (ref 3.5–5.2)
Sodium: 141 mmol/L (ref 134–144)
Total Protein: 6.7 g/dL (ref 6.0–8.5)
eGFR: 89 mL/min/{1.73_m2} (ref >59)

## 2023-05-14 LAB — LIPID PANEL
Chol/HDL Ratio: 4.8 {ratio} — ABNORMAL HIGH (ref 0.0–4.4)
Cholesterol, Total: 177 mg/dL (ref 100–199)
HDL: 37 mg/dL — ABNORMAL LOW (ref >39)
LDL Chol Calc (NIH): 110 mg/dL — ABNORMAL HIGH (ref 0–99)
Triglycerides: 171 mg/dL — ABNORMAL HIGH (ref 0–149)
VLDL Cholesterol Cal: 30 mg/dL (ref 5–40)

## 2023-05-14 LAB — BAYER DCA HB A1C WAIVED: HB A1C (BAYER DCA - WAIVED): 5.5 % (ref 4.8–5.6)

## 2023-05-14 MED ORDER — PRAVASTATIN SODIUM 80 MG PO TABS: 80.0000 mg | ORAL_TABLET | Freq: Every day | ORAL | 3 refills | Status: DC

## 2023-05-14 NOTE — Progress Notes (Signed)
Established Patient Office Visit  Subjective   Patient ID: Erika Ruiz, female    DOB: [birth year]  Age: 57 y.o. MRN: [id number]  Chief Complaint  Patient presents with   Medical Management of Chronic Issues    HPI  HTN Complaint with meds - Yes Current Medications - telmisartan Checking BP at home ranging 120s/60s Pertinent ROS:  Headache - No Fatigue - still feeling chronically fatigued since her stroke in 2022 Visual Disturbances - No Chest pain - No Dyspnea - No Palpitations - No LE edema - No  2. HLD She stopped taking crestor due to muscle pain. She is still taking zetia.   3. Hx of stroke Not taking statin due to myalgias. She is taking aspirin. Saw neurology earlier this year. She had a TCD bubble study that was norma. Neurology placed referral to cardiology for loop recorder, however patient has declined this.   4. Anxiety She has declined medications.      05/14/2023    9:03 AM 11/09/2022    2:14 PM 09/22/2022    8:26 AM  Depression screen PHQ 2/9  Decreased Interest 0 1 0  Down, Depressed, Hopeless 0 0 0  PHQ - 2 Score 0 1 0  Altered sleeping 0 0 0  Tired, decreased energy 2 3 3   Change in appetite 0 0 3  Feeling bad or failure about yourself  0 0 0  Trouble concentrating 0 2 2  Moving slowly or fidgety/restless 1 2 0  Suicidal thoughts 0 0 0  PHQ-9 Score 3 8 8   Difficult doing work/chores Not difficult at all Somewhat difficult Not difficult at all      05/14/2023    9:03 AM 11/09/2022    2:15 PM 09/22/2022    8:26 AM 07/25/2022    2:03 PM  GAD 7 : Generalized Anxiety Score  Nervous, Anxious, on Edge 0 1 0 0  Control/stop worrying 0 0 3 1  Worry too much - different things 0 0 3 3  Trouble relaxing 0 0 3 0  Restless 0 0 0 0  Easily annoyed or irritable 0 2 3 3   Afraid - awful might happen 0 1 0 0  Total GAD 7 Score 0 4 12 7   Anxiety Difficulty Not difficult at all Not difficult at all Not difficult at all Somewhat difficult        Past Medical History:  Diagnosis Date   Hyperlipemia    Hypertension    Uterine cancer (HCC) 1987   Vitamin D deficiency disease       ROS As per HPI.   Objective:     BP 128/70 Comment: at home reading per pt  Pulse 88   Temp 97.8 F (36.6 C) (Temporal)   Ht 5\' 6"  (1.676 m)   Wt 195 lb 6.4 oz (88.6 kg)   SpO2 97%   BMI 31.54 kg/m  BP Readings from Last 3 Encounters:  05/14/23 128/70  01/03/23 (!) 168/100  11/09/22 133/78    Physical Exam Vitals and nursing note reviewed.  Constitutional:      General: She is not in acute distress.    Appearance: She is obese. She is not ill-appearing, toxic-appearing or diaphoretic.  HENT:     Head: Normocephalic and atraumatic.     Mouth/Throat:     Mouth: Mucous membranes are moist.     Pharynx: Oropharynx is clear.  Eyes:     Extraocular Movements: Extraocular movements intact.  Pupils: Pupils are equal, round, and reactive to light.  Neck:     Vascular: No carotid bruit.  Cardiovascular:     Rate and Rhythm: Normal rate and regular rhythm.     Heart sounds: Normal heart sounds. No murmur heard. Pulmonary:     Effort: Pulmonary effort is normal. No respiratory distress.     Breath sounds: Normal breath sounds.  Musculoskeletal:     Cervical back: Neck supple. No rigidity.     Right lower leg: No edema.  Skin:    General: Skin is warm.  Neurological:     General: No focal deficit present.     Mental Status: She is alert and oriented to person, place, and time.  Psychiatric:        Mood and Affect: Mood normal.        Behavior: Behavior normal.        Thought Content: Thought content normal.        Judgment: Judgment normal.      No results found for any visits on 05/14/23.    The ASCVD Risk score (Arnett DK, et al., 2019) failed to calculate for the following reasons:   Risk score cannot be calculated because patient has a medical history suggesting prior/existing ASCVD    Assessment &  Plan:   Primary hypertension Assessment & Plan: Well controlled with telmisartan.    Mixed hyperlipidemia -     CMP14+EGFR -     Lipid panel -     Pravastatin Sodium; Take 1 tablet (80 mg total) by mouth daily.  Dispense: 90 tablet; Refill: 3  Prediabetes Assessment & Plan: A1c 5.5 today. Diet, exercise, weight loss.   Orders: -     Bayer DCA Hb A1c Waived  B12 deficiency Assessment & Plan: Last level was normal. Will recheck at next appt.    Vitamin D deficiency Assessment & Plan: Discussed OTC supplement.    Cerebrovascular accident (CVA) due to embolism of left middle cerebral artery [facility]) Assessment & Plan: Will try pravastatin. Had myalgias with atorvastatin and crestor. On aspirin. Established with neurology. Unknown etiology. Had normal TCD bubble study. Declined referral for loop recorder.    Class 1 obesity due to excess calories with serious comorbidity and body mass index (BMI) of 31.0 to 31.9 in adult Assessment & Plan: Diet, exercise, weight loss.    Anxiety Assessment & Plan: Declined treatment. Currently well controlled.       Return in about 6 months (around 11/12/2023) for CPE.   The patient indicates understanding of these issues and agrees with the plan.  Gabriel Earing, FNP

## 2023-05-14 NOTE — Assessment & Plan Note (Signed)
Last level was normal. Will recheck at next appt.

## 2023-05-14 NOTE — Assessment & Plan Note (Signed)
Discussed OTC supplement.

## 2023-05-14 NOTE — Assessment & Plan Note (Signed)
Will try pravastatin. Had myalgias with atorvastatin and crestor. On aspirin. Established with neurology. Unknown etiology. Had normal TCD bubble study. Declined referral for loop recorder.

## 2023-05-14 NOTE — Assessment & Plan Note (Signed)
A1c 5.5 today. Diet, exercise, weight loss.

## 2023-05-14 NOTE — Assessment & Plan Note (Signed)
Diet, exercise, weight loss

## 2023-05-14 NOTE — Assessment & Plan Note (Signed)
Well controlled with telmisartan.

## 2023-05-14 NOTE — Assessment & Plan Note (Signed)
Declined treatment. Currently well controlled.

## 2023-05-17 ENCOUNTER — Other Ambulatory Visit: Payer: Self-pay | Admitting: Family Medicine

## 2023-05-17 DIAGNOSIS — R7989 Other specified abnormal findings of blood chemistry: Secondary | ICD-10-CM

## 2023-05-23 DIAGNOSIS — J029 Acute pharyngitis, unspecified: Secondary | ICD-10-CM | POA: Diagnosis not present

## 2023-05-23 DIAGNOSIS — J069 Acute upper respiratory infection, unspecified: Secondary | ICD-10-CM | POA: Diagnosis not present

## 2023-05-25 ENCOUNTER — Ambulatory Visit (INDEPENDENT_AMBULATORY_CARE_PROVIDER_SITE_OTHER): Payer: No Typology Code available for payment source | Admitting: Family Medicine

## 2023-05-25 ENCOUNTER — Encounter: Payer: Self-pay | Admitting: Family Medicine

## 2023-05-25 VITALS — BP 167/90 | HR 84 | Temp 98.2°F | Ht 66.0 in | Wt 195.0 lb

## 2023-05-25 DIAGNOSIS — J40 Bronchitis, not specified as acute or chronic: Secondary | ICD-10-CM | POA: Diagnosis not present

## 2023-05-25 MED ORDER — AZITHROMYCIN 250 MG PO TABS: ORAL_TABLET | ORAL | 0 refills | Status: DC

## 2023-05-25 MED ORDER — PREDNISONE 10 MG (21) PO TBPK: ORAL_TABLET | ORAL | 0 refills | Status: DC

## 2023-05-25 NOTE — Progress Notes (Signed)
 Acute Office Visit  Subjective:     Patient ID: Erika Ruiz, female    DOB: 05-10-1966, 58 y.o.   MRN: [id number]  Chief Complaint  Patient presents with   Cough    Cough This is a new problem. Episode onset: 5-6 days. The problem has been unchanged. The problem occurs every few minutes. The cough is Productive of sputum (yellow). Associated symptoms include ear pain, headaches (sinus pressure), nasal congestion, a sore throat and shortness of breath (with coughing fits). Pertinent negatives include no chest pain, chills, fever, myalgias or wheezing. She has tried OTC cough suppressant (decongestants, tessalon perles) for the symptoms. The treatment provided no relief. Her past medical history is significant for bronchitis. There is no history of asthma, bronchiectasis or pneumonia.   Seen at [facility] on 05/22/22. Negative flu, Covid, RSV, strep.   Review of Systems  Constitutional:  Negative for chills and fever.  HENT:  Positive for ear pain and sore throat.   Respiratory:  Positive for cough and shortness of breath (with coughing fits). Negative for wheezing.   Cardiovascular:  Negative for chest pain.  Musculoskeletal:  Negative for myalgias.  Neurological:  Positive for headaches (sinus pressure).        Objective:    BP (!) 167/90   Pulse 84   Temp 98.2 F (36.8 C) (Temporal)   Ht 5' 6 (1.676 m)   Wt 195 lb (88.5 kg)   SpO2 96%   BMI 31.47 kg/m  BP Readings from Last 3 Encounters:  05/25/23 (!) 167/90  05/14/23 128/70  01/03/23 (!) 168/100      Physical Exam Vitals and nursing note reviewed.  Constitutional:      General: She is not in acute distress.    Appearance: She is not ill-appearing, toxic-appearing or diaphoretic.  HENT:     Right Ear: Ear canal and external ear normal. A middle ear effusion is present. Tympanic membrane is not scarred, perforated, erythematous or retracted.     Left Ear: Ear canal and external ear normal. A middle ear effusion is  present. Tympanic membrane is not scarred, perforated, erythematous or retracted.     Nose: Congestion present.     Mouth/Throat:     Mouth: Mucous membranes are moist.     Pharynx: Oropharynx is clear. No oropharyngeal exudate or posterior oropharyngeal erythema.  Eyes:     General:        Right eye: No discharge.        Left eye: No discharge.     Conjunctiva/sclera: Conjunctivae normal.  Cardiovascular:     Rate and Rhythm: Normal rate and regular rhythm.     Heart sounds: No murmur heard. Pulmonary:     Effort: Pulmonary effort is normal. No respiratory distress.     Breath sounds: Normal breath sounds. No wheezing, rhonchi or rales.  Abdominal:     General: Bowel sounds are normal. There is no distension.     Palpations: Abdomen is soft.     Tenderness: There is no abdominal tenderness. There is no guarding or rebound.  Musculoskeletal:     Cervical back: Neck supple. No rigidity.     Right lower leg: No edema.     Left lower leg: No edema.  Skin:    General: Skin is warm and dry.  Neurological:     General: No focal deficit present.     Mental Status: She is alert and oriented to person, place, and time.  Psychiatric:  Mood and Affect: Mood normal.        Behavior: Behavior normal.     No results found for any visits on 05/25/23.      Assessment & Plan:   Erika Ruiz was seen today for cough.  Diagnoses and all orders for this visit:  Bronchitis Start prednisone  taper as below. Discussed likely viral etiology but can start zpak if symtpoms worsen or do not improve after an additional 2 days to cover for bacterial etiology. Discussed symptomatic care and return precautions.  -     azithromycin  (ZITHROMAX  Z-PAK) 250 MG tablet; As directed -     predniSONE  (STERAPRED UNI-PAK 21 TAB) 10 MG (21) TBPK tablet; Use as directed on back of pill pack  HTN BP elevated today. She did have decongestants today. Monitor BP at home. Discussed to avoid decongestants and to  notify if BP remains elevated after completing prednisone .   The patient indicates understanding of these issues and agrees with the plan.  Erika CHRISTELLA Search, FNP

## 2023-05-29 ENCOUNTER — Ambulatory Visit (HOSPITAL_COMMUNITY): Payer: No Typology Code available for payment source

## 2023-06-01 ENCOUNTER — Ambulatory Visit (HOSPITAL_COMMUNITY)
Admission: RE | Admit: 2023-06-01 | Discharge: 2023-06-01 | Disposition: A | Discharge status: Home / Self Care | Payer: No Typology Code available for payment source | Source: Ambulatory Visit | Attending: Family Medicine

## 2023-06-01 ENCOUNTER — Other Ambulatory Visit: Payer: Self-pay | Admitting: Family Medicine

## 2023-06-01 DIAGNOSIS — R7989 Other specified abnormal findings of blood chemistry: Secondary | ICD-10-CM | POA: Diagnosis not present

## 2023-06-01 DIAGNOSIS — K760 Fatty (change of) liver, not elsewhere classified: Secondary | ICD-10-CM | POA: Diagnosis not present

## 2023-06-11 DIAGNOSIS — H52223 Regular astigmatism, bilateral: Secondary | ICD-10-CM | POA: Diagnosis not present

## 2023-06-11 DIAGNOSIS — H5203 Hypermetropia, bilateral: Secondary | ICD-10-CM | POA: Diagnosis not present

## 2023-06-11 DIAGNOSIS — D23112 Other benign neoplasm of skin of right lower eyelid, including canthus: Secondary | ICD-10-CM | POA: Diagnosis not present

## 2023-06-11 DIAGNOSIS — H524 Presbyopia: Secondary | ICD-10-CM | POA: Diagnosis not present

## 2023-06-18 ENCOUNTER — Other Ambulatory Visit: Payer: Self-pay | Admitting: *Deleted

## 2023-06-18 MED ORDER — TELMISARTAN 80 MG PO TABS: 80.0000 mg | ORAL_TABLET | Freq: Every day | ORAL | 1 refills | Status: DC

## 2023-06-25 ENCOUNTER — Other Ambulatory Visit: Payer: Self-pay | Admitting: Family Medicine

## 2023-06-25 MED ORDER — TELMISARTAN 80 MG PO TABS: 80.0000 mg | ORAL_TABLET | Freq: Every day | ORAL | 1 refills | Status: DC

## 2023-06-25 NOTE — Telephone Encounter (Signed)
 Copied from CRM [id number]. Topic: Clinical - Medication Refill >> Jun 25, 2023 11:48 AM Antwanette L wrote: Most Recent Primary Care Visit:  Provider: Albertha Huger  Department[facility]  Visit Type: ACUTE  Date: 05/25/2023  Medication: telmisartan  (MICARDIS ) 80 MG tablet  Has the patient contacted their pharmacy? No (Agent: If no, request that the patient contact the pharmacy for the refill. If patient does not wish to contact the pharmacy document the reason why and proceed with request.) (Agent: If yes, when and what did the pharmacy advise?)  Is this the correct pharmacy for this prescription? Yes If no, delete pharmacy and type the correct one.  This is the patient's preferred pharmacy:  [facility] [place] - [address] [place] [address] Phone: [phone] Fax: [phone]   Has the prescription been filled recently? No  Is the patient out of the medication? No  Has the patient been seen for an appointment in the last year OR does the patient have an upcoming appointment? Yes  Can we respond through MyChart? No  Agent: Please be advised that Rx refills may take up to 3 business days. We ask that you follow-up with your pharmacy.

## 2023-07-09 ENCOUNTER — Encounter: Payer: Self-pay | Admitting: *Deleted

## 2023-08-20 DIAGNOSIS — I209 Angina pectoris, unspecified: Secondary | ICD-10-CM | POA: Diagnosis not present

## 2023-08-20 DIAGNOSIS — Z008 Encounter for other general examination: Secondary | ICD-10-CM | POA: Diagnosis not present

## 2023-08-20 DIAGNOSIS — E785 Hyperlipidemia, unspecified: Secondary | ICD-10-CM | POA: Diagnosis not present

## 2023-08-20 DIAGNOSIS — Z6830 Body mass index (BMI) 30.0-30.9, adult: Secondary | ICD-10-CM | POA: Diagnosis not present

## 2023-08-20 DIAGNOSIS — I10 Essential (primary) hypertension: Secondary | ICD-10-CM | POA: Diagnosis not present

## 2023-08-20 DIAGNOSIS — R2681 Unsteadiness on feet: Secondary | ICD-10-CM | POA: Diagnosis not present

## 2023-08-20 DIAGNOSIS — I6930 Unspecified sequelae of cerebral infarction: Secondary | ICD-10-CM | POA: Diagnosis not present

## 2023-08-20 DIAGNOSIS — E669 Obesity, unspecified: Secondary | ICD-10-CM | POA: Diagnosis not present

## 2023-08-22 ENCOUNTER — Telehealth: Payer: Self-pay

## 2023-08-22 NOTE — Telephone Encounter (Signed)
 I reviewed note. Looks like patient has been struggling with aphagia and this is why the speech therapy was recommended. Does she have preference for location?

## 2023-08-22 NOTE — Telephone Encounter (Unsigned)
 Copied from CRM [id number]. Topic: General - Other >> Aug 22, 2023  2:13 PM Eunice Blase wrote: Reason for CRM: Pt returning call stated does not have any preference for location of therapy for Aphagia.

## 2023-08-22 NOTE — Telephone Encounter (Signed)
 Copied from CRM [id number]. Topic: Clinical - Lab/Test Results >> Aug 22, 2023 10:26 AM Fonda Kinder J wrote: Reason for CRM: [facility] called in to notify the pts pcp that the pt seen a NP yesterday and the pt had external angina and wanted to know if her pcp would send a referral order for speech therapy Phone # [phone] Fax # [phone]

## 2023-08-23 ENCOUNTER — Ambulatory Visit: Admitting: Family Medicine

## 2023-08-23 VITALS — BP 122/72 | HR 80 | Temp 97.7°F | Wt 196.2 lb

## 2023-08-23 DIAGNOSIS — E782 Mixed hyperlipidemia: Secondary | ICD-10-CM

## 2023-08-23 DIAGNOSIS — R079 Chest pain, unspecified: Secondary | ICD-10-CM | POA: Diagnosis not present

## 2023-08-23 DIAGNOSIS — R4701 Aphasia: Secondary | ICD-10-CM

## 2023-08-23 DIAGNOSIS — I10 Essential (primary) hypertension: Secondary | ICD-10-CM | POA: Diagnosis not present

## 2023-08-23 DIAGNOSIS — I6930 Unspecified sequelae of cerebral infarction: Secondary | ICD-10-CM

## 2023-08-23 DIAGNOSIS — I63412 Cerebral infarction due to embolism of left middle cerebral artery: Secondary | ICD-10-CM

## 2023-08-23 LAB — LIPID PANEL

## 2023-08-23 MED ORDER — PRAVASTATIN SODIUM 80 MG PO TABS: 40.0000 mg | ORAL_TABLET | Freq: Every day | ORAL | 3 refills | Status: DC

## 2023-08-23 MED ORDER — PRAVASTATIN SODIUM 40 MG PO TABS: 40.0000 mg | ORAL_TABLET | Freq: Every day | ORAL | 3 refills | Status: DC

## 2023-08-23 NOTE — Telephone Encounter (Signed)
 Pt was seen in office today with Tiffany and this was discussed and pt is not interested.

## 2023-08-23 NOTE — Progress Notes (Signed)
 Established Patient Office Visit  Subjective   Patient ID: Erika Ruiz, female    DOB: [birth year]  Age: 58 y.o. MRN: [id number]  Chief Complaint  Patient presents with   Medical Management of Chronic Issues    HPI Veda is here for follow up after having an NP from her insurance come out for a visit. [facility] recommend a referral to SLP for aphasia. This is a result of her CVA. She did SLP for 6 months after her stroke. Struggles with finding or struggling to say to the right word some times. Often she writes down what she wants to say instead. She did not have any improvement with SLP and does not wish to do this again.   She is interested in a referral to cardiology however. She has intermittent sharp pain in the left side of her chest. Pain occurs about 1x a month. Only occurs with activity. Pain last for a minutes and resolves with rest. She has been limiting her activity because of the pain. Always feels fatigued since her CVA. Reports intermittent mild peripheral edema. Denies dyspnea. Neurology had previously referred her to cardiology for a possible loop recorder. She declined at that time. She is now interested in a cardiology referral because of the chest pain.   She has been taking statin a few times a week due to myalgias. Would like to try a lower dosage to see if she can manage taking it daily.      ROS As per HPI.    Objective:     BP 122/72 Comment: at home reading per pt  Pulse 80   Temp 97.7 F (36.5 C) (Temporal)   Wt 196 lb 3.2 oz (89 kg)   SpO2 95%   BMI 31.67 kg/m    Physical Exam Vitals and nursing note reviewed.  Constitutional:      General: She is not in acute distress.    Appearance: She is not ill-appearing, toxic-appearing or diaphoretic.  HENT:     Mouth/Throat:     Mouth: Mucous membranes are moist.     Pharynx: Oropharynx is clear.  Eyes:     General: No scleral icterus.    Extraocular Movements: Extraocular movements intact.      Pupils: Pupils are equal, round, and reactive to light.  Cardiovascular:     Rate and Rhythm: Normal rate and regular rhythm.     Heart sounds: Normal heart sounds. No murmur heard. Pulmonary:     Effort: Pulmonary effort is normal. No respiratory distress.     Breath sounds: Normal breath sounds. No wheezing, rhonchi or rales.  Musculoskeletal:     Cervical back: Neck supple. No rigidity.     Right lower leg: No edema.     Left lower leg: No edema.  Skin:    General: Skin is warm and dry.  Neurological:     Mental Status: She is alert and oriented to person, place, and time. Mental status is at baseline.     Motor: No weakness.     Gait: Gait normal.  Psychiatric:        Mood and Affect: Mood normal.        Behavior: Behavior normal.      No results found for any visits on 08/23/23.    The ASCVD Risk score (Arnett DK, et al., 2019) failed to calculate for the following reasons:   Risk score cannot be calculated because patient has a medical history suggesting prior/existing ASCVD  Assessment & Plan:   Shadow was seen today for medical management of chronic issues.  Diagnoses and all orders for this visit:  Cerebrovascular accident (CVA) due to embolism of left middle cerebral artery (HCC) -     Ambulatory referral to Cardiology  Chest pain on exertion EKG sinus rhythm. No significant changes from previous. Pain occurs about 1x a month. Referral to cardiology discussed at placed. Discussed when to seek emergency care.  -     EKG 12-Lead -     Ambulatory referral to Cardiology  Primary hypertension BP at goal with home readings.  -     CBC with Differential/Platelet -     CMP14+EGFR -     Ambulatory referral to Cardiology  White coat syndrome with hypertension  Mixed hyperlipidemia Will try lower dose to she if she can tolerate this daily as she is currently taking 80 mg dosage 2-3x a week at most. Fasting panel pending. Discussed importance of control with hx  of CVA.  -     pravastatin (PRAVACHOL) 40 MG tablet; Take 1 tablet (40 mg total) by mouth daily. -     CMP14+EGFR -     Lipid panel  Aphasia Affect of CVA. Has completed SLP in past and declines referral today.   Keep scheduled follow up, sooner for new or worsening symptoms.   The patient indicates understanding of these issues and agrees with the plan   Albertha Huger, FNP

## 2023-08-23 NOTE — Telephone Encounter (Signed)
 Addressed at visit

## 2023-08-24 ENCOUNTER — Encounter: Payer: Self-pay | Admitting: Family Medicine

## 2023-08-24 LAB — CBC WITH DIFFERENTIAL/PLATELET
Basophils Absolute: 0.1 10*3/uL (ref 0.0–0.2)
Basos: 1 %
EOS (ABSOLUTE): 0.1 10*3/uL (ref 0.0–0.4)
Eos: 2 %
Hematocrit: 45.2 % (ref 34.0–46.6)
Hemoglobin: 14.8 g/dL (ref 11.1–15.9)
Immature Grans (Abs): 0 10*3/uL (ref 0.0–0.1)
Immature Granulocytes: 0 %
Lymphocytes Absolute: 2 10*3/uL (ref 0.7–3.1)
Lymphs: 34 %
MCH: 30 pg (ref 26.6–33.0)
MCHC: 32.7 g/dL (ref 31.5–35.7)
MCV: 92 fL (ref 79–97)
Monocytes Absolute: 0.5 10*3/uL (ref 0.1–0.9)
Monocytes: 9 %
Neutrophils Absolute: 3.2 10*3/uL (ref 1.4–7.0)
Neutrophils: 54 %
Platelets: 317 10*3/uL (ref 150–450)
RBC: 4.94 x10E6/uL (ref 3.77–5.28)
RDW: 13.4 % (ref 11.7–15.4)
WBC: 5.9 10*3/uL (ref 3.4–10.8)

## 2023-08-24 LAB — LIPID PANEL
Cholesterol, Total: 212 mg/dL — ABNORMAL HIGH (ref 100–199)
HDL: 39 mg/dL — ABNORMAL LOW (ref >39)
LDL CALC COMMENT:: 5.4 ratio — ABNORMAL HIGH (ref 0.0–4.4)
LDL Chol Calc (NIH): 138 mg/dL — ABNORMAL HIGH (ref 0–99)
Triglycerides: 194 mg/dL — ABNORMAL HIGH (ref 0–149)
VLDL Cholesterol Cal: 35 mg/dL (ref 5–40)

## 2023-08-24 LAB — CMP14+EGFR
ALT: 39 IU/L — ABNORMAL HIGH (ref 0–32)
AST: 28 IU/L (ref 0–40)
Albumin: 4.6 g/dL (ref 3.8–4.9)
Alkaline Phosphatase: 131 IU/L — ABNORMAL HIGH (ref 44–121)
BUN/Creatinine Ratio: 12 (ref 9–23)
BUN: 9 mg/dL (ref 6–24)
Bilirubin Total: 0.5 mg/dL (ref 0.0–1.2)
CO2: 24 mmol/L (ref 20–29)
Calcium: 9.7 mg/dL (ref 8.7–10.2)
Chloride: 102 mmol/L (ref 96–106)
Creatinine, Ser: 0.77 mg/dL (ref 0.57–1.00)
Globulin, Total: 2.8 g/dL (ref 1.5–4.5)
Glucose: 96 mg/dL (ref 70–99)
Potassium: 4.5 mmol/L (ref 3.5–5.2)
Sodium: 141 mmol/L (ref 134–144)
Total Protein: 7.4 g/dL (ref 6.0–8.5)
eGFR: 90 mL/min/{1.73_m2} (ref >59)

## 2023-08-27 ENCOUNTER — Encounter: Payer: Self-pay | Admitting: Family Medicine

## 2023-09-13 ENCOUNTER — Other Ambulatory Visit: Payer: Self-pay | Admitting: Family Medicine

## 2023-10-05 ENCOUNTER — Ambulatory Visit: Admitting: Internal Medicine

## 2023-10-15 ENCOUNTER — Telehealth: Payer: Self-pay

## 2023-10-15 NOTE — Telephone Encounter (Signed)
 PCP is aware of compliance with pravastatin  as pt has side effects and is hesitant to take. We are trying lower doses as well as three times weekly.

## 2023-10-15 NOTE — Telephone Encounter (Signed)
 Copied from CRM [id number]. Topic: Clinical - Prescription Issue >> Oct 15, 2023 11:54 AM Everette C wrote: Reason for CRM: Luis with [employer] has called to share that they have been unable to reach the patient to discuss medication maintenance and compliance regarding their prescription for pravastatin  (PRAVACHOL ) 40 MG tablet [[id number]]   Please contact Luis at [phone] ext [phone] when possible to discuss more

## 2023-10-17 ENCOUNTER — Encounter: Payer: Self-pay | Admitting: Pharmacist

## 2023-10-17 NOTE — Progress Notes (Signed)
   10/17/23 Medication Adherence Medicare   This patient is appearing on a report for being at risk of failing the adherence measure for cholesterol (statin) and hypertension (ACEi/ARB) medications this calendar year.   Medications:  Pravastatin --> Last fill date: 05/17/23 for 90 day supply Telmisartan  -->Last fill date: 05/17/23 for 90 day supply   Will collaborate with provider to facilitate refill needs for telmisartan . 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  PCP f/u appt in 12/2023  Marvell Slider, PharmD, BCACP, CPP Clinical Pharmacist, [facility]

## 2023-11-14 ENCOUNTER — Ambulatory Visit: Admitting: Internal Medicine

## 2023-11-19 ENCOUNTER — Ambulatory Visit: Payer: Self-pay

## 2023-11-19 DIAGNOSIS — R079 Chest pain, unspecified: Secondary | ICD-10-CM | POA: Diagnosis not present

## 2023-11-19 DIAGNOSIS — R42 Dizziness and giddiness: Secondary | ICD-10-CM | POA: Diagnosis not present

## 2023-11-19 NOTE — Telephone Encounter (Signed)
 TO ER     Copied from CRM [id number]. Topic: Clinical - Red Word Triage >> Nov 19, 2023  1:19 PM Powell HERO wrote: Red Word that prompted transfer to Nurse Triage: Patient feeling extremely dizzy, disoriented,  and nauseated. Just started today. Hx of stroke 3 years ago and concerned about having another. She said if she even looks around she gets really dizzy and feels like she's going to pass out. Reason for Disposition  SEVERE dizziness (e.g., unable to stand, requires support to walk, feels like passing out now)  Answer Assessment - Initial Assessment Questions 1. DESCRIPTION: Describe your dizziness.     Nauseous, dizzy, no vomiting,  2. LIGHTHEADED: Do you feel lightheaded? (e.g., somewhat faint, woozy, weak upon standing)     Weak upon standing 3. VERTIGO: Do you feel like either you or the room is spinning or tilting? (i.e. vertigo)     yes 4. SEVERITY: How bad is it?  Do you feel like you are going to faint? Can you stand and walk?   - MILD: Feels slightly dizzy, but walking normally.   - MODERATE: Feels unsteady when walking, but not falling; interferes with normal activities (e.g., school, work).   - SEVERE: Unable to walk without falling, or requires assistance to walk without falling; feels like passing out now.      severe 5. ONSET:  When did the dizziness begin?     2 hours ago 6. AGGRAVATING FACTORS: Does anything make it worse? (e.g., standing, change in head position)     Standing and turning heart 7. HEART RATE: Can you tell me your heart rate? How many beats in 15 seconds?  (Note: not all patients can do this)       no 8. CAUSE: What do you think is causing the dizziness?     unknown 9. RECURRENT SYMPTOM: Have you had dizziness before? If Yes, ask: When was the last time? What happened that time?     Yes, when she had a stroke 10. OTHER SYMPTOMS: Do you have any other symptoms? (e.g., fever, chest pain, vomiting, diarrhea, bleeding)        no 11. PREGNANCY: Is there any chance you are pregnant? When was your last menstrual period?       no  Protocols used: Dizziness - Lightheadedness-A-AH

## 2023-11-20 ENCOUNTER — Encounter (HOSPITAL_COMMUNITY): Payer: Self-pay

## 2023-11-20 ENCOUNTER — Other Ambulatory Visit: Payer: Self-pay

## 2023-11-20 ENCOUNTER — Emergency Department (HOSPITAL_COMMUNITY)
Admission: EM | Admit: 2023-11-20 | Discharge: 2023-11-20 | Disposition: A | Discharge status: Home / Self Care | Attending: Emergency Medicine | Admitting: Emergency Medicine

## 2023-11-20 ENCOUNTER — Emergency Department (HOSPITAL_COMMUNITY)

## 2023-11-20 DIAGNOSIS — R112 Nausea with vomiting, unspecified: Secondary | ICD-10-CM | POA: Diagnosis not present

## 2023-11-20 DIAGNOSIS — Z7982 Long term (current) use of aspirin: Secondary | ICD-10-CM | POA: Diagnosis not present

## 2023-11-20 DIAGNOSIS — R42 Dizziness and giddiness: Secondary | ICD-10-CM | POA: Insufficient documentation

## 2023-11-20 DIAGNOSIS — R55 Syncope and collapse: Secondary | ICD-10-CM | POA: Diagnosis not present

## 2023-11-20 DIAGNOSIS — G9389 Other specified disorders of brain: Secondary | ICD-10-CM | POA: Diagnosis not present

## 2023-11-20 LAB — COMPREHENSIVE METABOLIC PANEL WITH GFR
ALT: 43 U/L (ref 0–44)
AST: 30 U/L (ref 15–41)
Albumin: 4.8 g/dL (ref 3.5–5.0)
Alkaline Phosphatase: 115 U/L (ref 38–126)
Anion gap: 9 (ref 5–15)
BUN: 15 mg/dL (ref 6–20)
CO2: 26 mmol/L (ref 22–32)
Calcium: 9.5 mg/dL (ref 8.9–10.3)
Chloride: 106 mmol/L (ref 98–111)
Creatinine, Ser: 0.85 mg/dL (ref 0.44–1.00)
GFR, Estimated: >60 mL/min (ref >60)
Glucose, Bld: 115 mg/dL — ABNORMAL HIGH (ref 70–99)
Potassium: 3.5 mmol/L (ref 3.5–5.1)
Sodium: 141 mmol/L (ref 135–145)
Total Bilirubin: 0.8 mg/dL (ref 0.0–1.2)
Total Protein: 8.8 g/dL — ABNORMAL HIGH (ref 6.5–8.1)

## 2023-11-20 LAB — CBC WITH DIFFERENTIAL/PLATELET
Abs Immature Granulocytes: 0.02 K/uL (ref 0.00–0.07)
Basophils Absolute: 0.1 K/uL (ref 0.0–0.1)
Basophils Relative: 1 %
Eosinophils Absolute: 0.1 K/uL (ref 0.0–0.5)
Eosinophils Relative: 2 %
HCT: 44 % (ref 36.0–46.0)
Hemoglobin: 15.2 g/dL — ABNORMAL HIGH (ref 12.0–15.0)
Immature Granulocytes: 0 %
Lymphocytes Relative: 36 %
Lymphs Abs: 2.2 K/uL (ref 0.7–4.0)
MCH: 31.1 pg (ref 26.0–34.0)
MCHC: 34.5 g/dL (ref 30.0–36.0)
MCV: 90.2 fL (ref 80.0–100.0)
Monocytes Absolute: 0.5 K/uL (ref 0.1–1.0)
Monocytes Relative: 8 %
Neutro Abs: 3.2 K/uL (ref 1.7–7.7)
Neutrophils Relative %: 53 %
Platelets: 309 K/uL (ref 150–400)
RBC: 4.88 MIL/uL (ref 3.87–5.11)
RDW: 13.2 % (ref 11.5–15.5)
WBC: 6.1 K/uL (ref 4.0–10.5)
nRBC: 0 % (ref 0.0–0.2)

## 2023-11-20 LAB — TROPONIN I (HIGH SENSITIVITY): Troponin I (High Sensitivity): 3 ng/L (ref <18)

## 2023-11-20 LAB — MAGNESIUM: Magnesium: 2.4 mg/dL (ref 1.7–2.4)

## 2023-11-20 MED ORDER — MECLIZINE HCL 12.5 MG PO TABS
25.0000 mg | ORAL_TABLET | Freq: Once | ORAL | Status: AC
  Administered 2023-11-20: 25 mg via ORAL
  Filled 2023-11-20: qty 2

## 2023-11-20 MED ORDER — SODIUM CHLORIDE 0.9 % IV BOLUS
1000.0000 mL | Freq: Once | INTRAVENOUS | Status: AC
  Administered 2023-11-20: 1000 mL via INTRAVENOUS

## 2023-11-20 MED ORDER — DIAZEPAM 5 MG/ML IJ SOLN
5.0000 mg | Freq: Once | INTRAMUSCULAR | Status: AC
  Administered 2023-11-20: 5 mg via INTRAVENOUS
  Filled 2023-11-20: qty 2

## 2023-11-20 MED ORDER — MECLIZINE HCL 25 MG PO TABS
25.0000 mg | ORAL_TABLET | Freq: Three times a day (TID) | ORAL | 0 refills | Status: AC | PRN
Start: 2023-11-20 — End: ?

## 2023-11-20 NOTE — ED Provider Notes (Signed)
 [place] EMERGENCY DEPARTMENT AT [facility] Provider Note   CSN: [id number] Arrival date & time: 11/20/23  9145     Patient presents with: Dizziness   Erika Ruiz is a 58 y.o. female.   Patient is a 58 year old female with a past medical history of vertigo and CVA who presents to the emergency department with a chief complaint of dizziness which has been ongoing since yesterday.  She notes that the dizziness has been positional in nature.  She notes that this feels consistent with her previous bouts of vertigo.  She mitts to associated nausea and vomiting as well.  She has had no associated chest pain, shortness of breath, palpitations.  She denies any abdominal pain.  She has had no associated headaches, pain to neck or back.  She denies any numbness, paresthesias or unilateral weakness.   Dizziness      Prior to Admission medications   Medication Sig Start Date End Date Taking? Authorizing Provider  aspirin  EC 81 MG EC tablet Take 1 tablet (81 mg total) by mouth daily. Swallow whole. X 90 days 09/18/20   Evonnie Lenis, MD  cetirizine (ZYRTEC) 10 MG chewable tablet Chew 10 mg by mouth daily.    [provider]  ezetimibe  (ZETIA ) 10 MG tablet TAKE ONE (1) TABLET BY MOUTH EVERY DAY 09/13/23   Joesph Annabella HERO, FNP  pravastatin  (PRAVACHOL ) 40 MG tablet Take 1 tablet (40 mg total) by mouth daily. 08/23/23   Joesph Annabella HERO, FNP  telmisartan  (MICARDIS ) 80 MG tablet Take 1 tablet (80 mg total) by mouth daily. 06/25/23   Joesph Annabella HERO, FNP    Allergies: Sulfa antibiotics    Review of Systems  Neurological:  Positive for dizziness.  All other systems reviewed and are negative.   Updated Vital Signs BP (!) 191/93 (BP Location: Left Arm)   Pulse 78   Temp 97.6 F (36.4 C) (Oral)   Resp 16   Ht 5' 6 (1.676 m)   Wt 89 kg   SpO2 97%   BMI 31.67 kg/m   Physical Exam Vitals and nursing note reviewed.  Constitutional:      Appearance: Normal appearance.   HENT:     Head: Normocephalic and atraumatic.     Nose: Nose normal.     Mouth/Throat:     Mouth: Mucous membranes are moist.  Eyes:     Extraocular Movements: Extraocular movements intact.     Conjunctiva/sclera: Conjunctivae normal.     Pupils: Pupils are equal, round, and reactive to light.  Cardiovascular:     Rate and Rhythm: Normal rate and regular rhythm.     Pulses: Normal pulses.     Heart sounds: Normal heart sounds. No murmur heard.    No gallop.  Pulmonary:     Effort: Pulmonary effort is normal. No respiratory distress.     Breath sounds: Normal breath sounds. No stridor. No wheezing, rhonchi or rales.  Abdominal:     General: Abdomen is flat. Bowel sounds are normal. There is no distension.     Palpations: Abdomen is soft.     Tenderness: There is no abdominal tenderness. There is no guarding.  Musculoskeletal:        General: Normal range of motion.     Cervical back: Normal range of motion and neck supple.  Skin:    General: Skin is warm and dry.  Neurological:     General: No focal deficit present.     Mental Status:  She is alert and oriented to person, place, and time. Mental status is at baseline.     Cranial Nerves: No cranial nerve deficit.     Sensory: No sensory deficit.     Motor: No weakness.     Coordination: Coordination normal.     Gait: Gait normal.     Comments: Finger-nose intact, normal rapid alternate movements, normal heel-to-shin, strength 5 out of 5 in bilateral upper and lower extremities, horizontal nystagmus noted  Psychiatric:        Mood and Affect: Mood normal.        Behavior: Behavior normal.        Thought Content: Thought content normal.        Judgment: Judgment normal.     (all labs ordered are listed, but only abnormal results are displayed) Labs Reviewed  COMPREHENSIVE METABOLIC PANEL WITH GFR  CBC WITH DIFFERENTIAL/PLATELET  MAGNESIUM  TROPONIN I (HIGH SENSITIVITY)    EKG: None  Radiology: No results  found.   Procedures   Medications Ordered in the ED  sodium chloride  0.9 % bolus 1,000 mL (has no administration in time range)  meclizine  (ANTIVERT ) tablet 25 mg (has no administration in time range)  diazepam  (VALIUM ) injection 5 mg (has no administration in time range)                                    Medical Decision Making Amount and/or Complexity of Data Reviewed Labs: ordered. Radiology: ordered.  Risk Prescription drug management.   This patient presents to the ED for concern of dizziness differential diagnosis includes vertigo, Mnire's disease, Lyme arthritis, CVA, TIA, intracranial hemorrhage    Additional history obtained:  Additional history obtained from medical records External records from outside source obtained and reviewed including medical records   Lab Tests:  I Ordered, and personally interpreted labs.  The pertinent results include: No leukocytosis, no anemia, normal kidney function liver function, normal electrolytes, normal magnesium, negative troponin   Imaging Studies ordered:  I ordered imaging studies including CT scan of head I independently visualized and interpreted imaging which showed no acute intracranial findings I agree with the radiologist interpretation   Medicines ordered and prescription drug management:  I ordered medication including meclizine , Valium , IV fluids for dizziness Reevaluation of the patient after these medicines showed that the patient improved I have reviewed the patients home medicines and have made adjustments as needed   Problem List / ED Course:  Patient symptoms have greatly improved in the emergency department after treatment of her vertigo.  Patient does note that symptoms are consistent with her previous bouts of vertigo.  Symptoms are completely positional in nature and she does have horizontal nystagmus.  Did discuss the possibility of MRI with the patient that she has declined at this point.   She has no other concerning findings on physical exam at this point.  There was no cerebellar abnormalities noted with neurological testing.  Low suspicion for CVA at this point.  Will continue symptomatic treatment on an outpatient basis.  Close follow-up with primary care doctor was discussed as well as strict turn precautions for any new or worsening symptoms.  Patient voiced understanding to the plan and had no additional questions.   Social Determinants of Health:  None        Final diagnoses:  None    ED Discharge Orders     None  Daralene Lonni BIRCH, PA-C 11/20/23 1127    Towana Ozell BROCKS, MD 11/20/23 [phone]

## 2023-11-20 NOTE — ED Triage Notes (Signed)
 Pt arrived via POV c/o dizziness and nausea since yesterday. Pt denies CP, denies SOB. Pt does report Hx of Vertigo at one point.

## 2023-11-20 NOTE — Discharge Instructions (Signed)
 Please follow-up close with your primary care doctor on an outpatient basis.  Return to emergency department immediately for any new or worsening symptoms.

## 2023-11-21 ENCOUNTER — Encounter: Payer: No Typology Code available for payment source | Admitting: Family Medicine

## 2023-12-20 ENCOUNTER — Telehealth: Payer: Self-pay | Admitting: Internal Medicine

## 2023-12-20 ENCOUNTER — Ambulatory Visit: Attending: Internal Medicine | Admitting: Internal Medicine

## 2023-12-20 ENCOUNTER — Encounter: Payer: Self-pay | Admitting: Internal Medicine

## 2023-12-20 ENCOUNTER — Other Ambulatory Visit: Payer: Self-pay | Admitting: Internal Medicine

## 2023-12-20 ENCOUNTER — Telehealth: Payer: Self-pay

## 2023-12-20 ENCOUNTER — Ambulatory Visit: Attending: Internal Medicine

## 2023-12-20 VITALS — BP 160/100 | Ht 66.0 in | Wt 199.0 lb

## 2023-12-20 DIAGNOSIS — R42 Dizziness and giddiness: Secondary | ICD-10-CM | POA: Diagnosis not present

## 2023-12-20 DIAGNOSIS — Z136 Encounter for screening for cardiovascular disorders: Secondary | ICD-10-CM

## 2023-12-20 DIAGNOSIS — R079 Chest pain, unspecified: Secondary | ICD-10-CM | POA: Insufficient documentation

## 2023-12-20 MED ORDER — REPATHA SURECLICK 140 MG/ML ~~LOC~~ SOAJ: 140.0000 mg | SUBCUTANEOUS | 2 refills | Status: DC

## 2023-12-20 NOTE — Telephone Encounter (Signed)
 Checking percert on the following    LONG TERM MONITOR XT (3-14 DAYS)

## 2023-12-20 NOTE — Telephone Encounter (Signed)
 Sent after Dr. Mallipeddi had seen patient and left: She does not need coronary calcium  scoring test.  Please cancel.  We can start PCSK9 inhibitors, start Repatha  140 every 2 weeks. Reason why she does not need coronary calcium  test is that she already had a stroke around 3 years ago.  Spoke with husband to let him know that medication as been sent it. Already aware that test has been cancelled. Verbalized understanding.

## 2023-12-20 NOTE — Patient Instructions (Addendum)
 Medication Instructions:  Your physician recommends that you continue on your current medications as directed. Please refer to the Current Medication list given to you today.   Labwork: None  Testing/Procedures: Calcium  Scoring Test at [facility] Your physician has recommended that you wear a Zio monitor.   This monitor is a medical device that records the heart's electrical activity. Doctors most often use these monitors to diagnose arrhythmias. Arrhythmias are problems with the speed or rhythm of the heartbeat. The monitor is a small device applied to your chest. You can wear one while you do your normal daily activities. While wearing this monitor if you have any symptoms to push the button and record what you felt. Once you have worn this monitor for the period of time provider prescribed (for 14 days), you will return the monitor device in the postage paid box. Once it is returned they will download the data collected and provide us  with a report which the provider will then review and we will call you with those results. Important tips:  Avoid showering during the first 24 hours of wearing the monitor. Avoid excessive sweating to help maximize wear time. Do not submerge the device, no hot tubs, and no swimming pools. Keep any lotions or oils away from the patch. After 24 hours you may shower with the patch on. Take brief showers with your back facing the shower head.  Do not remove patch once it has been placed because that will interrupt data and decrease adhesive wear time. Push the button when you have any symptoms and write down what you were feeling. Once you have completed wearing your monitor, remove and place into box which has postage paid and place in your outgoing mailbox.  If for some reason you have misplaced your box then call our office and we can provide another box and/or mail it off for you.  Your physician has requested that you have en exercise stress myoview. For  further information please visit [email or web address] Please follow instruction sheet, as given.   Follow-Up: Your physician recommends that you schedule a follow-up appointment in: Pending Results  Any Other Special Instructions Will Be Listed Below (If Applicable). Thank you for choosing [facility]!     If you need a refill on your cardiac medications before your next appointment, please call your pharmacy.

## 2023-12-20 NOTE — Progress Notes (Signed)
 Cardiology Office Note  Date: 12/20/2023   ID: Erika Ruiz, DOB May 15, 1966, MRN [id number]  PCP:  Joesph Annabella HERO, FNP  Cardiologist:  Diannah SHAUNNA Maywood, MD Electrophysiologist:  None   History of Present Illness: Erika Ruiz is a 58 y.o. female  Referred to cardiology clinic for evaluation chest pain.  Patient has chest pain especially when she bends down and lifts up anything.  She also has some chest pain with exertion.  She is not able to give any further history about chest pain with exertion activities.  Lasts for about a minute or 2.  Frequency has been few times per month.  She also started to feel dizzy for the last 4 months for which she was prescribed meclizine  that helped some, according to the patient but she clearly denies any room spinning sensation.  EKG from July 2025 showed NSR.  Past Medical History:  Diagnosis Date   Hyperlipemia    Hypertension    Uterine cancer (HCC) 1987   Vitamin D  deficiency disease     Past Surgical History:  Procedure Laterality Date   ABDOMINAL HYSTERECTOMY     due to CA   BIOPSY  03/13/2017   Procedure: BIOPSY;  Surgeon: Harvey Margo CROME, MD;  Location: [facility];  Service: Endoscopy;;  gastric   BREAST EXCISIONAL BIOPSY Left    BREAST LUMPECTOMY Left    COLONOSCOPY WITH PROPOFOL  N/A 03/13/2017   Procedure: COLONOSCOPY WITH PROPOFOL ;  Surgeon: Harvey Margo CROME, MD;  Location: [facility];  Service: Endoscopy;  Laterality: N/A;  11:00am   ESOPHAGOGASTRODUODENOSCOPY (EGD) WITH PROPOFOL  N/A 03/13/2017   Procedure: ESOPHAGOGASTRODUODENOSCOPY (EGD) WITH PROPOFOL ;  Surgeon: Harvey Margo CROME, MD;  Location: [facility];  Service: Endoscopy;  Laterality: N/A;   LEFT OOPHORECTOMY     POLYPECTOMY  03/13/2017   Procedure: POLYPECTOMY;  Surgeon: Harvey Margo CROME, MD;  Location: [facility];  Service: Endoscopy;;  colon   VAGINAL DELIVERY     X2    Current Outpatient Medications  Medication Sig Dispense Refill    aspirin  EC 81 MG EC tablet Take 1 tablet (81 mg total) by mouth daily. Swallow whole. X 90 days 30 tablet 11   cetirizine (ZYRTEC) 10 MG chewable tablet Chew 10 mg by mouth daily.     ezetimibe  (ZETIA ) 10 MG tablet TAKE ONE (1) TABLET BY MOUTH EVERY DAY 90 tablet 1   meclizine  (ANTIVERT ) 25 MG tablet Take 1 tablet (25 mg total) by mouth 3 (three) times daily as needed for dizziness. 30 tablet 0   telmisartan  (MICARDIS ) 80 MG tablet Take 1 tablet (80 mg total) by mouth daily. 100 tablet 1   No current facility-administered medications for this visit.   Allergies:  Sulfa antibiotics   Social History: The patient  reports that she has never smoked. She has never been exposed to tobacco smoke. She has never used smokeless tobacco. She reports that she does not drink alcohol and does not use drugs.   Family History: The patient's family history includes Breast cancer in her mother; Colon cancer (age of onset: 26) in her sister; Colon polyps in her brother; Diabetes in her father; Heart disease in her father; Kidney disease in her father; Ovarian cancer (age of onset: 53) in her paternal aunt.   ROS:  Please see the history of present illness. Otherwise, complete review of systems is positive for none  All other systems are reviewed and negative.   Physical Exam: VS:  BP (!) 160/100 (BP Location: Right Arm, Cuff Size: Large) Comment: manual  Ht 5' 6 (1.676 m)   Wt 199 lb (90.3 kg)   SpO2 97%   BMI 32.12 kg/m , BMI Body mass index is 32.12 kg/m.  Wt Readings from Last 3 Encounters:  12/20/23 199 lb (90.3 kg)  11/20/23 196 lb 3.4 oz (89 kg)  08/23/23 196 lb 3.2 oz (89 kg)    General: Patient appears comfortable at rest. HEENT: Conjunctiva and lids normal, oropharynx clear with moist mucosa. Neck: Supple, no elevated JVP or carotid bruits, no thyromegaly. Lungs: Clear to auscultation, nonlabored breathing at rest. Cardiac: Regular rate and rhythm, no S3 or significant systolic murmur, no  pericardial rub. Abdomen: Soft, nontender, no hepatomegaly, bowel sounds present, no guarding or rebound. Extremities: No pitting edema, distal pulses 2+. Skin: Warm and dry. Musculoskeletal: No kyphosis. Neuropsychiatric: Alert and oriented x3, affect grossly appropriate.  Recent Labwork: 11/20/2023: ALT 43; AST 30; BUN 15; Creatinine, Ser 0.85; Hemoglobin 15.2; Magnesium 2.4; Platelets 309; Potassium 3.5; Sodium 141     Component Value Date/Time   CHOL 212 (H) 08/23/2023 1110   TRIG 194 (H) 08/23/2023 1110   HDL 39 (L) 08/23/2023 1110   CHOLHDL 5.4 (H) 08/23/2023 1110   CHOLHDL 7.2 09/16/2020 0840   VLDL 47 (H) 09/16/2020 0840   LDLCALC 138 (H) 08/23/2023 1110    Assessment and Plan:  Chest pain - Mixed chest pain.  Typically occurs when bending down and lifting, likely musculoskeletal.  She also has exertional chest pains which she is not able to describe and expand further.  Last for about 1-2 minutes and frequency few times per month. - Obtain exercise Myoview. - Echocardiogram from 2016 and 2022 within normal limits.  Dizziness - Meclizine  helps some but not completely resolved. - Obtain 2-week event monitor, nonlife.  HLD, not at goal - Lipid panel reviewed, LDL 138 and TG 194, both elevated. - She tried multiple statins in the past, atorvastatin , simvastatin, pravastatin  and rosuvastatin  with resultant myalgias.  Currently on Zetia  10 mg once daily. - Statin intolerant, start PCSK9 inhibitors.  HTN, controlled with an element of whitecoat HTN - Continue telmisartan  80 mg once daily. - Home blood pressures range around 120 to 130 mmHg.  History of CVA - Continue aspirin  81 mg once daily. - Myalgias with statins, statin intolerant. - Start Repatha  140 every 2 weeks.     Medication Adjustments/Labs and Tests Ordered: Current medicines are reviewed at length with the patient today.  Concerns regarding medicines are outlined above.    Disposition:  Follow up pending  results  Signed Amariyana Heacox Priya Sanvi Ehler, MD, 12/20/2023 1:33 PM    [facility] [address] [place], [place], [place] [address]

## 2023-12-21 ENCOUNTER — Telehealth: Payer: Self-pay | Admitting: Pharmacy Technician

## 2023-12-21 ENCOUNTER — Other Ambulatory Visit (HOSPITAL_COMMUNITY): Payer: Self-pay

## 2023-12-21 NOTE — Telephone Encounter (Signed)
 FROM STAFF MESSAGES  Pharmacy Patient Advocate Encounter   Received notification from STAFF MESSAGES that prior authorization for REPATHA  is required/requested.   Insurance verification completed.   The patient is insured through [employer] .   Per test claim: PA required; PA submitted to above mentioned insurance via latent Key/confirmation #/EOC [id number] Status is pending

## 2023-12-21 NOTE — Telephone Encounter (Signed)
 Pharmacy Patient Advocate Encounter  Received notification from [employer] that Prior Authorization for repatha  has been APPROVED from 09/22/23 to 12/20/24   PA #/Case ID/Reference #: [id number]

## 2023-12-28 ENCOUNTER — Encounter: Payer: Self-pay | Admitting: Family Medicine

## 2023-12-28 ENCOUNTER — Ambulatory Visit (INDEPENDENT_AMBULATORY_CARE_PROVIDER_SITE_OTHER): Payer: No Typology Code available for payment source | Admitting: Family Medicine

## 2023-12-28 VITALS — BP 127/67 | HR 70 | Temp 97.9°F | Ht 66.0 in | Wt 197.2 lb

## 2023-12-28 DIAGNOSIS — E6609 Other obesity due to excess calories: Secondary | ICD-10-CM | POA: Diagnosis not present

## 2023-12-28 DIAGNOSIS — E538 Deficiency of other specified B group vitamins: Secondary | ICD-10-CM | POA: Diagnosis not present

## 2023-12-28 DIAGNOSIS — I10 Essential (primary) hypertension: Secondary | ICD-10-CM

## 2023-12-28 DIAGNOSIS — Z0001 Encounter for general adult medical examination with abnormal findings: Secondary | ICD-10-CM

## 2023-12-28 DIAGNOSIS — Z6831 Body mass index (BMI) 31.0-31.9, adult: Secondary | ICD-10-CM | POA: Diagnosis not present

## 2023-12-28 DIAGNOSIS — Z0000 Encounter for general adult medical examination without abnormal findings: Secondary | ICD-10-CM

## 2023-12-28 DIAGNOSIS — R7303 Prediabetes: Secondary | ICD-10-CM

## 2023-12-28 DIAGNOSIS — R946 Abnormal results of thyroid function studies: Secondary | ICD-10-CM | POA: Diagnosis not present

## 2023-12-28 DIAGNOSIS — E782 Mixed hyperlipidemia: Secondary | ICD-10-CM

## 2023-12-28 DIAGNOSIS — Z0184 Encounter for antibody response examination: Secondary | ICD-10-CM

## 2023-12-28 DIAGNOSIS — E559 Vitamin D deficiency, unspecified: Secondary | ICD-10-CM | POA: Diagnosis not present

## 2023-12-28 DIAGNOSIS — E66811 Obesity, class 1: Secondary | ICD-10-CM | POA: Diagnosis not present

## 2023-12-28 LAB — BAYER DCA HB A1C WAIVED: HB A1C (BAYER DCA - WAIVED): 5.5 % (ref 4.8–5.6)

## 2023-12-28 NOTE — Patient Instructions (Signed)

## 2023-12-28 NOTE — Progress Notes (Signed)
 Complete physical exam  Patient: Erika Ruiz   DOB: [birth year]   58 y.o. Female  MRN: [id number]  Subjective:    Chief Complaint  Patient presents with   Annual Exam    AGNIESZKA NEWHOUSE is a 58 y.o. female who presents today for a complete physical exam. She reports consuming a general diet. She tries to walk daily. She generally feels fairly well. She reports sleeping fairly well. She does not have additional problems to discuss today.   She is seeing cardiology for some intermittent chest pain, dizziness.    Cardiology is working with her to try repatha . She has a stress test upcoming.   Most recent fall risk assessment:    12/28/2023    8:07 AM  Fall Risk   Falls in the past year? 0     Most recent depression screenings:    12/28/2023    8:08 AM 08/23/2023   10:35 AM  PHQ 2/9 Scores  PHQ - 2 Score 0 0  PHQ- 9 Score 3 6    Vision:Within last year and Dental: No current dental problems and Receives regular dental care    Patient Care Team: Joesph Annabella HERO, FNP as PCP - General (Family Medicine) Mallipeddi, Diannah SQUIBB, MD as PCP - Cardiology (Cardiology) Harvey Margo CROME, MD (Inactive) as Consulting Physician (Gastroenterology)   Outpatient Medications Prior to Visit  Medication Sig   aspirin  EC 81 MG EC tablet Take 1 tablet (81 mg total) by mouth daily. Swallow whole. X 90 days   cetirizine (ZYRTEC) 10 MG chewable tablet Chew 10 mg by mouth daily.   ezetimibe  (ZETIA ) 10 MG tablet TAKE ONE (1) TABLET BY MOUTH EVERY DAY   meclizine  (ANTIVERT ) 25 MG tablet Take 1 tablet (25 mg total) by mouth 3 (three) times daily as needed for dizziness.   telmisartan  (MICARDIS ) 80 MG tablet Take 1 tablet (80 mg total) by mouth daily.   [DISCONTINUED] Evolocumab  (REPATHA  SURECLICK) 140 MG/ML SOAJ Inject 140 mg into the skin every 14 (fourteen) days.   No facility-administered medications prior to visit.    ROS Negative unless specially indicated above in HPI.      Objective:     BP 127/67 Comment: at home reading per pt  Pulse 70   Temp 97.9 F (36.6 C) (Temporal)   Ht 5' 6 (1.676 m)   Wt 197 lb 3.2 oz (89.4 kg)   SpO2 98%   BMI 31.83 kg/m    Physical Exam Vitals and nursing note reviewed.  Constitutional:      General: She is not in acute distress.    Appearance: Normal appearance. She is not ill-appearing, toxic-appearing or diaphoretic.  HENT:     Head: Normocephalic.     Right Ear: Tympanic membrane, ear canal and external ear normal.     Left Ear: Tympanic membrane, ear canal and external ear normal.     Nose: Nose normal.     Mouth/Throat:     Mouth: Mucous membranes are moist.     Pharynx: Oropharynx is clear.  Eyes:     Extraocular Movements: Extraocular movements intact.     Conjunctiva/sclera: Conjunctivae normal.     Pupils: Pupils are equal, round, and reactive to light.  Neck:     Thyroid : No thyroid  mass, thyromegaly or thyroid  tenderness.  Cardiovascular:     Rate and Rhythm: Normal rate and regular rhythm.     Pulses: Normal pulses.     Heart sounds: Normal heart sounds.  No murmur heard.    No friction rub. No gallop.  Pulmonary:     Effort: Pulmonary effort is normal.     Breath sounds: Normal breath sounds.  Abdominal:     General: Bowel sounds are normal. There is no distension.     Palpations: Abdomen is soft. There is no mass.     Tenderness: There is no abdominal tenderness. There is no guarding.  Musculoskeletal:     Cervical back: Normal range of motion and neck supple. No tenderness.     Right lower leg: No edema.     Left lower leg: No edema.  Skin:    General: Skin is warm and dry.     Capillary Refill: Capillary refill takes less than 2 seconds.     Findings: No lesion or rash.  Neurological:     General: No focal deficit present.     Mental Status: She is alert and oriented to person, place, and time.     Cranial Nerves: No cranial nerve deficit.     Motor: No weakness.     Gait: Gait  normal.  Psychiatric:        Mood and Affect: Mood normal.        Behavior: Behavior normal.        Thought Content: Thought content normal.        Judgment: Judgment normal.      No results found for any visits on 12/28/23.     Assessment & Plan:    Routine Health Maintenance and Physical Exam  Finnley was seen today for annual exam.  Diagnoses and all orders for this visit:  Routine general medical examination at a health care facility  Immunity status testing -     Hepatitis B surface antibody,quantitative  Primary hypertension Well controlled on current regimen.  -     CBC with Differential/Platelet -     CMP14+EGFR -     Lipid panel -     TSH  Mixed hyperlipidemia Working with cardiology to try repatha . Fasting lipid panel pending.  -     Lipid panel  B12 deficiency -     Vitamin B12  Vitamin D  deficiency -     VITAMIN D  25 Hydroxy (Vit-D Deficiency, Fractures)  Prediabetes -     Bayer DCA Hb A1c Waived  Class 1 obesity due to excess calories with serious comorbidity and body mass index (BMI) of 31.0 to 31.9 in adult Labs pending. Diet, exercise, weight loss.  -     CBC with Differential/Platelet -     CMP14+EGFR -     Lipid panel -     TSH    Immunization History  Administered Date(s) Administered   Influenza, Seasonal, Injecte, Preservative Fre 02/21/2023   Influenza,inj,Quad PF,6+ Mos 02/26/2016, 03/24/2022   Influenza-Unspecified 03/10/2021    Health Maintenance  Topic Date Due   Medicare Annual Wellness (AWV)  Never done   Hepatitis B Vaccines 19-59 Average Risk (1 of 3 - 19+ 3-dose series) Never done   DTaP/Tdap/Td (1 - Tdap) 05/13/2024 (Originally 04/03/1985)   COVID-19 Vaccine (1 - 2024-25 season) 05/29/2024 (Originally 01/14/2023)   INFLUENZA VACCINE  08/12/2024 (Originally 12/14/2023)   Pneumococcal Vaccine: 50+ Years (1 of 1 - PCV) 12/27/2024 (Originally 04/03/2016)   Zoster Vaccines- Shingrix (1 of 2) 03/29/2025 (Originally 04/03/2016)    MAMMOGRAM  04/22/2025   Colonoscopy  03/14/2027   Hepatitis C Screening  Completed   HIV Screening  Completed   HPV  VACCINES  Aged Out   Meningococcal B Vaccine  Aged Out    Discussed health benefits of physical activity, and encouraged her to engage in regular exercise appropriate for her age and condition.  Problem List Items Addressed This Visit       Cardiovascular and Mediastinum   Primary hypertension (Chronic)   Relevant Orders   CBC with Differential/Platelet   CMP14+EGFR   Lipid panel   TSH     Other   Hyperlipidemia (Chronic)   Relevant Orders   Lipid panel   Class 1 obesity due to excess calories with serious comorbidity and body mass index (BMI) of 31.0 to 31.9 in adult   Relevant Orders   CBC with Differential/Platelet   CMP14+EGFR   Lipid panel   TSH   Vitamin D  deficiency   Relevant Orders   VITAMIN D  25 Hydroxy (Vit-D Deficiency, Fractures)   Prediabetes   Relevant Orders   Bayer DCA Hb A1c Waived   B12 deficiency   Relevant Orders   Vitamin B12   Other Visit Diagnoses       Routine general medical examination at a health care facility    -  Primary     Immunity status testing       Relevant Orders   Hepatitis B surface antibody,quantitative      Return in about 6 months (around 06/29/2024) for chronic follow up.   The patient indicates understanding of these issues and agrees with the plan.  [name] Search, FNP

## 2023-12-29 LAB — CBC WITH DIFFERENTIAL/PLATELET
Basophils Absolute: 0.1 x10E3/uL (ref 0.0–0.2)
Basos: 1 %
EOS (ABSOLUTE): 0.1 x10E3/uL (ref 0.0–0.4)
Eos: 2 %
Hematocrit: 42 % (ref 34.0–46.6)
Hemoglobin: 14 g/dL (ref 11.1–15.9)
Immature Grans (Abs): 0 x10E3/uL (ref 0.0–0.1)
Immature Granulocytes: 0 %
Lymphocytes Absolute: 2 x10E3/uL (ref 0.7–3.1)
Lymphs: 35 %
MCH: 31.3 pg (ref 26.6–33.0)
MCHC: 33.3 g/dL (ref 31.5–35.7)
MCV: 94 fL (ref 79–97)
Monocytes Absolute: 0.6 x10E3/uL (ref 0.1–0.9)
Monocytes: 10 %
Neutrophils Absolute: 3 x10E3/uL (ref 1.4–7.0)
Neutrophils: 52 %
Platelets: 298 x10E3/uL (ref 150–450)
RBC: 4.48 x10E6/uL (ref 3.77–5.28)
RDW: 13.4 % (ref 11.7–15.4)
WBC: 5.7 x10E3/uL (ref 3.4–10.8)

## 2023-12-29 LAB — LIPID PANEL
Chol/HDL Ratio: 5.6 ratio — ABNORMAL HIGH (ref 0.0–4.4)
Cholesterol, Total: 195 mg/dL (ref 100–199)
HDL: 35 mg/dL — ABNORMAL LOW (ref >39)
LDL Chol Calc (NIH): 122 mg/dL — ABNORMAL HIGH (ref 0–99)
Triglycerides: 212 mg/dL — ABNORMAL HIGH (ref 0–149)
VLDL Cholesterol Cal: 38 mg/dL (ref 5–40)

## 2023-12-29 LAB — CMP14+EGFR
ALT: 32 IU/L (ref 0–32)
AST: 19 IU/L (ref 0–40)
Albumin: 4.4 g/dL (ref 3.8–4.9)
Alkaline Phosphatase: 125 IU/L — ABNORMAL HIGH (ref 44–121)
BUN/Creatinine Ratio: 20 (ref 9–23)
BUN: 17 mg/dL (ref 6–24)
Bilirubin Total: 0.4 mg/dL (ref 0.0–1.2)
CO2: 23 mmol/L (ref 20–29)
Calcium: 9.6 mg/dL (ref 8.7–10.2)
Chloride: 105 mmol/L (ref 96–106)
Creatinine, Ser: 0.86 mg/dL (ref 0.57–1.00)
Globulin, Total: 2.5 g/dL (ref 1.5–4.5)
Glucose: 114 mg/dL — ABNORMAL HIGH (ref 70–99)
Potassium: 4.7 mmol/L (ref 3.5–5.2)
Sodium: 143 mmol/L (ref 134–144)
Total Protein: 6.9 g/dL (ref 6.0–8.5)
eGFR: 79 mL/min/1.73 (ref >59)

## 2023-12-29 LAB — VITAMIN B12: Vitamin B-12: 204 pg/mL — ABNORMAL LOW (ref 232–1245)

## 2023-12-29 LAB — TSH: TSH: 4.73 u[IU]/mL — ABNORMAL HIGH (ref 0.450–4.500)

## 2023-12-29 LAB — VITAMIN D 25 HYDROXY (VIT D DEFICIENCY, FRACTURES): Vit D, 25-Hydroxy: 19 ng/mL — ABNORMAL LOW (ref 30.0–100.0)

## 2023-12-29 LAB — HEPATITIS B SURFACE ANTIBODY, QUANTITATIVE: Hepatitis B Surf Ab Quant: <3.5 m[IU]/mL — ABNORMAL LOW

## 2023-12-31 ENCOUNTER — Ambulatory Visit: Payer: Self-pay | Admitting: Family Medicine

## 2023-12-31 DIAGNOSIS — E559 Vitamin D deficiency, unspecified: Secondary | ICD-10-CM

## 2023-12-31 DIAGNOSIS — E538 Deficiency of other specified B group vitamins: Secondary | ICD-10-CM

## 2023-12-31 MED ORDER — VITAMIN D (ERGOCALCIFEROL) 1.25 MG (50000 UNIT) PO CAPS: 50000.0000 [IU] | ORAL_CAPSULE | ORAL | 0 refills | Status: DC

## 2024-01-02 LAB — T4, FREE: Free T4: 1.02 ng/dL (ref 0.82–1.77)

## 2024-01-02 LAB — SPECIMEN STATUS REPORT

## 2024-01-07 ENCOUNTER — Encounter (HOSPITAL_BASED_OUTPATIENT_CLINIC_OR_DEPARTMENT_OTHER)
Admission: RE | Admit: 2024-01-07 | Discharge: 2024-01-07 | Disposition: A | Discharge status: Home / Self Care | Source: Ambulatory Visit | Attending: Internal Medicine | Admitting: Internal Medicine

## 2024-01-07 ENCOUNTER — Ambulatory Visit (HOSPITAL_COMMUNITY)
Admission: RE | Admit: 2024-01-07 | Discharge: 2024-01-07 | Disposition: A | Discharge status: Home / Self Care | Source: Ambulatory Visit | Attending: Internal Medicine | Admitting: Internal Medicine

## 2024-01-07 ENCOUNTER — Other Ambulatory Visit: Payer: Self-pay | Admitting: Physician Assistant

## 2024-01-07 DIAGNOSIS — R079 Chest pain, unspecified: Secondary | ICD-10-CM | POA: Diagnosis not present

## 2024-01-07 LAB — NM MYOCAR MULTI W/SPECT W/WALL MOTION / EF
Angina Index: 0
Base ST Depression (mm): 0 mm
Duke Treadmill Score: 3
Estimated workload: 4.6
Exercise duration (min): 2 min
Exercise duration (sec): 56 s
LV dias vol: 70 mL (ref 46–106)
LV sys vol: 22 mL (ref 3.8–5.2)
MPHR: 163 {beats}/min
Nuc Stress EF: 68 %
Peak HR: 153 {beats}/min
Percent HR: 93 %
RATE: 0.4
RPE: 13
Rest HR: 96 {beats}/min
Rest Nuclear Isotope Dose: 10.7 mCi
SDS: 1
SRS: 0
SSS: 1
ST Depression (mm): 0 mm
Stress Nuclear Isotope Dose: 33 mCi
TID: 0.97

## 2024-01-07 MED ORDER — TECHNETIUM TC 99M TETROFOSMIN IV KIT
10.0000 | PACK | Freq: Once | INTRAVENOUS | Status: AC | PRN
  Administered 2024-01-07: 10.7 via INTRAVENOUS

## 2024-01-07 MED ORDER — TECHNETIUM TC 99M TETROFOSMIN IV KIT
30.0000 | PACK | Freq: Once | INTRAVENOUS | Status: AC | PRN
  Administered 2024-01-07: 33 via INTRAVENOUS

## 2024-01-07 NOTE — Progress Notes (Signed)
     Santana KATHEE Samuel presented for a Exercise Myoview  nuclear stress test today.  I Lorette CINDERELLA Kapur, PA-C, provided direct supervision and was present during the stress portion of the study today, which was completed without significant symptoms, immediate complications, or acute ST/T changes on ECG.  Stress imaging is pending at this time.  Preliminary ECG findings may be listed in the chart, but the stress test result will not be finalized until perfusion imaging is complete.  Lorette CINDERELLA Kapur, PA-C  01/07/2024, 10:04 AM

## 2024-01-11 ENCOUNTER — Ambulatory Visit: Payer: Self-pay | Admitting: Internal Medicine

## 2024-01-15 ENCOUNTER — Other Ambulatory Visit: Payer: Self-pay | Admitting: *Deleted

## 2024-01-15 MED ORDER — TELMISARTAN 80 MG PO TABS: 80.0000 mg | ORAL_TABLET | Freq: Every day | ORAL | 0 refills | Status: DC

## 2024-01-23 DIAGNOSIS — R42 Dizziness and giddiness: Secondary | ICD-10-CM

## 2024-01-25 ENCOUNTER — Ambulatory Visit: Payer: Self-pay | Admitting: Internal Medicine

## 2024-02-06 ENCOUNTER — Ambulatory Visit (INDEPENDENT_AMBULATORY_CARE_PROVIDER_SITE_OTHER)

## 2024-02-06 DIAGNOSIS — Z23 Encounter for immunization: Secondary | ICD-10-CM | POA: Diagnosis not present

## 2024-02-22 ENCOUNTER — Ambulatory Visit: Admitting: Family Medicine

## 2024-02-22 ENCOUNTER — Encounter: Payer: Self-pay | Admitting: Family Medicine

## 2024-02-22 VITALS — BP 132/74 | HR 89 | Temp 97.7°F | Ht 66.0 in | Wt 200.2 lb

## 2024-02-22 DIAGNOSIS — R7303 Prediabetes: Secondary | ICD-10-CM

## 2024-02-22 DIAGNOSIS — Z1283 Encounter for screening for malignant neoplasm of skin: Secondary | ICD-10-CM | POA: Diagnosis not present

## 2024-02-22 DIAGNOSIS — I6930 Unspecified sequelae of cerebral infarction: Secondary | ICD-10-CM

## 2024-02-22 DIAGNOSIS — I10 Essential (primary) hypertension: Secondary | ICD-10-CM | POA: Diagnosis not present

## 2024-02-22 DIAGNOSIS — E538 Deficiency of other specified B group vitamins: Secondary | ICD-10-CM

## 2024-02-22 DIAGNOSIS — Z6832 Body mass index (BMI) 32.0-32.9, adult: Secondary | ICD-10-CM | POA: Diagnosis not present

## 2024-02-22 DIAGNOSIS — L918 Other hypertrophic disorders of the skin: Secondary | ICD-10-CM

## 2024-02-22 DIAGNOSIS — E6609 Other obesity due to excess calories: Secondary | ICD-10-CM

## 2024-02-22 DIAGNOSIS — E66811 Obesity, class 1: Secondary | ICD-10-CM | POA: Diagnosis not present

## 2024-02-22 DIAGNOSIS — E782 Mixed hyperlipidemia: Secondary | ICD-10-CM

## 2024-02-22 MED ORDER — WEGOVY 0.25 MG/0.5ML ~~LOC~~ SOAJ: 0.2500 mg | SUBCUTANEOUS | 0 refills | Status: DC

## 2024-02-22 MED ORDER — WEGOVY 0.5 MG/0.5ML ~~LOC~~ SOAJ: 0.5000 mg | SUBCUTANEOUS | 0 refills | Status: DC

## 2024-02-22 NOTE — Progress Notes (Signed)
 Established Patient Office Visit  Subjective   Patient ID: Erika Ruiz, female    DOB: [birth year]  Age: 58 y.o. MRN: [id number]  Chief Complaint  Patient presents with   Skin Tag    HPI  History of Present Illness   Erika Ruiz is a 58 year old female who presents for follow-up on blood pressure management and dermatological evaluation.  Hypertension management - Home blood pressure readings have been stable - No recent episodes of chest pain - No shortness of breath  Periorbital skin tag - Skin tag with pigmentation present under the eye - Occasional pruritus in the affected area - Ophthalmologist recommend dermatology referral regarding this - No history of precancerous skin lesions or skin cancer  Weight management and hyperlipidemia - Considering weight loss medication - Cholesterol level is 200 mg/dL - Frustration with current eating habits, often consuming only one meal per day - No family history of thyroid  cancer - Stepdaughter experienced severe side effects from a weight loss injection, including frequent vomiting        ROS As per HPI.    Objective:     BP 132/74 Comment: at home reading per pt  Pulse 89   Temp 97.7 F (36.5 C) (Temporal)   Ht 5' 6 (1.676 m)   Wt 200 lb 3.2 oz (90.8 kg)   SpO2 95%   BMI 32.31 kg/m    Physical Exam Vitals and nursing note reviewed.  Constitutional:      General: She is not in acute distress.    Appearance: She is obese. She is not ill-appearing, toxic-appearing or diaphoretic.  Cardiovascular:     Rate and Rhythm: Normal rate and regular rhythm.     Pulses: Normal pulses.     Heart sounds: Normal heart sounds. No murmur heard. Pulmonary:     Effort: Pulmonary effort is normal. No respiratory distress.     Breath sounds: Normal breath sounds.  Abdominal:     General: Bowel sounds are normal. There is no distension.     Palpations: Abdomen is soft. There is no mass.     Tenderness: There is no  abdominal tenderness. There is no guarding or rebound.  Musculoskeletal:     Cervical back: Neck supple. No tenderness.     Right lower leg: No edema.     Left lower leg: No edema.  Lymphadenopathy:     Cervical: No cervical adenopathy.  Skin:    General: Skin is warm and dry.     Findings: Lesion (pigmented raised lesion to right lower eyelid. Pictured below) present.  Neurological:     General: No focal deficit present.     Mental Status: She is alert and oriented to person, place, and time.  Psychiatric:        Mood and Affect: Mood normal.        Behavior: Behavior normal.      No results found for any visits on 02/22/24.    The ASCVD Risk score (Arnett DK, et al., 2019) failed to calculate for the following reasons:   Risk score cannot be calculated because patient has a medical history suggesting prior/existing ASCVD    Assessment & Plan:   Erika Ruiz was seen today for skin tag.  Diagnoses and all orders for this visit:  History of CVA with residual deficit -     semaglutide-weight management (WEGOVY) 0.25 MG/0.5ML SOAJ SQ injection; Inject 0.25 mg into the skin once a week. -  semaglutide-weight management (WEGOVY) 0.5 MG/0.5ML SOAJ SQ injection; Inject 0.5 mg into the skin once a week.  Class 1 obesity due to excess calories with serious comorbidity and body mass index (BMI) of 32.0 to 32.9 in adult -     semaglutide-weight management (WEGOVY) 0.25 MG/0.5ML SOAJ SQ injection; Inject 0.25 mg into the skin once a week. -     semaglutide-weight management (WEGOVY) 0.5 MG/0.5ML SOAJ SQ injection; Inject 0.5 mg into the skin once a week.  Prediabetes  Primary hypertension  Mixed hyperlipidemia  Skin tag -     Ambulatory referral to Dermatology  Skin exam, screening for cancer -     Ambulatory referral to Dermatology  B12 deficiency -     Vitamin B12      History of CVA Obesity Obesity with previous stroke. Discussed [name] as a treatment option.  Explained potential side effects and insurance coverage challenges. - Submit prior authorization for Wegovy. - Educate on potential side effects of Wegovy and dietary adjustments. - No contraindications to [name] - Schedule follow-up in 6 weeks if [name] is covered.  Prediabetes Prediabetes with potential benefit from weight loss through [name], which may help lower A1c levels.  Primary hypertension Blood pressure is well-controlled at home. Weight loss through [name] may further help in managing blood pressure.  Mixed hyperlipidemia Mixed hyperlipidemia. Weight loss through [name] may help in lowering cholesterol levels.  Deficiency of B group vitamins Plan to check B12 levels to assess for deficiency of B group vitamins. - Order B12 level test.  Skin tag  Skin lesion under the eye with occasional itching. Referred to dermatology for further evaluation. - Submit referral to dermatology. - Inform her that dermatology office will contact her in a week or two.       Return in about 6 weeks (around 04/04/2024) for medication follow up.   The patient indicates understanding of these issues and agrees with the plan.  [name] Search, FNP

## 2024-02-23 LAB — VITAMIN B12: Vitamin B-12: 505 pg/mL (ref 232–1245)

## 2024-02-25 ENCOUNTER — Ambulatory Visit: Payer: Self-pay | Admitting: Family Medicine

## 2024-02-25 ENCOUNTER — Other Ambulatory Visit

## 2024-03-14 ENCOUNTER — Ambulatory Visit: Admitting: Family

## 2024-03-14 ENCOUNTER — Encounter: Payer: Self-pay | Admitting: Family

## 2024-03-14 VITALS — BP 169/91 | HR 86 | Temp 98.4°F | Ht 66.0 in | Wt 199.8 lb

## 2024-03-14 DIAGNOSIS — M545 Low back pain, unspecified: Secondary | ICD-10-CM | POA: Diagnosis not present

## 2024-03-14 MED ORDER — DICLOFENAC SODIUM 75 MG PO TBEC: 75.0000 mg | DELAYED_RELEASE_TABLET | Freq: Two times a day (BID) | ORAL | 2 refills | Status: DC

## 2024-03-14 MED ORDER — BACLOFEN 10 MG PO TABS: 10.0000 mg | ORAL_TABLET | Freq: Three times a day (TID) | ORAL | 0 refills | Status: AC

## 2024-03-14 MED ORDER — METHYLPREDNISOLONE ACETATE 80 MG/ML IJ SUSP
80.0000 mg | Freq: Once | INTRAMUSCULAR | Status: AC
  Administered 2024-03-14: 80 mg via INTRAMUSCULAR

## 2024-03-14 MED ORDER — KETOROLAC TROMETHAMINE 60 MG/2ML IM SOLN
60.0000 mg | Freq: Once | INTRAMUSCULAR | Status: AC
  Administered 2024-03-14: 60 mg via INTRAMUSCULAR

## 2024-03-14 NOTE — Progress Notes (Signed)
 Subjective:    Patient ID: Erika Ruiz, female    DOB: Aug 08, 1965, 58 y.o.   MRN: [id number]  Chief Complaint  Patient presents with   Back Pain   PT presents to the office today with lower back pain that started yesterday. Reports she bent over yesterday to pick up something and felt pain instantly.  Back Pain This is a new problem. The current episode started yesterday. The problem occurs constantly. The problem is unchanged. The pain is present in the lumbar spine. The quality of the pain is described as aching. The pain is at a severity of 10/10. The pain is moderate. The symptoms are aggravated by standing and bending. Pertinent negatives include no bladder incontinence, bowel incontinence, chest pain, fever or leg pain. Risk factors include obesity. She has tried bed rest (tylenol ) for the symptoms. The treatment provided mild relief.      Review of Systems  Constitutional:  Negative for fever.  Cardiovascular:  Negative for chest pain.  Gastrointestinal:  Negative for bowel incontinence.  Genitourinary:  Negative for bladder incontinence.  Musculoskeletal:  Positive for back pain.  All other systems reviewed and are negative.   Social History   Socioeconomic History   Marital status: Married    Spouse name: Not on file   Number of children: 2   Years of education: Not on file   Highest education level: GED or equivalent  Occupational History   Occupation: cytogeneticist    Employer: [employer]  Tobacco Use   Smoking status: Never    Passive exposure: Never   Smokeless tobacco: Never  Vaping Use   Vaping status: Never Used  Substance and Sexual Activity   Alcohol use: No    Alcohol/week: 0.0 standard drinks of alcohol   Drug use: No   Sexual activity: Yes  Other Topics Concern   Not on file  Social History Narrative   Not on file   Social Drivers of Health   Financial Resource Strain: Low Risk  (03/13/2024)   Overall Financial Resource Strain  (CARDIA)    Difficulty of Paying Living Expenses: Not hard at all  Food Insecurity: No Food Insecurity (03/13/2024)   Hunger Vital Sign    Worried About Running Out of Food in the Last Year: Never true    Ran Out of Food in the Last Year: Never true  Transportation Needs: No Transportation Needs (03/13/2024)   PRAPARE - Administrator, Civil Service (Medical): No    Lack of Transportation (Non-Medical): No  Physical Activity: Insufficiently Active (03/13/2024)   Exercise Vital Sign    Days of Exercise per Week: 7 days    Minutes of Exercise per Session: 20 min  Stress: No Stress Concern Present (03/13/2024)   [employer] of Occupational Health - Occupational Stress Questionnaire    Feeling of Stress: Not at all  Social Connections: Socially Integrated (03/13/2024)   Social Connection and Isolation Panel    Frequency of Communication with Friends and Family: More than three times a week    Frequency of Social Gatherings with Friends and Family: More than three times a week    Attends Religious Services: More than 4 times per year    Active Member of [employer] or Organizations: Yes    Attends Banker Meetings: Patient declined    Marital Status: Married   Family History  Problem Relation Age of Onset   Breast cancer Mother    Heart disease Mother  Diabetes Father    Heart disease Father    Kidney disease Father    Colon cancer Sister 70   Heart disease Sister    Colon polyps Brother    Ovarian cancer Paternal Aunt 66        Objective:   Physical Exam Vitals reviewed.  Constitutional:      General: She is not in acute distress.    Appearance: She is well-developed.  HENT:     Head: Normocephalic and atraumatic.     Right Ear: Tympanic membrane normal.     Left Ear: Tympanic membrane normal.  Eyes:     Pupils: Pupils are equal, round, and reactive to light.  Neck:     Thyroid : No thyromegaly.  Cardiovascular:     Rate and Rhythm: Normal  rate and regular rhythm.     Heart sounds: Normal heart sounds. No murmur heard. Pulmonary:     Effort: Pulmonary effort is normal. No respiratory distress.     Breath sounds: Normal breath sounds. No wheezing.  Abdominal:     General: Bowel sounds are normal. There is no distension.     Palpations: Abdomen is soft.     Tenderness: There is no abdominal tenderness.  Musculoskeletal:        General: Tenderness present.     Cervical back: Normal range of motion and neck supple.     Comments: Pain in lumbar with flexion and extension  Skin:    General: Skin is warm and dry.  Neurological:     Mental Status: She is alert and oriented to person, place, and time.     Cranial Nerves: No cranial nerve deficit.     Deep Tendon Reflexes: Reflexes are normal and symmetric.  Psychiatric:        Behavior: Behavior normal.        Thought Content: Thought content normal.        Judgment: Judgment normal.       BP (!) 169/91   Pulse 86   Temp 98.4 F (36.9 C)   Ht 5' 6 (1.676 m)   Wt 199 lb 12.8 oz (90.6 kg)   SpO2 96%   BMI 32.25 kg/m      Assessment & Plan:  Erika Ruiz comes in today with chief complaint of Back Pain   Diagnosis and orders addressed:  1. Acute bilateral low back pain without sciatica (Primary) Rest Heat ROM exercises, handout given  Start diclofenac BID with food, no other NSAID's  Baclofen as needed, sedation precautions  Follow up if symptoms worsen or do not improve  - methylPREDNISolone acetate (DEPO-MEDROL) injection 80 mg - ketorolac (TORADOL) injection 60 mg - baclofen (LIORESAL) 10 MG tablet; Take 1 tablet (10 mg total) by mouth 3 (three) times daily.  Dispense: 30 each; Refill: 0 - diclofenac (VOLTAREN) 75 MG EC tablet; Take 1 tablet (75 mg total) by mouth 2 (two) times daily.  Dispense: 60 tablet; Refill: 2     Bari Learn, FNP

## 2024-03-14 NOTE — Patient Instructions (Signed)

## 2024-03-17 ENCOUNTER — Emergency Department (HOSPITAL_COMMUNITY)
Admission: EM | Admit: 2024-03-17 | Discharge: 2024-03-17 | Disposition: A | Discharge status: Home / Self Care | Attending: Emergency Medicine | Admitting: Emergency Medicine

## 2024-03-17 ENCOUNTER — Ambulatory Visit: Payer: Self-pay | Admitting: Family Medicine

## 2024-03-17 ENCOUNTER — Encounter: Payer: Self-pay | Admitting: Family Medicine

## 2024-03-17 ENCOUNTER — Encounter (HOSPITAL_COMMUNITY): Payer: Self-pay

## 2024-03-17 ENCOUNTER — Other Ambulatory Visit: Payer: Self-pay

## 2024-03-17 VITALS — BP 125/64 | HR 83 | Temp 98.1°F | Ht 65.5 in | Wt 195.0 lb

## 2024-03-17 DIAGNOSIS — I10 Essential (primary) hypertension: Secondary | ICD-10-CM | POA: Insufficient documentation

## 2024-03-17 DIAGNOSIS — Z0001 Encounter for general adult medical examination with abnormal findings: Secondary | ICD-10-CM | POA: Diagnosis not present

## 2024-03-17 DIAGNOSIS — K0889 Other specified disorders of teeth and supporting structures: Secondary | ICD-10-CM

## 2024-03-17 DIAGNOSIS — I6930 Unspecified sequelae of cerebral infarction: Secondary | ICD-10-CM

## 2024-03-17 DIAGNOSIS — E66811 Obesity, class 1: Secondary | ICD-10-CM | POA: Diagnosis not present

## 2024-03-17 DIAGNOSIS — Z0000 Encounter for general adult medical examination without abnormal findings: Secondary | ICD-10-CM

## 2024-03-17 DIAGNOSIS — E6609 Other obesity due to excess calories: Secondary | ICD-10-CM

## 2024-03-17 DIAGNOSIS — Z6831 Body mass index (BMI) 31.0-31.9, adult: Secondary | ICD-10-CM

## 2024-03-17 DIAGNOSIS — K047 Periapical abscess without sinus: Secondary | ICD-10-CM

## 2024-03-17 DIAGNOSIS — Z7982 Long term (current) use of aspirin: Secondary | ICD-10-CM | POA: Diagnosis not present

## 2024-03-17 MED ORDER — AMOXICILLIN 500 MG PO CAPS: 500.0000 mg | ORAL_CAPSULE | Freq: Three times a day (TID) | ORAL | 0 refills | Status: AC

## 2024-03-17 MED ORDER — OXYCODONE-ACETAMINOPHEN 5-325 MG PO TABS
1.0000 | ORAL_TABLET | Freq: Once | ORAL | Status: AC
  Administered 2024-03-17: 1 via ORAL
  Filled 2024-03-17: qty 1

## 2024-03-17 MED ORDER — IBUPROFEN 800 MG PO TABS: 800.0000 mg | ORAL_TABLET | Freq: Three times a day (TID) | ORAL | 0 refills | Status: AC | PRN

## 2024-03-17 MED ORDER — OXYCODONE-ACETAMINOPHEN 5-325 MG PO TABS: 1.0000 | ORAL_TABLET | Freq: Four times a day (QID) | ORAL | 0 refills | Status: DC | PRN

## 2024-03-17 MED ORDER — AMOXICILLIN 250 MG PO CAPS
500.0000 mg | ORAL_CAPSULE | Freq: Once | ORAL | Status: AC
  Administered 2024-03-17: 500 mg via ORAL
  Filled 2024-03-17: qty 2

## 2024-03-17 MED ORDER — WEGOVY 0.25 MG/0.5ML ~~LOC~~ SOAJ: 0.2500 mg | SUBCUTANEOUS | 0 refills | Status: DC

## 2024-03-17 NOTE — ED Provider Notes (Signed)
 [place] EMERGENCY DEPARTMENT AT [facility] Provider Note   CSN: [id number] Arrival date & time: 03/17/24  2153     Patient presents with: Dental Pain   Erika Ruiz is a 58 y.o. female.   Patient is a 58 year old female who presents emergency department the chief complaint of left upper dental pain which has been ongoing for approximate the past 3 days.  She did see her primary care doctor earlier today who did place her on 800 mg with ibuprofen  3 times daily with no other medications.  Patient does have a history of hypertension and notes that she has been compliant with her medications.  She notes that she feels as though her blood pressure elevation today is secondary to her pain.  Patient notes that she has had no associated pain to her neck, mandible, chest.  There has been no associated shortness of breath.  She has had no associated stridor, dysphagia, drooling, dyspnea.   Dental Pain      Prior to Admission medications   Medication Sig Start Date End Date Taking? Authorizing Provider  amoxicillin  (AMOXIL ) 500 MG capsule Take 1 capsule (500 mg total) by mouth 3 (three) times daily for 10 days. 03/17/24 03/27/24 Yes Daralene Bruckner D, PA-C  oxyCODONE -acetaminophen  (PERCOCET/ROXICET) 5-325 MG tablet Take 1 tablet by mouth every 6 (six) hours as needed for severe pain (pain score 7-10). 03/17/24  Yes Daralene Bruckner BIRCH, PA-C  aspirin  EC 81 MG EC tablet Take 1 tablet (81 mg total) by mouth daily. Swallow whole. X 90 days 09/18/20   Evonnie Lenis, MD  baclofen (LIORESAL) 10 MG tablet Take 1 tablet (10 mg total) by mouth 3 (three) times daily. 03/14/24   Lavell Bari LABOR, FNP  cetirizine (ZYRTEC) 10 MG chewable tablet Chew 10 mg by mouth daily.    [provider]  cholecalciferol (VITAMIN D3) 25 MCG (1000 UNIT) tablet Take 1,000 Units by mouth daily.    [provider]  cyanocobalamin  (VITAMIN B12) 1000 MCG tablet Take 1,000 mcg by mouth daily.     [provider]  ezetimibe  (ZETIA ) 10 MG tablet TAKE ONE (1) TABLET BY MOUTH EVERY DAY 09/13/23   Joesph Annabella HERO, FNP  ibuprofen  (ADVIL ) 800 MG tablet Take 1 tablet (800 mg total) by mouth every 8 (eight) hours as needed. 03/17/24   Joesph Annabella HERO, FNP  meclizine  (ANTIVERT ) 25 MG tablet Take 1 tablet (25 mg total) by mouth 3 (three) times daily as needed for dizziness. 11/20/23   Daralene Bruckner BIRCH, PA-C  semaglutide-weight management (WEGOVY) 0.25 MG/0.5ML SOAJ SQ injection Inject 0.25 mg into the skin once a week. 03/17/24   Joesph Annabella HERO, FNP  telmisartan  (MICARDIS ) 80 MG tablet Take 1 tablet (80 mg total) by mouth daily. 01/15/24   Joesph Annabella HERO, FNP    Allergies: Sulfa antibiotics    Review of Systems  HENT:  Positive for dental problem.   All other systems reviewed and are negative.   Updated Vital Signs BP (!) 203/102   Pulse 90   Temp 98.2 F (36.8 C) (Temporal)   Resp 16   Ht 5' 5 (1.651 m)   Wt 88.5 kg   SpO2 97%   BMI 32.45 kg/m   Physical Exam Vitals and nursing note reviewed.  Constitutional:      General: She is not in acute distress.    Appearance: Normal appearance. She is not ill-appearing.  HENT:     Head: Normocephalic and atraumatic.  Nose: Nose normal.     Mouth/Throat:     Mouth: Mucous membranes are moist.     Comments: Tender to palpation noted over the left upper third molar with mild erythema noted to the gumline, no areas of induration or fluctuance, floor mouth is soft, tolerant secretion without difficulty, no peritonsillar swelling Eyes:     Extraocular Movements: Extraocular movements intact.     Conjunctiva/sclera: Conjunctivae normal.     Pupils: Pupils are equal, round, and reactive to light.  Cardiovascular:     Rate and Rhythm: Normal rate and regular rhythm.     Pulses: Normal pulses.     Heart sounds: Normal heart sounds. No murmur heard.    No gallop.  Pulmonary:     Effort: Pulmonary effort is normal. No  respiratory distress.     Breath sounds: Normal breath sounds. No stridor. No wheezing, rhonchi or rales.  Musculoskeletal:        General: Normal range of motion.     Cervical back: Normal range of motion and neck supple. No rigidity or tenderness.  Lymphadenopathy:     Cervical: No cervical adenopathy.  Skin:    General: Skin is warm and dry.  Neurological:     General: No focal deficit present.     Mental Status: She is alert and oriented to person, place, and time. Mental status is at baseline.     (all labs ordered are listed, but only abnormal results are displayed) Labs Reviewed - No data to display  EKG: None  Radiology: No results found.   Procedures   Medications Ordered in the ED  oxyCODONE -acetaminophen  (PERCOCET/ROXICET) 5-325 MG per tablet 1 tablet (has no administration in time range)  amoxicillin  (AMOXIL ) capsule 500 mg (has no administration in time range)    Clinical Course as of 03/17/24 2325  Mon Mar 17, 2024  2324 Patient blood pressure has improved to 180/79.  She is stable for discharge home at this time. [CR]    Clinical Course User Index [CR] Daralene Lonni BIRCH, PA-C                                 Medical Decision Making Patient is doing well at this time and is stable for discharge.  I do suspect that her hypertension is secondary to her pain.  Pain is completely reproducible with palpation over the left upper third molar with no radiation of the pain to her jaw, neck, chest.  Low suspicion for cardiac etiology of her symptoms at this point and patient is in agreement to forego any further workup at this time.  Patient has stable vital signs with no indication for sepsis.  She has no signs of abscess formation.  There is no indication for Ludwig's angina, retropharyngeal abscess, epiglottitis, peritonsillar abscess.  Patient already has follow-up scheduled with her dentist tomorrow.  She will keep this appointment.  Have provided additional pain  control as well as antibiotics.  Patient voiced understanding to the plan and had no additional questions.  Risk Prescription drug management.        Final diagnoses:  Pain, dental  Dental infection  Hypertension, unspecified type    ED Discharge Orders          Ordered    oxyCODONE -acetaminophen  (PERCOCET/ROXICET) 5-325 MG tablet  Every 6 hours PRN        03/17/24 2310    amoxicillin  (AMOXIL ) 500 MG  capsule  3 times daily        03/17/24 2310               Braxston Quinter D, PA-C 03/17/24 2325    Melvenia Motto, MD 03/17/24 2337

## 2024-03-17 NOTE — Progress Notes (Signed)
 Subjective:   Erika Ruiz is a 58 y.o. female who presents for a Welcome to Medicare Exam.   She does report left upper tooth pain. She has called the dentist but is awaiting a return call for an appointment. No fever. Has been taking tylenol  without relief.   She never heard back regarding Wegovy for weight loss with her history of CVA.   Allergies (verified) Sulfa antibiotics   History: Past Medical History:  Diagnosis Date   Hyperlipemia    Hypertension    Stroke [facility])    Uterine cancer (HCC) 1987   Vitamin D  deficiency disease    Past Surgical History:  Procedure Laterality Date   ABDOMINAL HYSTERECTOMY     due to CA   BIOPSY  03/13/2017   Procedure: BIOPSY;  Surgeon: Harvey Margo CROME, MD;  [facility];  Service: Endoscopy;;  gastric   BREAST EXCISIONAL BIOPSY Left    BREAST LUMPECTOMY Left    COLONOSCOPY WITH PROPOFOL  N/A 03/13/2017   Procedure: COLONOSCOPY WITH PROPOFOL ;  Surgeon: Harvey Margo CROME, MD;  [facility];  Service: Endoscopy;  Laterality: N/A;  11:00am   ESOPHAGOGASTRODUODENOSCOPY (EGD) WITH PROPOFOL  N/A 03/13/2017   Procedure: ESOPHAGOGASTRODUODENOSCOPY (EGD) WITH PROPOFOL ;  Surgeon: Harvey Margo CROME, MD;  [facility];  Service: Endoscopy;  Laterality: N/A;   LEFT OOPHORECTOMY     POLYPECTOMY  03/13/2017   Procedure: POLYPECTOMY;  Surgeon: Harvey Margo CROME, MD;  [facility];  Service: Endoscopy;;  colon   VAGINAL DELIVERY     X2   Family History  Problem Relation Age of Onset   Breast cancer Mother    Heart disease Mother    Diabetes Father    Heart disease Father    Kidney disease Father    Colon cancer Sister 69   Heart disease Sister    Colon polyps Brother    Ovarian cancer Paternal Aunt 94   Social History   Occupational History   Occupation: cytogeneticist    Employer: [employer]  Tobacco Use   Smoking status: Never    Passive exposure: Never   Smokeless tobacco: Never   Vaping Use   Vaping status: Never Used  Substance and Sexual Activity   Alcohol use: No    Alcohol/week: 0.0 standard drinks of alcohol   Drug use: No   Sexual activity: Not Currently   Tobacco Counseling Counseling given: No  SDOH Screenings   Food Insecurity: No Food Insecurity (03/17/2024)  Housing: Unknown (03/17/2024)  Transportation Needs: No Transportation Needs (03/17/2024)  Utilities: Not At Risk (03/17/2024)  Depression (PHQ2-9): Low Risk  (03/17/2024)  Recent Concern: Depression (PHQ2-9) - Medium Risk (02/22/2024)  Financial Resource Strain: Low Risk  (03/13/2024)  Physical Activity: Sufficiently Active (03/17/2024)  Recent Concern: Physical Activity - Insufficiently Active (03/13/2024)  Social Connections: Socially Integrated (03/17/2024)  Stress: No Stress Concern Present (03/13/2024)  Tobacco Use: Low Risk  (03/17/2024)  Health Literacy: Adequate Health Literacy (03/17/2024)   Depression Screen    03/17/2024    9:54 AM 03/14/2024    9:30 AM 03/14/2024    9:29 AM 02/22/2024    8:33 AM 12/28/2023    8:08 AM 08/23/2023   10:35 AM 05/25/2023   10:29 AM  PHQ 2/9 Scores  PHQ - 2 Score 0 0 0 0 0 0 0  PHQ- 9 Score 0 0  5 3 6 3      Goals Addressed  This Visit's Progress     Weight (lb) < 200 lb (90.7 kg) (pt-stated)   195 lb (88.5 kg)     I would like to get to 150 lbs       Functional Status Activities of Daily Living (to include ambulation/medication): (!) (Patient-Rptd) Needs Assist Ambulation: (Patient-Rptd) Independent Home Management: (Patient-Rptd) Independent  Fall Screening Falls in the past year?: (Patient-Rptd) 1 Number of falls in past year: 0 Was there an injury with Fall?: (Patient-Rptd) 0 Fall Risk Category Calculator: 0 Patient Fall Risk Level: Low Fall Risk  Fall Risk Patient at Risk for Falls Due to: No Fall Risks Fall risk Follow up: Falls evaluation completed  Advance Directives (For Healthcare) Does Patient Have a  Medical Advance Directive?: No Would patient like information on creating a medical advance directive?: No - Patient declined         Objective:    Today's Vitals   03/17/24 0949 03/17/24 1003  BP: (!) 158/90 (!) 158/86  Pulse: 83   Temp: 98.1 F (36.7 C)   TempSrc: Temporal   SpO2: 96%   Weight: 195 lb (88.5 kg)   Height: 5' 5.5 (1.664 m)    Body mass index is 31.96 kg/m.   Physical Exam Vitals and nursing note reviewed.  Constitutional:      General: She is not in acute distress.    Appearance: She is obese. She is not ill-appearing, toxic-appearing or diaphoretic.  HENT:     Mouth/Throat:     Mouth: Mucous membranes are moist.     Dentition: Dental caries present. No dental abscesses or gum lesions.  Pulmonary:     Effort: Pulmonary effort is normal.  Musculoskeletal:     Right lower leg: No edema.     Left lower leg: No edema.  Skin:    General: Skin is warm and dry.  Neurological:     Mental Status: She is alert and oriented to person, place, and time. Mental status is at baseline.  Psychiatric:        Mood and Affect: Mood normal.        Behavior: Behavior normal.     Current Medications (verified) Outpatient Encounter Medications as of 03/17/2024  Medication Sig   aspirin  EC 81 MG EC tablet Take 1 tablet (81 mg total) by mouth daily. Swallow whole. X 90 days   baclofen (LIORESAL) 10 MG tablet Take 1 tablet (10 mg total) by mouth 3 (three) times daily.   cetirizine (ZYRTEC) 10 MG chewable tablet Chew 10 mg by mouth daily.   cholecalciferol (VITAMIN D3) 25 MCG (1000 UNIT) tablet Take 1,000 Units by mouth daily.   cyanocobalamin  (VITAMIN B12) 1000 MCG tablet Take 1,000 mcg by mouth daily.   ezetimibe  (ZETIA ) 10 MG tablet TAKE ONE (1) TABLET BY MOUTH EVERY DAY   meclizine  (ANTIVERT ) 25 MG tablet Take 1 tablet (25 mg total) by mouth 3 (three) times daily as needed for dizziness.   telmisartan  (MICARDIS ) 80 MG tablet Take 1 tablet (80 mg total) by mouth daily.    [DISCONTINUED] diclofenac (VOLTAREN) 75 MG EC tablet Take 1 tablet (75 mg total) by mouth 2 (two) times daily.   [DISCONTINUED] semaglutide-weight management (WEGOVY) 0.25 MG/0.5ML SOAJ SQ injection Inject 0.25 mg into the skin once a week.   [DISCONTINUED] semaglutide-weight management (WEGOVY) 0.5 MG/0.5ML SOAJ SQ injection Inject 0.5 mg into the skin once a week.   No facility-administered encounter medications on file as of 03/17/2024.   Hearing/Vision screen Vision  Screening   Right eye Left eye Both eyes  Without correction     With correction 20/20 20/20 20/20    Immunizations and Health Maintenance Health Maintenance  Topic Date Due   Mammogram  04/22/2024   DTaP/Tdap/Td (1 - Tdap) 05/13/2024 (Originally 04/03/1985)   Pneumococcal Vaccine: 50+ Years (1 of 1 - PCV) 12/27/2024 (Originally 04/03/2016)   Hepatitis B Vaccines 19-59 Average Risk (1 of 3 - 19+ 3-dose series) 02/21/2025 (Originally [birth year]03/1985)   COVID-19 Vaccine (1) 03/09/2025 (Originally [birth year]04/1971)   Zoster Vaccines- Shingrix (1 of 2) 03/29/2025 (Originally 04/03/1985)   Medicare Annual Wellness (AWV)  03/17/2025   Colonoscopy  03/14/2027   Influenza Vaccine  Completed   Hepatitis C Screening  Completed   HIV Screening  Completed   HPV VACCINES  Aged Out   Meningococcal B Vaccine  Aged Out        Assessment/Plan:  This is a routine wellness examination for [facility].  Patient Care Team: Joesph Annabella HERO, FNP as PCP - General (Family Medicine) Mallipeddi, Diannah SQUIBB, MD as PCP - Cardiology (Cardiology) Harvey Margo CROME, MD (Inactive) as Consulting Physician (Gastroenterology)  I have personally reviewed and noted the following in the patient's chart:   Medical and social history Use of alcohol, tobacco or illicit drugs  Current medications and supplements including opioid prescriptions. Functional ability and status Nutritional status Physical activity Advanced directives List of other  physicians Hospitalizations, surgeries, and ER visits in previous 12 months Vitals Screenings to include cognitive, depression, and falls Referrals and appointments  Jeffrey was seen today for medicare wellness.  Diagnoses and all orders for this visit:  Encounter for Medicare annual wellness exam  Pain, dental Use sparingly due to hx of CVA, HTN.  -     ibuprofen  (ADVIL ) 800 MG tablet; Take 1 tablet (800 mg total) by mouth every 8 (eight) hours as needed.  History of CVA with residual deficit Class 1 obesity due to excess calories with serious comorbidity and body mass index (BMI) of 31.0 to 31.9 in adult Follow a heart healthy diet that is well balanced. Increase physical activity to 30 mins daily.  -     semaglutide-weight management (WEGOVY) 0.25 MG/0.5ML SOAJ SQ injection; Inject 0.25 mg into the skin once a week.    In addition, I have reviewed and discussed with patient certain preventive protocols, quality metrics, and best practice recommendations. A written personalized care plan for preventive services as well as general preventive health recommendations were provided to patient.   Annabella HERO Joesph, FNP   03/17/2024   Return for schedule mammo after 12/9.

## 2024-03-17 NOTE — Discharge Instructions (Signed)
 Please follow-up closely with your primary care doctor on an outpatient basis.  Please continue to monitor your blood pressure at home.  Return to the emergency department immediately for any new or worsening symptoms.

## 2024-03-17 NOTE — ED Triage Notes (Signed)
 Pt to ED from home with c/o upper left dental pain. Pt took ibuprofen  and tylenol  PTA, pain still persists

## 2024-03-19 ENCOUNTER — Other Ambulatory Visit (HOSPITAL_COMMUNITY): Payer: Self-pay

## 2024-03-19 ENCOUNTER — Telehealth: Payer: Self-pay | Admitting: Pharmacy Technician

## 2024-03-19 MED FILL — Oxycodone w/ Acetaminophen Tab 5-325 MG: ORAL | Qty: 6 | Status: AC

## 2024-03-19 NOTE — Telephone Encounter (Signed)
 Noted

## 2024-03-19 NOTE — Telephone Encounter (Signed)
 Pharmacy Patient Advocate Encounter   Received notification from Onbase that prior authorization for [facility] 0.25MG /0.5ML auto-injectors is required/requested.   Insurance verification completed.   The patient is insured through [facility] MEDICARE PART D.   Per test claim: PA required; PA submitted to above mentioned insurance via Latent Key/confirmation #/EOC [id number] Status is pending

## 2024-03-19 NOTE — Telephone Encounter (Signed)
 Pharmacy Patient Advocate Encounter  Received notification from [facility] MEDICARE that Prior Authorization for [facility] 0.25MG /0.5ML auto-injectors has been DENIED.  Full denial letter will be uploaded to the media tab. See denial reason below.    PA #/Case ID/Reference #: [id number]  **We are appealing this decision because we are using her history of CVA as primary need for wegovy, please keep in mind that appeals can take up to 30 days for a decision**

## 2024-04-03 ENCOUNTER — Other Ambulatory Visit: Payer: Self-pay

## 2024-04-03 NOTE — Progress Notes (Signed)
 Patient appearing on report for [employer] - Hypertension Control report due to last documented ambulatory blood pressure of 169/91 on 03/14/2024. Next appointment with PCP is 06/20/2024.   Outreached patient to discuss hypertension control and medication management.   Current antihypertensives: telmisartan  80 mg daily  Patient has an automated upper arm home BP machine.  Current blood pressure readings: 134/62, HR 72 Patient denies hypotensive signs and symptoms including dizziness, lightheadedness.  Patient denies hypertensive symptoms including headache, chest pain, shortness of breath.   Assessment/Plan: - Currently controlled, according to quality goal of <140/90 - - Reviewed appropriate administration of medication regimen - Reviewed appropriate home BP monitoring technique (avoid caffeine, smoking, and exercise for 30 minutes before checking, rest for at least 5 minutes before taking BP, sit with feet flat on the floor and back against a hard surface, uncross legs, and rest arm on flat surface) - Recommend to continue telmisartan  80 mg daily  Woodie Jock, PharmD PGY1 Pharmacy Resident  04/03/2024

## 2024-04-11 ENCOUNTER — Other Ambulatory Visit: Payer: Self-pay | Admitting: Family Medicine

## 2024-05-05 ENCOUNTER — Encounter

## 2024-05-16 ENCOUNTER — Other Ambulatory Visit: Payer: Self-pay | Admitting: Family Medicine

## 2024-05-16 DIAGNOSIS — Z1231 Encounter for screening mammogram for malignant neoplasm of breast: Secondary | ICD-10-CM

## 2024-05-19 ENCOUNTER — Other Ambulatory Visit: Payer: Self-pay | Admitting: *Deleted

## 2024-05-19 ENCOUNTER — Inpatient Hospital Stay: Admission: RE | Admit: 2024-05-19 | Source: Ambulatory Visit

## 2024-05-19 MED ORDER — TELMISARTAN 80 MG PO TABS: 80.0000 mg | ORAL_TABLET | Freq: Every day | ORAL | 0 refills | Status: AC

## 2024-06-19 ENCOUNTER — Ambulatory Visit
Admission: RE | Admit: 2024-06-19 | Discharge: 2024-06-19 | Disposition: A | Discharge status: Home / Self Care | Source: Ambulatory Visit

## 2024-06-19 VITALS — BP 168/90 | HR 79 | Temp 97.8°F | Resp 18

## 2024-06-19 DIAGNOSIS — R0981 Nasal congestion: Secondary | ICD-10-CM

## 2024-06-19 DIAGNOSIS — J3489 Other specified disorders of nose and nasal sinuses: Secondary | ICD-10-CM

## 2024-06-19 DIAGNOSIS — B9789 Other viral agents as the cause of diseases classified elsewhere: Secondary | ICD-10-CM

## 2024-06-19 DIAGNOSIS — J019 Acute sinusitis, unspecified: Secondary | ICD-10-CM | POA: Diagnosis not present

## 2024-06-19 MED ORDER — PREDNISONE 20 MG PO TABS: 40.0000 mg | ORAL_TABLET | Freq: Every day | ORAL | 0 refills | Status: AC

## 2024-06-19 NOTE — ED Triage Notes (Signed)
 Pt reports she has facial pressure, headache, and nasal congestion x 5 days   Took tylenol 

## 2024-06-19 NOTE — ED Provider Notes (Signed)
 " [facility]    CSN: [id number] Arrival date & time: 06/19/24  1447      History   Chief Complaint Chief Complaint  Patient presents with   Nasal Congestion    Entered by patient    HPI Erika Ruiz is a 59 y.o. female.   Patient presents with nasal congestion, sneezing, sinus pressure, facial pain, and intermittent headache that began on 1/31.  Denies cough, chest pain, shortness of breath, nausea, vomiting, diarrhea, abdominal pain, and fever.  Patient reports that she has been taking Tylenol  and using nasal spray with minimal relief.  Patient denies any known sick exposures.  The history is provided by the patient and medical records.    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) 11/09/2022   Prediabetes 11/09/2022   Class 1 obesity due to excess calories with serious comorbidity and body mass index (BMI) of 31.0 to 31.9 in adult 09/22/2022   Anxiety 09/22/2022   Vitamin D  deficiency 09/22/2022   History of CVA with residual deficit 07/25/2022   Primary hypertension 09/16/2020   Chronic back pain 09/16/2020   GERD (gastroesophageal reflux disease) 09/16/2020   Hyperlipidemia 09/16/2020   Dyspepsia    Family history of colon cancer 02/14/2017   Abdominal pain, epigastric 07/11/2012   Abdominal pain, left upper quadrant 07/11/2012   Family history of malignant neoplasm of gastrointestinal tract 07/11/2012    Past Surgical History:  Procedure Laterality Date   ABDOMINAL HYSTERECTOMY     due to CA   BIOPSY  03/13/2017   Procedure: BIOPSY;  Surgeon: Harvey Margo CROME, MD;  [facility];  Service: Endoscopy;;  gastric   BREAST EXCISIONAL BIOPSY Left    BREAST LUMPECTOMY Left    COLONOSCOPY WITH PROPOFOL  N/A 03/13/2017   Procedure: COLONOSCOPY WITH PROPOFOL ;  Surgeon: Harvey Margo CROME, MD;  [facility];  Service: Endoscopy;  Laterality: N/A;  11:00am   ESOPHAGOGASTRODUODENOSCOPY (EGD) WITH PROPOFOL  N/A 03/13/2017   Procedure: ESOPHAGOGASTRODUODENOSCOPY (EGD) WITH PROPOFOL ;  Surgeon: Harvey Margo CROME, MD;  [facility];  Service: Endoscopy;  Laterality: N/A;   LEFT OOPHORECTOMY     POLYPECTOMY  03/13/2017   Procedure: POLYPECTOMY;  Surgeon: Harvey Margo CROME, MD;  [facility];  Service: Endoscopy;;  colon   VAGINAL DELIVERY     X2    OB History   No obstetric history on file.      Home Medications    Prior to Admission medications  Medication Sig Start Date End Date Taking? Authorizing Provider  predniSONE  (DELTASONE ) 20 MG tablet Take 2 tablets (40 mg total) by mouth daily for 5 days. 06/19/24 06/24/24 Yes Johnie Flaming A, NP  aspirin  EC 81 MG EC tablet Take 1 tablet (81 mg total) by mouth daily. Swallow whole. X 90 days 09/18/20   Evonnie Lenis, MD  baclofen  (LIORESAL ) 10 MG tablet Take 1 tablet (10 mg total) by mouth 3 (three) times daily. 03/14/24   Lavell Bari LABOR, FNP  cetirizine (ZYRTEC) 10 MG chewable tablet Chew 10 mg by  mouth daily.    [provider]  cholecalciferol (VITAMIN D3) 25 MCG (1000 UNIT) tablet Take 1,000 Units by mouth daily.    [provider]  cyanocobalamin  (VITAMIN B12) 1000 MCG tablet Take 1,000 mcg by mouth daily.    [provider]  ezetimibe  (ZETIA ) 10 MG tablet TAKE ONE (1) TABLET BY MOUTH EVERY DAY 04/14/24   Joesph Annabella HERO, FNP  ibuprofen  (ADVIL ) 800 MG tablet Take 1 tablet (800 mg total) by mouth every 8 (eight) hours as needed. 03/17/24   Joesph Annabella HERO, FNP  meclizine  (ANTIVERT ) 25 MG tablet Take 1 tablet (25 mg total) by mouth 3 (three) times daily as needed for  dizziness. 11/20/23   Daralene Lonni BIRCH, PA-C  telmisartan  (MICARDIS ) 80 MG tablet Take 1 tablet (80 mg total) by mouth daily. 05/19/24   Joesph Annabella HERO, FNP    Family History Family History  Problem Relation Age of Onset   Breast cancer Mother    Heart disease Mother    Diabetes Father    Heart disease Father    Kidney disease Father    Colon cancer Sister 55   Heart disease Sister    Colon polyps Brother    Ovarian cancer Paternal Aunt 78    Social History Social History[1]   Allergies   Sulfa antibiotics   Review of Systems Review of Systems  Per HPI  Physical Exam Triage Vital Signs ED Triage Vitals  Encounter Vitals Group     BP 06/19/24 1451 (!) 168/90     Girls Systolic BP Percentile --      Girls Diastolic BP Percentile --      Boys Systolic BP Percentile --      Boys Diastolic BP Percentile --      Pulse Rate 06/19/24 1451 79     Resp 06/19/24 1451 18     Temp 06/19/24 1451 97.8 F (36.6 C)     Temp Source 06/19/24 1451 Oral     SpO2 06/19/24 1451 95 %     Weight --      Height --      Head Circumference --      Peak Flow --      Pain Score 06/19/24 1449 3     Pain Loc --      Pain Education --      Exclude from Growth Chart --    No data found.  Updated Vital Signs BP (!) 168/90   Pulse 79   Temp 97.8 F (36.6 C) (Oral)   Resp 18   SpO2 95%   Visual Acuity Right Eye Distance:   Left Eye Distance:   Bilateral Distance:    Right Eye Near:   Left Eye Near:    Bilateral Near:     Physical Exam Vitals and nursing note reviewed.  Constitutional:      General: She is awake. She is not in acute distress.    Appearance: Normal appearance. She is well-developed and well-groomed. She is not ill-appearing.  HENT:     Right Ear: Tympanic membrane, ear canal and external ear normal.     Left Ear: Tympanic membrane, ear canal and external ear normal.     Nose: Congestion and rhinorrhea present.     Right Sinus: Maxillary sinus  tenderness present. No frontal sinus tenderness.     Left Sinus: Maxillary sinus tenderness present. No frontal sinus tenderness.     Mouth/Throat:     Mouth: Mucous membranes  are moist.     Pharynx: Posterior oropharyngeal erythema present. No oropharyngeal exudate or postnasal drip.  Cardiovascular:     Rate and Rhythm: Normal rate and regular rhythm.  Pulmonary:     Effort: Pulmonary effort is normal.     Breath sounds: Normal breath sounds.  Skin:    General: Skin is warm and dry.  Neurological:     General: No focal deficit present.     Mental Status: She is alert and oriented to person, place, and time. Mental status is at baseline.  Psychiatric:        Behavior: Behavior is cooperative.      UC Treatments / Results  Labs (all labs ordered are listed, but only abnormal results are displayed) Labs Reviewed - No data to display  EKG   Radiology No results found.  Procedures Procedures (including critical care time)  Medications Ordered in UC Medications - No data to display  Initial Impression / Assessment and Plan / UC Course  I have reviewed the triage vital signs and the nursing notes.  Pertinent labs & imaging results that were available during my care of the patient were reviewed by me and considered in my medical decision making (see chart for details).     Patient is overall well-appearing.  Vitals are stable.  Deferred COVID and flu testing due to duration of symptoms.  Suspect symptoms likely related to a viral sinusitis.  Prescribed short course of prednisone  to help with inflammation related to this.  Recommended continue with nasal spray and taking over-the-counter Coricidin for congestion.  Discussed follow-up and return precautions. Final Clinical Impressions(s) / UC Diagnoses   Final diagnoses:  Acute viral sinusitis  Nasal congestion  Sinus pressure     Discharge Instructions      As discussed I believe your symptoms are likely related to  a viral sinusitis.  This does not require any antibiotics at this time. Start taking 2 tablets of prednisone  once daily for 5 days to help with inflammation related to this. You can continue using nasal spray to help with nasal congestion. I also recommend over-the-counter Coricidin to help with congestion.  This medication is safe with your history of high blood pressure. Follow-up with your primary care provider or return here as needed.   ED Prescriptions     Medication Sig Dispense Auth. Provider   predniSONE  (DELTASONE ) 20 MG tablet Take 2 tablets (40 mg total) by mouth daily for 5 days. 10 tablet Johnie Flaming A, NP      PDMP not reviewed this encounter.    [1]  Social History Tobacco Use   Smoking status: Never    Passive exposure: Never   Smokeless tobacco: Never  Vaping Use   Vaping status: Never Used  Substance Use Topics   Alcohol use: No    Alcohol/week: 0.0 standard drinks of alcohol   Drug use: No     Johnie Flaming A, NP 06/19/24 1505  "

## 2024-06-19 NOTE — Discharge Instructions (Signed)
 As discussed I believe your symptoms are likely related to a viral sinusitis.  This does not require any antibiotics at this time. Start taking 2 tablets of prednisone  once daily for 5 days to help with inflammation related to this. You can continue using nasal spray to help with nasal congestion. I also recommend over-the-counter Coricidin to help with congestion.  This medication is safe with your history of high blood pressure. Follow-up with your primary care provider or return here as needed.

## 2024-06-23 ENCOUNTER — Ambulatory Visit: Admitting: Dermatology

## 2024-06-26 ENCOUNTER — Ambulatory Visit: Admitting: Dermatology

## 2024-06-30 ENCOUNTER — Ambulatory Visit: Payer: Self-pay | Admitting: Family Medicine
